# Patient Record
Sex: Female | Born: 1935 | Race: Black or African American | Hispanic: No | Smoking: Never smoker
Health system: Southern US, Community
[De-identification: ages and names within clinical notes are randomized; demographics above are authoritative.]

## PROBLEM LIST (undated history)

## (undated) DIAGNOSIS — E559 Vitamin D deficiency, unspecified: Secondary | ICD-10-CM

## (undated) DIAGNOSIS — K219 Gastro-esophageal reflux disease without esophagitis: Secondary | ICD-10-CM

## (undated) DIAGNOSIS — T8859XA Other complications of anesthesia, initial encounter: Secondary | ICD-10-CM

## (undated) DIAGNOSIS — I951 Orthostatic hypotension: Secondary | ICD-10-CM

## (undated) DIAGNOSIS — Z8639 Personal history of other endocrine, nutritional and metabolic disease: Secondary | ICD-10-CM

## (undated) DIAGNOSIS — I10 Essential (primary) hypertension: Secondary | ICD-10-CM

## (undated) DIAGNOSIS — D691 Qualitative platelet defects: Secondary | ICD-10-CM

## (undated) DIAGNOSIS — R634 Abnormal weight loss: Secondary | ICD-10-CM

## (undated) DIAGNOSIS — T4145XA Adverse effect of unspecified anesthetic, initial encounter: Secondary | ICD-10-CM

## (undated) DIAGNOSIS — K4090 Unilateral inguinal hernia, without obstruction or gangrene, not specified as recurrent: Secondary | ICD-10-CM

## (undated) DIAGNOSIS — R112 Nausea with vomiting, unspecified: Secondary | ICD-10-CM

## (undated) DIAGNOSIS — R413 Other amnesia: Secondary | ICD-10-CM

## (undated) DIAGNOSIS — Z9889 Other specified postprocedural states: Secondary | ICD-10-CM

## (undated) DIAGNOSIS — L089 Local infection of the skin and subcutaneous tissue, unspecified: Secondary | ICD-10-CM

## (undated) DIAGNOSIS — G250 Essential tremor: Secondary | ICD-10-CM

## (undated) DIAGNOSIS — D649 Anemia, unspecified: Secondary | ICD-10-CM

## (undated) DIAGNOSIS — M1990 Unspecified osteoarthritis, unspecified site: Secondary | ICD-10-CM

## (undated) DIAGNOSIS — D573 Sickle-cell trait: Secondary | ICD-10-CM

## (undated) HISTORY — DX: Abnormal weight loss: R63.4

## (undated) HISTORY — PX: BLADDER SUSPENSION: SHX72

## (undated) HISTORY — PX: JOINT REPLACEMENT: SHX530

## (undated) HISTORY — DX: Sickle-cell trait: D57.3

## (undated) HISTORY — DX: Personal history of other endocrine, nutritional and metabolic disease: Z86.39

## (undated) HISTORY — DX: Essential tremor: G25.0

## (undated) HISTORY — DX: Orthostatic hypotension: I95.1

## (undated) HISTORY — DX: Anemia, unspecified: D64.9

## (undated) HISTORY — DX: Other amnesia: R41.3

## (undated) HISTORY — DX: Local infection of the skin and subcutaneous tissue, unspecified: L08.9

## (undated) HISTORY — PX: EYE SURGERY: SHX253

## (undated) HISTORY — PX: BUNIONECTOMY: SHX129

## (undated) HISTORY — DX: Essential (primary) hypertension: I10

## (undated) HISTORY — PX: ABDOMINAL HYSTERECTOMY: SHX81

## (undated) HISTORY — DX: Vitamin D deficiency, unspecified: E55.9

## (undated) HISTORY — DX: Qualitative platelet defects: D69.1

---

## 2013-09-16 ENCOUNTER — Ambulatory Visit: Payer: Self-pay

## 2013-10-03 ENCOUNTER — Ambulatory Visit (INDEPENDENT_AMBULATORY_CARE_PROVIDER_SITE_OTHER): Payer: BC Managed Care – PPO

## 2013-10-03 VITALS — BP 156/96 | HR 67 | Resp 12 | Ht 66.0 in | Wt 150.0 lb

## 2013-10-03 DIAGNOSIS — B351 Tinea unguium: Secondary | ICD-10-CM

## 2013-10-03 DIAGNOSIS — M2040 Other hammer toe(s) (acquired), unspecified foot: Secondary | ICD-10-CM

## 2013-10-03 DIAGNOSIS — M79609 Pain in unspecified limb: Secondary | ICD-10-CM

## 2013-10-03 NOTE — Progress Notes (Signed)
Subjective:    Patient ID: Sheri Brown, female    DOB: 1936/10/23, 77 y.o.   MRN: [id number]  HPI Comments: '' TRIM TOENAILS AND CHECK MY FEET''     Review of Systems  Endocrine: Positive for cold intolerance.  Genitourinary: Positive for frequency.  Musculoskeletal: Positive for back pain.  Psychiatric/Behavioral: The patient is nervous/anxious.        Objective:   Physical Exam Neurovascular status is intact pedal pulses palpable DP postal for PT one over 4 bilateral Refill time 3 seconds all digits neurologically epicritic and proprioceptive sensations intact and symmetric bilateral normal plantar response and DTRs noted. Dermatologically skin color pigment normal hair growth absent bilateral. Nails extremely thick brittle and friable and discolored nails have not been cut possibly as much as a year to two-year is there over an inch in length brittle friable and criptotic and at she painful and tender both on palpation and attempted debridement at this time. Hallux through fifth digits bilateral consistent with severe onychomycosis and crepitus is ingrowing nails painful tender and deformed. Patient start these had bunionectomy both great toes over has residual lateral deviation of the hallux mild flexible digital contractures noted 2 through 5 bilateral       Assessment & Plan:  Assessment this time is significant onychomycosis and deformity of nails. Painful in systematic 1 through 5 bilateral. Nails painful cement debrided x10 recommend topical antifungal such as formula 3 to be applied twice daily to the affected nails for 12 months duration. Reappointed 3 months for continued palliative care as recommended patient's will also maintain appropriate coming shoe gear in the future. No secondary infection is noted at this time  Alvan Dame DPM

## 2013-10-03 NOTE — Patient Instructions (Signed)
Onychomycosis/Fungal Toenails  WHAT IS IT? An infection that lies within the keratin of your nail plate that is caused by a fungus.  WHY ME? Fungal infections affect all ages, sexes, races, and creeds.  There may be many factors that predispose you to a fungal infection such as age, coexisting medical conditions such as diabetes, or an autoimmune disease; stress, medications, fatigue, genetics, etc.  Bottom line: fungus thrives in a warm, moist environment and your shoes offer such a location.  IS IT CONTAGIOUS? Theoretically, yes.  You do not want to share shoes, nail clippers or files with someone who has fungal toenails.  Walking around barefoot in the same room or sleeping in the same bed is unlikely to transfer the organism.  It is important to realize, however, that fungus can spread easily from one nail to the next on the same foot.  HOW DO WE TREAT THIS?  There are several ways to treat this condition.  Treatment may depend on many factors such as age, medications, pregnancy, liver and kidney conditions, etc.  It is best to ask your doctor which options are available to you.  1. No treatment.   Unlike many other medical concerns, you can live with this condition.  However for many people this can be a painful condition and may lead to ingrown toenails or a bacterial infection.  It is recommended that you keep the nails cut short to help reduce the amount of fungal nail. 2. Topical treatment.  These range from herbal remedies to prescription strength nail lacquers.  About 40-50% effective, topicals require twice daily application for approximately 9 to 12 months or until an entirely new nail has grown out.  The most effective topicals are medical grade medications available through physicians offices. 3. Oral antifungal medications.  With an 80-90% cure rate, the most common oral medication requires 3 to 4 months of therapy and stays in your system for a year as the new nail grows out.  Oral  antifungal medications do require blood work to make sure it is a safe drug for you.  A liver function panel will be performed prior to starting the medication and after the first month of treatment.  It is important to have the blood work performed to avoid any harmful side effects.  In general, this medication safe but blood work is required. 4. Laser Therapy.  This treatment is performed by applying a specialized laser to the affected nail plate.  This therapy is noninvasive, fast, and non-painful.  It is not covered by insurance and is therefore, out of pocket.  The results have been very good with a 80-95% cure rate.  The [facility] is the only practice in the area to offer this therapy. 5. Permanent Nail Avulsion.  Removing the entire nail so that a new nail will not grow back. 6. Fungi-Nail to be obtained over-the-counter. Apply twice daily to affected nails for a 12 months. Minimum 7. Formula 3 as an alternative that can be purchased at the office [facility] also to be applied twice daily to the affected nails for 12 months.

## 2013-10-14 DIAGNOSIS — B351 Tinea unguium: Secondary | ICD-10-CM

## 2013-12-12 ENCOUNTER — Ambulatory Visit: Payer: Medicare Other

## 2014-10-30 ENCOUNTER — Telehealth: Payer: Self-pay | Admitting: *Deleted

## 2014-10-30 NOTE — Telephone Encounter (Signed)
Patient called and wanted someone to call her back.

## 2014-11-05 NOTE — Telephone Encounter (Signed)
Left message to call back. Sheri StanleyLisa cox

## 2015-02-16 ENCOUNTER — Ambulatory Visit: Payer: Medicare Other

## 2015-03-11 ENCOUNTER — Ambulatory Visit: Payer: Medicare Other | Admitting: Podiatrist

## 2015-03-12 ENCOUNTER — Ambulatory Visit: Payer: Medicare Other

## 2015-03-12 DIAGNOSIS — B351 Tinea unguium: Secondary | ICD-10-CM

## 2015-03-12 DIAGNOSIS — M79673 Pain in unspecified foot: Secondary | ICD-10-CM

## 2015-03-12 NOTE — Progress Notes (Signed)
Subjective:    Patient ID: Sheri Brown, female    DOB: 17-Jun-1936, 79 y.o.   MRN: [id number]  HPI Comments: '' TRIM TOENAILS AND CHECK MY FEET''     Review of Systems  Endocrine: Positive for cold intolerance.  Genitourinary: Positive for frequency.  Musculoskeletal: Positive for back pain.  Psychiatric/Behavioral: The patient is nervous/anxious.        Objective:   Physical Exam Neurovascular status is intact pedal pulses palpable DP postal for PT one over 4 bilateral Refill time 3 seconds all digits neurologically epicritic and proprioceptive sensations intact and symmetric bilateral normal plantar response and DTRs noted. Dermatologically skin color pigment normal hair growth absent bilateral. Nails extremely thick brittle and friable and discolored nails have not been cut possibly as much as a year to two-year is there over an inch in length brittle friable and criptotic and at she painful and tender both on palpation and attempted debridement at this time. Hallux through fifth digits bilateral consistent with severe onychomycosis and crepitus is ingrowing nails painful tender and deformed. Patient start these had bunionectomy both great toes over has residual lateral deviation of the hallux mild flexible digital contractures noted 2 through 5 bilateral       Assessment & Plan:  Assessment this time is significant onychomycosis and deformity of nails. Painful in systematic 1 through 5 bilateral. Nails painful cement debrided x10 recommend topical antifungal such as formula 3 to be applied twice daily to the affected nails for 12 months duration. Reappointed 3 months for continued palliative care as recommended patient's will also maintain appropriate coming shoe gear in the future. No secondary infection is noted at this time

## 2015-06-24 ENCOUNTER — Ambulatory Visit: Payer: Medicare Other | Admitting: Podiatry

## 2015-08-31 ENCOUNTER — Ambulatory Visit: Payer: Medicare Other | Admitting: Podiatry

## 2015-09-07 ENCOUNTER — Ambulatory Visit: Payer: Medicare Other | Admitting: Podiatry

## 2015-09-10 ENCOUNTER — Ambulatory Visit (INDEPENDENT_AMBULATORY_CARE_PROVIDER_SITE_OTHER): Payer: Medicare Other | Admitting: Podiatry

## 2015-09-10 ENCOUNTER — Encounter: Payer: Self-pay | Admitting: Podiatry

## 2015-09-10 DIAGNOSIS — L84 Corns and callosities: Secondary | ICD-10-CM | POA: Diagnosis not present

## 2015-09-10 DIAGNOSIS — B351 Tinea unguium: Secondary | ICD-10-CM | POA: Diagnosis not present

## 2015-09-10 DIAGNOSIS — M79673 Pain in unspecified foot: Secondary | ICD-10-CM

## 2015-09-10 NOTE — Progress Notes (Signed)
Patient ID: Bernerd LimboLottie Cashman, female   DOB: [birth year], 79 y.o.   MRN: [id number] Complaint:  Visit Type: Patient returns to my office for continued preventative foot care services. Complaint: Patient states" my nails have grown long and thick and become painful to walk and wear shoes" . The patient presents for preventative foot care services. No changes to ROS.  She has painful corn on the tip of her second toe right foot.  Podiatric Exam: Vascular: dorsalis pedis and posterior tibial pulses are palpable bilateral. Capillary return is immediate. Temperature gradient is WNL. Skin turgor WNL  Sensorium: Normal Semmes Weinstein monofilament test. Normal tactile sensation bilaterally. Nail Exam: Pt has thick disfigured discolored nails with subungual debris noted bilateral entire nail hallux through fifth toenails Ulcer Exam: There is no evidence of ulcer or pre-ulcerative changes or infection. Orthopedic Exam: Muscle tone and strength are WNL. No limitations in general ROM. No crepitus or effusions noted. Foot type and digits show no abnormalities. Bony prominences are unremarkable. Skin: No Porokeratosis. No infection or ulcers.  Distal clavi second toe right foot.  Diagnosis:  Onychomycosis, , Pain in right toe, pain in left toes. Clavi  Treatment & Plan Procedures and Treatment: Consent by patient was obtained for treatment procedures. The patient understood the discussion of treatment and procedures well. All questions were answered thoroughly reviewed. Debridement of mycotic and hypertrophic toenails, 1 through 5 bilateral and clearing of subungual debris. No ulceration, no infection noted. Debride clavi.   Patient states that she has sensitive feet due to previous bunion surgery both feet.  She was very vocally responsive every time her nails were debrided and asked me not to use the dremel. She also has not been seen for six months. Return Visit-Office Procedure: Patient instructed to return to the  office for a follow up visit 3 months for continued evaluation and treatment.

## 2015-12-01 ENCOUNTER — Ambulatory Visit: Payer: Medicare Other | Admitting: Podiatry

## 2015-12-08 ENCOUNTER — Ambulatory Visit (INDEPENDENT_AMBULATORY_CARE_PROVIDER_SITE_OTHER): Payer: Medicare Other | Admitting: Podiatry

## 2015-12-08 ENCOUNTER — Encounter: Payer: Self-pay | Admitting: Podiatry

## 2015-12-08 DIAGNOSIS — B351 Tinea unguium: Secondary | ICD-10-CM

## 2015-12-08 DIAGNOSIS — M79673 Pain in unspecified foot: Secondary | ICD-10-CM

## 2015-12-08 NOTE — Progress Notes (Signed)
Subjective:     Patient ID: Sheri Brown, female   DOB: 07-23-1936, 80 y.o.   MRN: [id number]  HPI this patient returns to the office for continued preventative foot care services. Her nails are extremely long, thick and painful. She experiences pain walking and wearing her shoes. She has had a history of bilateral bunion correction. She says she would like her nails cut without the use of the dremel tool.     Review of Systems     Objective:   Physical Exam Podiatric Exam: Vascular: dorsalis pedis and posterior tibial pulses are palpable bilateral. Capillary return is immediate. Temperature gradient is WNL. Skin turgor WNL  Sensorium: Normal Semmes Weinstein monofilament test. Normal tactile sensation bilaterally. Nail Exam: Pt has thick disfigured discolored nails with subungual debris noted bilateral entire nail hallux through fifth toenails Ulcer Exam: There is no evidence of ulcer or pre-ulcerative changes or infection. Orthopedic Exam: Muscle tone and strength are WNL. No limitations in general ROM. No crepitus or effusions noted. Foot type and digits show no abnormalities. Bony prominences are unremarkable. Skin: No Porokeratosis. No infection or ulcers. Distal clavi second toe right foot.     Assessment:     Onychomycosis B/L     Plan:     ROV  Discussed her nail with her.  I told her that without the use of the dremel tool I would not be able to do a good job.  I suggested she consider permanent removal of her mycotic nails and she thought that was a good idea.  I will seek a doctor to perform this surgery.  Helane Gunther DPM

## 2016-01-17 ENCOUNTER — Encounter: Payer: Self-pay | Admitting: Sports Medicine

## 2016-01-17 ENCOUNTER — Ambulatory Visit (INDEPENDENT_AMBULATORY_CARE_PROVIDER_SITE_OTHER): Payer: Medicare Other | Admitting: Sports Medicine

## 2016-01-17 DIAGNOSIS — M2040 Other hammer toe(s) (acquired), unspecified foot: Secondary | ICD-10-CM

## 2016-01-17 DIAGNOSIS — L84 Corns and callosities: Secondary | ICD-10-CM | POA: Diagnosis not present

## 2016-01-17 DIAGNOSIS — B351 Tinea unguium: Secondary | ICD-10-CM

## 2016-01-17 DIAGNOSIS — M21619 Bunion of unspecified foot: Secondary | ICD-10-CM

## 2016-01-17 DIAGNOSIS — M79673 Pain in unspecified foot: Secondary | ICD-10-CM

## 2016-01-17 NOTE — Progress Notes (Signed)
Patient ID: Sheri Brown, female   DOB: 1936-07-19, 80 y.o.   MRN: [id number] Subjective: Sheri Brown is a 80 y.o. female patient seen today in office with complaint of painful thickened and elongated toenails; unable to trim. Patient denies history of Diabetes, Neuropathy, or known Vascular disease. Patient states that she wants to discuss her nails; states that during last office visit the last doctor she saw was very dismissive and did not want to take the time to trim her nails because she was very sensitive; patient states that she does NOT want surgery. States her nails are only painful as they grow out and would like conservative treatment only. Patient has no other pedal complaints at this time.   There are no active problems to display for this patient.   Current Outpatient Prescriptions on File Prior to Visit  Medication Sig Dispense Refill  . metoprolol tartrate (LOPRESSOR) 25 MG tablet Take 25 mg by mouth 2 (two) times daily.     No current facility-administered medications on file prior to visit.    Allergies  Allergen Reactions  . Gluten Meal     Objective: Physical Exam  General: Well developed, nourished, no acute distress, awake, alert and oriented x 3  Vascular: Dorsalis pedis artery 2/4 bilateral, Posterior tibial artery 1/4 bilateral, skin temperature warm to warm proximal to distal bilateral lower extremities, no varicosities, pedal hair present bilateral.  Neurological: Gross sensation present via light touch bilateral.   Dermatological: Skin is warm, dry, and supple bilateral, Nails 1-10 are tender, long, thick, and discolored with moderate to severe subungal debris and discoloration, no webspace macerations present bilateral, no open lesions present bilateral, + mild hyperkeratotic tissue present at right 2nd toe distal tuft. No signs of infection bilateral.  Musculoskeletal: Hammertoes and Residual bunion deformities noted bilateral; had bunionectomy 20 years ago.  Muscular strength within normal limits without pain on range of motion. No pain with calf compression bilateral.  Assessment and Plan:  Problem List Items Addressed This Visit    None    Visit Diagnoses    Onychomycosis    -  Primary    Clavi        right 2nd toe    Foot pain, unspecified laterality        Bunion        Hammer toe, unspecified laterality           -Examined patient.  -Discussed treatment options for painful mycotic nails and clavi. -Recommend good supportive shoes and inserts for foot type and padding to right 2nd toe as needed -Patient does NOT want surgery at this time. States that she only wants her nails trimmed however wants her next nail trim to be in a few weeks not at this visit as offered -Patient is interested in nail softners; recommended epsom salt soaks and vicks/tea tree oil as needed to nails -Patient to return when she is ready for nail trim or sooner if symptoms worsen.  [name], DPM

## 2016-03-06 DIAGNOSIS — I10 Essential (primary) hypertension: Secondary | ICD-10-CM

## 2016-03-06 DIAGNOSIS — D649 Anemia, unspecified: Secondary | ICD-10-CM | POA: Insufficient documentation

## 2016-03-06 DIAGNOSIS — R42 Dizziness and giddiness: Secondary | ICD-10-CM | POA: Insufficient documentation

## 2016-03-06 DIAGNOSIS — Z8639 Personal history of other endocrine, nutritional and metabolic disease: Secondary | ICD-10-CM | POA: Insufficient documentation

## 2016-03-06 HISTORY — DX: Essential (primary) hypertension: I10

## 2016-06-12 ENCOUNTER — Other Ambulatory Visit: Payer: Self-pay | Admitting: Cardiology

## 2016-06-12 ENCOUNTER — Ambulatory Visit
Admission: RE | Admit: 2016-06-12 | Discharge: 2016-06-12 | Disposition: A | Discharge status: Home / Self Care | Payer: Medicare Other | Source: Ambulatory Visit | Attending: Cardiology | Admitting: Cardiology

## 2016-06-12 DIAGNOSIS — R0602 Shortness of breath: Secondary | ICD-10-CM

## 2016-08-07 DIAGNOSIS — R0602 Shortness of breath: Secondary | ICD-10-CM | POA: Diagnosis present

## 2016-08-07 NOTE — H&P (Signed)
OFFICE VISIT NOTES COPIED TO EPIC FOR DOCUMENTATION  . History of Present Illness Sheri Brown AGNP-C; 07-13-16 1:36 PM) Patient words: FU nuc, echo, chest xray; Last OV 06/05/2016.  The patient is a 80 year old female who presents for a Follow-up for Shortness of breath. She has a history of hypertension. She presented for evaluation of worsening dyspnea on exertion and lower extremity edema over the past several months. She denies any chest pain, PND, orthopnea, dizziness, syncope, or symptoms suggestive of claudication or TIA. No history of diabetes, hyperlipidemia, thyroid disease, or tobacco use, however she had a significant history of secondhand tobacco exposure.  She was scheduled for echocardiogram and nuclear stress test and presents today for follow up.   Problem List/Past Medical Sheri Brown; 07-13-16 1:00 PM) Benign essential hypertension (I10)  Orthopnea (R06.01)  Shortness of breath on exertion (R06.02)  Chest x-ray 06/12/2016: No active cardiopulmonary disease. Lower extremity edema (R60.0)  Abnormal EKG (R94.31)  Anemia (D64.9)  Sickle cell trait (D57.3)   Allergies Sheri Brown; 07/13/2016 1:00 PM) No Known Drug Allergies 06/05/2016  Family History Sheri Brown; 07-13-2016 1:00 PM) Mother  Deceased. at age 42 from heart attacks; no heart attacks or strokes, no known cardiovascular conditions Father  Deceased. in his 8s from cancer; no heart attacks or strokes, no cardiovascular conditions Brother 1  In good health. 3 1/2 yrs younger; no cardiovascular conditions  Social History Sheri Brown; 07-13-16 1:00 PM) Number of Children  3. Living Situation  Lives alone. Marital status  Widowed. Non Drinker/No Alcohol Use  Current tobacco use  Never smoker.  Past Surgical History Sheri Brown; July 13, 2016 1:00 PM) Nodule Removed from Breast 1961 Wisdom Tooth 1964 Total Hip Replacement - Right 2011 Bilateral  Bunionectomy 2011  Medication History Sheri Brown; 2016/07/13 1:06 PM) Metoprolol Succinate ER (25MG  Tablet ER 24HR, 1/2 Oral daily for tremors) Active. Ferrous Sulfate (325 (65 Fe)MG Tablet, 1 Oral daily) Active. Multivitamin/Iron (1 Oral daily) Active. Medications Reconciled (verbally)  Diagnostic Studies History Sheri Pert, MD; 13-Jul-2016 1:18 PM) Echocardiogram 06/21/2016 1. Left ventricle cavity is normal in size. Normal global wall motion. Normal diastolic filling pattern. Calculated EF 63%. 2. Right atrial cavity is mildly dilated. 3. Right ventricle cavity is borderline dilated. Normal right ventricular function. 4. Mild (Grade I) aortic regurgitation. 5. Mild (Grade I) mitral regurgitation. 6. Mild tricuspid regurgitation. Moderate pulmonary hypertension. PASP 46 mm Hg. 7. IVC is dilated with blunted respiratory response. Suggests elevated central venous pressure Nuclear stress test 06/16/2016 1. The resting electrocardiogram demonstrated normal sinus rhythm, normal resting conduction, no resting arrhythmias and nonspecific T changes. Stress EKG is equivocal for ischemia with Lexiscan infusion, patient developed 1 mm ST segment depression in inferior and lateral leads at 1 minute into recovery.stress symptoms included dizziness. 2. The raw images reveal prominent soft tissue evaluation in the inferior wall. There is no e/o ischemia. The left ventricle is very small both in rest and stress images at 78 mL. The ventricular systolic function calculated by QGS was depressed at 38% however visually appears to be normal. This is an intermediate risk study, clinical correlation recommended. Labwork  05/17/2016: Total cholesterol 121, triglycerides 54, HDL 44, direct LDL 69, creatinine 0.75, potassium 3.8, CMP normal, Hb 10.1/HCT 30.1 with normocytic indices Sleep Study 2011 Normal. Colonoscopy 2011    Review of Systems [facility] Sheri Brown, [place]; 2016-07-13 1:36 PM) General  Not Present- Anorexia, Fatigue and Fever. Respiratory Present- Decreased Exercise Tolerance and Difficulty Breathing on Exertion. Not Present- Cough. Cardiovascular  Present- Edema and Orthopnea. Not Present- Chest Pain, Claudications, Palpitations and Paroxysmal Nocturnal Dyspnea. Gastrointestinal Not Present- Black, Tarry Stool, Change in Bowel Habits and Nausea. Neurological Not Present- Focal Neurological Symptoms and Syncope. Endocrine Not Present- Cold Intolerance, Excessive Sweating, Heat Intolerance and Thyroid Problems. Hematology Not Present- Anemia, Easy Bruising, Petechiae and Prolonged Bleeding.  Vitals Sheri Brown(Sheri Brown; 06/28/2016 1:08 PM) 06/28/2016 1:03 PM Weight: 130.44 lb Height: 63in Body Surface Area: 1.61 m Body Mass Index: 23.11 kg/m  Pulse: 82 (Regular)  P.OX: 97% (Room air) BP: 90/50 (Sitting, Left Arm, Brown)       Physical Exam (Sheri Brown, AGNP-C; 06/28/2016 1:36 PM) General Mental Status-Alert. General Appearance-Cooperative and Appears stated age. Build & Nutrition-Moderately built.  Head and Neck Thyroid Gland Characteristics - normal size and consistency and no palpable nodules.  Chest and Lung Exam Chest and lung exam reveals -quiet, even and easy respiratory effort with no use of accessory muscles, non-tender and on auscultation, normal breath sounds, no adventitious sounds.  Cardiovascular Cardiovascular examination reveals -normal heart sounds, regular rate and rhythm with no murmurs, carotid auscultation reveals no bruits and abdominal aorta auscultation reveals no bruits and no prominent pulsation.  Abdomen Palpation/Percussion Normal exam - Non Tender and No hepatosplenomegaly.  Peripheral Vascular Lower Extremity Inspection - Bilateral - Varicose veins. Palpation - Edema - Bilateral - 1+ Pitting edema. Femoral pulse - Bilateral - Normal. Popliteal pulse - Bilateral - Feeble. Dorsalis pedis pulse - Bilateral -  Normal. Posterior tibial pulse - Bilateral - Feeble.  Neurologic Neurologic evaluation reveals -alert and oriented x 3 with no impairment of recent or remote memory. Motor-Grossly intact without any focal deficits.  Musculoskeletal Global Assessment Left Lower Extremity - no deformities, masses or tenderness, no known fractures. Right Lower Extremity - no deformities, masses or tenderness, no known fractures.    Assessment & Plan (Sheri Allison AGNP-C; 06/28/2016 1:32 PM) Pulmonary hypertension (I27.2) Current Plans Complete electrocardiogram (93000) Shortness of breath on exertion (R06.02) Story: Chest x-ray 06/12/2016: No active cardiopulmonary disease. Impression: EKG 06/05/2016: Sinus rhythm at a rate of 60 bpm, normal axis, normal intervals, diffuse T-wave abnormality, cannot exclude anterior ischemia. Orthopnea (R06.01) Lower extremity edema (R60.0) Current Plans Mechanism of underlying disease process and action of medications discussed with the patient. I discussed primary/secondary prevention. She presents for follow-up of nuclear stress test and echocardiogram due to dyspnea. Stress test was negative for evidence of ischemia, however echocardiogram reveals pulmonary hypertension with RVSP of 46 mmHg. Unable to treat with amlodipine or isosorbide due to baseline hypotension. Will schedule for right heart catheterization for definitive evaluation of right heart pressures and see her back after this for further recommendations.   Signed by Sheri SalvoBridgette Allison, AGNP-C (06/28/2016 1:37 PM)

## 2016-08-08 ENCOUNTER — Ambulatory Visit (HOSPITAL_COMMUNITY)
Admission: RE | Admit: 2016-08-08 | Discharge: 2016-08-08 | Disposition: A | Discharge status: Home / Self Care | Payer: Medicare Other | Source: Ambulatory Visit | Attending: Cardiology | Admitting: Cardiology

## 2016-08-08 ENCOUNTER — Encounter (HOSPITAL_COMMUNITY): Payer: Self-pay | Admitting: Cardiology

## 2016-08-08 ENCOUNTER — Encounter (HOSPITAL_COMMUNITY)
Admission: RE | Disposition: A | Discharge status: Home / Self Care | Payer: Self-pay | Source: Ambulatory Visit | Attending: Cardiology

## 2016-08-08 DIAGNOSIS — R0601 Orthopnea: Secondary | ICD-10-CM | POA: Diagnosis not present

## 2016-08-08 DIAGNOSIS — I10 Essential (primary) hypertension: Secondary | ICD-10-CM | POA: Diagnosis not present

## 2016-08-08 DIAGNOSIS — R0602 Shortness of breath: Secondary | ICD-10-CM | POA: Diagnosis present

## 2016-08-08 DIAGNOSIS — Z8249 Family history of ischemic heart disease and other diseases of the circulatory system: Secondary | ICD-10-CM | POA: Insufficient documentation

## 2016-08-08 DIAGNOSIS — R9431 Abnormal electrocardiogram [ECG] [EKG]: Secondary | ICD-10-CM | POA: Insufficient documentation

## 2016-08-08 DIAGNOSIS — D573 Sickle-cell trait: Secondary | ICD-10-CM | POA: Diagnosis not present

## 2016-08-08 DIAGNOSIS — R0600 Dyspnea, unspecified: Secondary | ICD-10-CM | POA: Insufficient documentation

## 2016-08-08 DIAGNOSIS — R600 Localized edema: Secondary | ICD-10-CM | POA: Diagnosis not present

## 2016-08-08 DIAGNOSIS — Z96641 Presence of right artificial hip joint: Secondary | ICD-10-CM | POA: Diagnosis not present

## 2016-08-08 HISTORY — PX: CARDIAC CATHETERIZATION: SHX172

## 2016-08-08 LAB — POCT I-STAT 3, VENOUS BLOOD GAS (G3P V)
BICARBONATE: 24.6 mmol/L (ref 20.0–28.0)
O2 Saturation: 63 %
PH VEN: 7.407 (ref 7.250–7.430)
PO2 VEN: 33 mmHg (ref 32.0–45.0)
TCO2: 26 mmol/L (ref 0–100)
pCO2, Ven: 39.1 mmHg — ABNORMAL LOW (ref 44.0–60.0)

## 2016-08-08 SURGERY — RIGHT HEART CATH

## 2016-08-08 MED ORDER — FENTANYL CITRATE (PF) 100 MCG/2ML IJ SOLN
INTRAMUSCULAR | Status: DC | PRN
  Administered 2016-08-08: 50 ug via INTRAVENOUS

## 2016-08-08 MED ORDER — SODIUM CHLORIDE 0.9% FLUSH: 3.0000 mL | Freq: Two times a day (BID) | INTRAVENOUS | Status: DC

## 2016-08-08 MED ORDER — SODIUM CHLORIDE 0.9 % IV SOLN
INTRAVENOUS | Status: DC
  Administered 2016-08-08: 07:00:00 via INTRAVENOUS

## 2016-08-08 MED ORDER — SODIUM CHLORIDE 0.9% FLUSH: 3.0000 mL | INTRAVENOUS | Status: DC | PRN

## 2016-08-08 MED ORDER — SODIUM CHLORIDE 0.9 % IV SOLN: 250.0000 mL | INTRAVENOUS | Status: DC | PRN

## 2016-08-08 MED ORDER — LABETALOL HCL 5 MG/ML IV SOLN
20.0000 mg | Freq: Once | INTRAVENOUS | Status: AC
  Administered 2016-08-08: 20 mg via INTRAVENOUS

## 2016-08-08 MED ORDER — LABETALOL HCL 5 MG/ML IV SOLN
INTRAVENOUS | Status: AC
  Filled 2016-08-08: qty 4

## 2016-08-08 MED ORDER — ASPIRIN 81 MG PO CHEW
CHEWABLE_TABLET | ORAL | Status: AC
  Administered 2016-08-08: 81 mg
  Filled 2016-08-08: qty 1

## 2016-08-08 MED ORDER — HEPARIN (PORCINE) IN NACL 2-0.9 UNIT/ML-% IJ SOLN
INTRAMUSCULAR | Status: DC | PRN
  Administered 2016-08-08: 500 mL

## 2016-08-08 MED ORDER — FENTANYL CITRATE (PF) 100 MCG/2ML IJ SOLN
INTRAMUSCULAR | Status: AC
  Filled 2016-08-08: qty 2

## 2016-08-08 MED ORDER — LIDOCAINE HCL (PF) 1 % IJ SOLN
INTRAMUSCULAR | Status: DC | PRN
  Administered 2016-08-08: 15 mL
  Administered 2016-08-08: 2 mL

## 2016-08-08 MED ORDER — HEPARIN (PORCINE) IN NACL 2-0.9 UNIT/ML-% IJ SOLN
INTRAMUSCULAR | Status: AC
  Filled 2016-08-08: qty 500

## 2016-08-08 MED ORDER — LIDOCAINE HCL (PF) 1 % IJ SOLN
INTRAMUSCULAR | Status: AC
  Filled 2016-08-08: qty 30

## 2016-08-08 SURGICAL SUPPLY — 11 items
CATH BALLN WEDGE 5F 110CM (CATHETERS) ×2 IMPLANT
CATH SWAN GANZ 7F STRAIGHT (CATHETERS) ×2 IMPLANT
KIT HEART LEFT (KITS) IMPLANT
PACK CARDIAC CATHETERIZATION (CUSTOM PROCEDURE TRAY) ×2 IMPLANT
PROTECTION STATION PRESSURIZED (MISCELLANEOUS) ×2
SHEATH FAST CATH BRACH 5F 5CM (SHEATH) ×2 IMPLANT
SHEATH PINNACLE 7F 10CM (SHEATH) ×2 IMPLANT
STATION PROTECTION PRESSURIZED (MISCELLANEOUS) ×1 IMPLANT
TRANSDUCER W/STOPCOCK (MISCELLANEOUS) ×2 IMPLANT
TUBING ART PRESS 72  MALE/FEM (TUBING) ×1
TUBING ART PRESS 72 MALE/FEM (TUBING) ×1 IMPLANT

## 2016-08-08 NOTE — Progress Notes (Signed)
Site area: RFV Site Prior to Removal:  Level 0 Pressure Applied For:1515min Manual:  yes  Patient Status During Pull: stable  Post Pull Site:  Level 0 Post Pull Instructions Given:yes   Post Pull Pulses Present: palpale Dressing Applied:tegaderm   Bedrest begins @ 0900 till 1000 Comments:

## 2016-08-08 NOTE — Discharge Instructions (Signed)
Angiogram, Care After °Refer to this sheet in the next few weeks. These instructions provide you with information about caring for yourself after your procedure. Your health care provider may also give you more specific instructions. Your treatment has been planned according to current medical practices, but problems sometimes occur. Call your health care provider if you have any problems or questions after your procedure. °WHAT TO EXPECT AFTER THE PROCEDURE °After your procedure, it is typical to have the following: °· Bruising at the catheter insertion site that usually fades within 1-2 weeks. °· Blood collecting in the tissue (hematoma) that may be painful to the touch. It should usually decrease in size and tenderness within 1-2 weeks. °HOME CARE INSTRUCTIONS °· Take medicines only as directed by your health care provider. °· You may shower 24-48 hours after the procedure or as directed by your health care provider. Remove the bandage (dressing) and gently wash the site with plain soap and water. Pat the area dry with a clean towel. Do not rub the site, because this may cause bleeding. °· Do not take baths, swim, or use a hot tub until your health care provider approves. °· Check your insertion site every day for redness, swelling, or drainage. °· Do not apply powder or lotion to the site. °· Do not lift over 10 lb (4.5 kg) for 5 days after your procedure or as directed by your health care provider. °· Ask your health care provider when it is okay to: °¨ Return to work or school. °¨ Resume usual physical activities or sports. °¨ Resume sexual activity. °· Do not drive home if you are discharged the same day as the procedure. Have someone else drive you. °· You may drive 24 hours after the procedure unless otherwise instructed by your health care provider. °· Do not operate machinery or power tools for 24 hours after the procedure or as directed by your health care provider. °· If your procedure was done as an  outpatient procedure, which means that you went home the same day as your procedure, a responsible adult should be with you for the first 24 hours after you arrive home. °· Keep all follow-up visits as directed by your health care provider. This is important. °SEEK MEDICAL CARE IF: °· You have a fever. °· You have chills. °· You have increased bleeding from the catheter insertion site. Hold pressure on the site. °SEEK IMMEDIATE MEDICAL CARE IF: °· You have unusual pain at the catheter insertion site. °· You have redness, warmth, or swelling at the catheter insertion site. °· You have drainage (other than a small amount of blood on the dressing) from the catheter insertion site. °· The catheter insertion site is bleeding, and the bleeding does not stop after 30 minutes of holding steady pressure on the site. °· The area near or just beyond the catheter insertion site becomes pale, cool, tingly, or numb. °  °This information is not intended to replace advice given to you by your health care provider. Make sure you discuss any questions you have with your health care provider. °  °Document Released: 05/25/2005 Document Revised: 11/27/2014 Document Reviewed: 04/09/2013 °Elsevier Interactive Patient Education ©2016 [employer] ° °

## 2016-08-08 NOTE — Interval H&P Note (Signed)
History and Physical Interval Note:  08/08/2016 7:49 AM  Sheri Brown  has presented today for surgery, with the diagnosis of hp  The various methods of treatment have been discussed with the patient and family. After consideration of risks, benefits and other options for treatment, the patient has consented to  Procedure(s): Right Heart Cath (N/A) as a surgical intervention .  The patient's history has been reviewed, patient examined, no change in status, stable for surgery.  I have reviewed the patient's chart and labs.  Questions were answered to the patient's satisfaction.     Yates DecampGANJI, [name]

## 2016-09-19 ENCOUNTER — Encounter: Payer: Self-pay | Admitting: Sports Medicine

## 2016-09-19 ENCOUNTER — Ambulatory Visit (INDEPENDENT_AMBULATORY_CARE_PROVIDER_SITE_OTHER): Payer: Medicare Other | Admitting: Sports Medicine

## 2016-09-19 DIAGNOSIS — M21619 Bunion of unspecified foot: Secondary | ICD-10-CM

## 2016-09-19 DIAGNOSIS — M79675 Pain in left toe(s): Secondary | ICD-10-CM

## 2016-09-19 DIAGNOSIS — B351 Tinea unguium: Secondary | ICD-10-CM

## 2016-09-19 DIAGNOSIS — M79674 Pain in right toe(s): Secondary | ICD-10-CM | POA: Diagnosis not present

## 2016-09-19 DIAGNOSIS — M2040 Other hammer toe(s) (acquired), unspecified foot: Secondary | ICD-10-CM

## 2016-09-19 NOTE — Progress Notes (Signed)
Patient ID: Sheri Brown, female   DOB: 08/24/1936, 80 y.o.   MRN: [id number]  Subjective: Sheri Brown is a 80 y.o. female patient seen today in office with complaint of painful thickened and elongated toenails; unable to trim.  Patient has no other pedal complaints at this time.   Patient Active Problem List   Diagnosis Date Noted  . Shortness of breath 08/07/2016    Current Outpatient Prescriptions on File Prior to Visit  Medication Sig Dispense Refill  . metoprolol succinate (TOPROL-XL) 25 MG 24 hr tablet Take 12.5 mg by mouth every evening.     . Multiple Vitamins-Iron (MULTIVITAMIN/IRON PO) Take 1 tablet by mouth daily.     No current facility-administered medications on file prior to visit.     Allergies  Allergen Reactions  . Diazepam Other (See Comments)    Mental disturbance.  . Gluten Meal Nausea Only and Other (See Comments)    Severe gastric upset-flatulence.    Objective: Physical Exam  General: Well developed, nourished, no acute distress, awake, alert and oriented x 3  Vascular: Dorsalis pedis artery 2/4 bilateral, Posterior tibial artery 1/4 bilateral, skin temperature warm to warm proximal to distal bilateral lower extremities, no varicosities, pedal hair present bilateral.  Neurological: Gross sensation present via light touch bilateral.   Dermatological: Skin is warm, dry, and supple bilateral, Nails 1-10 are tender, extremely long, thick, and discolored with moderate to severe subungal debris and discoloration, no webspace macerations present bilateral, no open lesions present bilateral, + minimal hyperkeratotic tissue present at right 2nd toe distal tuft. No signs of infection bilateral.  Musculoskeletal: Hammertoes and Residual bunion deformities noted bilateral; had bunionectomy 20 years ago. Muscular strength within normal limits without pain on range of motion. No pain with calf compression bilateral.  Assessment and Plan:  Problem List Items Addressed  This Visit    None    Visit Diagnoses    Onychomycosis    -  Primary   Toe pain, bilateral       Bunion       Hammer toe, unspecified laterality          -Examined patient.  -Discussed treatment options for painful mycotic nails -Nails trimmed using sterile nail nipper without incident -Recommend good supportive shoes for foot type -Patient to return in 3 months for nail trim or sooner if symptoms worsen.  Asencion Islamitorya Tuvia Woodrick, DPM

## 2016-12-19 ENCOUNTER — Ambulatory Visit: Payer: Medicare Other | Admitting: Sports Medicine

## 2017-01-02 ENCOUNTER — Ambulatory Visit (INDEPENDENT_AMBULATORY_CARE_PROVIDER_SITE_OTHER): Payer: Medicare Other | Admitting: Sports Medicine

## 2017-01-02 ENCOUNTER — Encounter: Payer: Self-pay | Admitting: Sports Medicine

## 2017-01-02 DIAGNOSIS — B351 Tinea unguium: Secondary | ICD-10-CM | POA: Diagnosis not present

## 2017-01-02 DIAGNOSIS — M79674 Pain in right toe(s): Secondary | ICD-10-CM | POA: Diagnosis not present

## 2017-01-02 DIAGNOSIS — M79675 Pain in left toe(s): Secondary | ICD-10-CM

## 2017-01-02 NOTE — Progress Notes (Signed)
Patient ID: Sheri Brown, female   DOB: 1936-11-11, 81 y.o.   MRN: [id number]  Subjective: Sheri LimboLottie Brown is a 81 y.o. female patient seen today in office with complaint of painful thickened and elongated toenails; unable to trim.  Patient has no other pedal complaints at this time.   Patient Active Problem List   Diagnosis Date Noted  . Shortness of breath 08/07/2016    Current Outpatient Prescriptions on File Prior to Visit  Medication Sig Dispense Refill  . metoprolol succinate (TOPROL-XL) 25 MG 24 hr tablet Take 12.5 mg by mouth every evening.     . Multiple Vitamins-Iron (MULTIVITAMIN/IRON PO) Take 1 tablet by mouth daily.     No current facility-administered medications on file prior to visit.     Allergies  Allergen Reactions  . Diazepam Other (See Comments)    Mental disturbance.  . Gluten Meal Nausea Only and Other (See Comments)    Severe gastric upset-flatulence.    Objective: Physical Exam  General: Well developed, nourished, no acute distress, awake, alert and oriented x 3  Vascular: Dorsalis pedis artery 2/4 bilateral, Posterior tibial artery 1/4 bilateral, skin temperature warm to warm proximal to distal bilateral lower extremities, no varicosities, pedal hair present bilateral.  Neurological: Gross sensation present via light touch bilateral.   Dermatological: Skin is warm, dry, and supple bilateral, Nails 1-10 are tender, extremely long, thick, and discolored with moderate to severe subungal debris and discoloration, no webspace macerations present bilateral, no open lesions present bilateral, + minimal hyperkeratotic tissue present at right 2nd toe distal tuft. No signs of infection bilateral.  Musculoskeletal: Hammertoes and Residual bunion deformities noted bilateral; had bunionectomy years ago. Muscular strength within normal limits without pain on range of motion. No pain with calf compression bilateral.  Assessment and Plan:  Problem List Items Addressed This  Visit    None    Visit Diagnoses    Onychomycosis    -  Primary   Toe pain, bilateral         -Examined patient.  -Discussed treatment options for painful mycotic nails -Nails trimmed using sterile nail nipper without incident -Recommend good supportive shoes for foot type -Patient to return in 3 months for nail trim or sooner if symptoms worsen.  Asencion Islamitorya Derek Huneycutt, DPM

## 2017-04-03 ENCOUNTER — Ambulatory Visit (INDEPENDENT_AMBULATORY_CARE_PROVIDER_SITE_OTHER): Payer: Medicare Other | Admitting: Sports Medicine

## 2017-04-03 ENCOUNTER — Encounter: Payer: Self-pay | Admitting: Sports Medicine

## 2017-04-03 DIAGNOSIS — B351 Tinea unguium: Secondary | ICD-10-CM | POA: Diagnosis not present

## 2017-04-03 DIAGNOSIS — M79675 Pain in left toe(s): Secondary | ICD-10-CM

## 2017-04-03 DIAGNOSIS — M2040 Other hammer toe(s) (acquired), unspecified foot: Secondary | ICD-10-CM

## 2017-04-03 DIAGNOSIS — M79674 Pain in right toe(s): Secondary | ICD-10-CM

## 2017-04-03 DIAGNOSIS — M21619 Bunion of unspecified foot: Secondary | ICD-10-CM

## 2017-04-03 NOTE — Progress Notes (Signed)
Patient ID: Sheri LimboLottie Brown, female   DOB: 1936-04-28, 81 y.o.   MRN: [id number]  Subjective: Sheri Brown is a 81 y.o. female patient seen today in office with complaint of painful thickened and elongated toenails; unable to trim.  Patient has no other pedal complaints at this time.   Patient Active Problem List   Diagnosis Date Noted  . Shortness of breath 08/07/2016    Current Outpatient Prescriptions on File Prior to Visit  Medication Sig Dispense Refill  . metoprolol succinate (TOPROL-XL) 25 MG 24 hr tablet Take 12.5 mg by mouth every evening.     . Multiple Vitamins-Iron (MULTIVITAMIN/IRON PO) Take 1 tablet by mouth daily.     No current facility-administered medications on file prior to visit.     Allergies  Allergen Reactions  . Diazepam Other (See Comments)    Mental disturbance.  . Gluten Meal Nausea Only and Other (See Comments)    Severe gastric upset-flatulence.    Objective: Physical Exam  General: Well developed, nourished, no acute distress, awake, alert and oriented x 3  Vascular: Dorsalis pedis artery 2/4 bilateral, Posterior tibial artery 1/4 bilateral, skin temperature warm to warm proximal to distal bilateral lower extremities, no varicosities, pedal hair present bilateral.  Neurological: Gross sensation present via light touch bilateral.   Dermatological: Skin is warm, dry, and supple bilateral, Nails 1-10 are tender, extremely long, thick, and discolored with moderate to severe subungal debris and discoloration, no webspace macerations present bilateral, no open lesions present bilateral, + Very minimal hyperkeratotic tissue present at right 2nd toe distal tuft. No signs of infection bilateral.  Musculoskeletal: Hammertoes and Residual bunion deformities noted bilateral; had bunionectomy years ago. Muscular strength within normal limits without pain on range of motion. No pain with calf compression bilateral.  Assessment and Plan:  Problem List Items Addressed  This Visit    None    Visit Diagnoses    Onychomycosis    -  Primary   Toe pain, bilateral       Bunion       Hammer toe, unspecified laterality         -Examined patient.  -Discussed treatment options for painful mycotic nails -Nails trimmed using sterile nail nipper without incident; patient does not like her nails to be smooth at all with the Dremel. -Recommend good supportive shoes for foot type -Patient to return in 3 months for nail trim or sooner if symptoms worsen.  Asencion Islamitorya [name], DPM

## 2017-07-03 ENCOUNTER — Ambulatory Visit: Payer: Medicare Other | Admitting: Sports Medicine

## 2017-08-05 IMAGING — CR DG CHEST 2V
2 series · 2 of 2 positions shown · non-contrast
Comparison: None.

CLINICAL DATA: Shortness of breath lying down. Symptoms for a few
weeks.

EXAM:
CHEST  2 VIEW

[w chest pa]
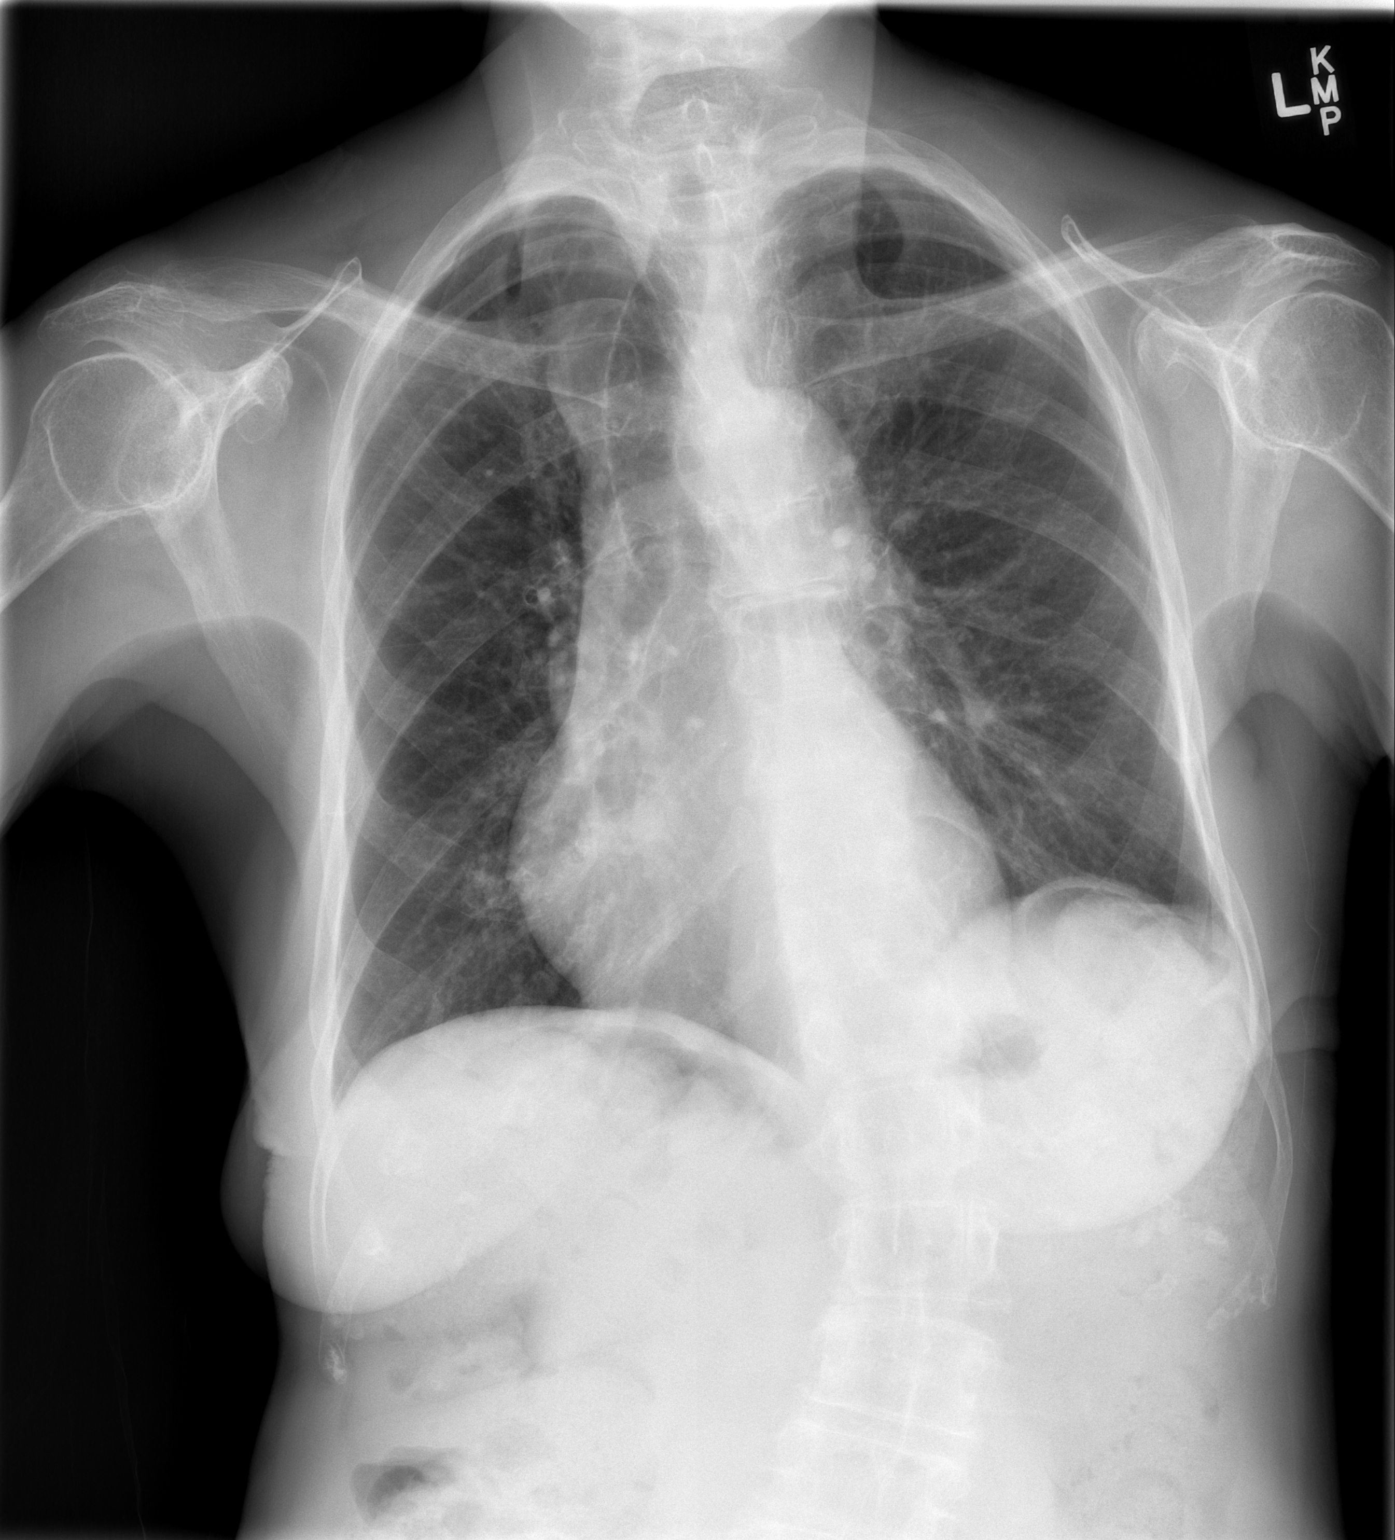

[w chest lat]
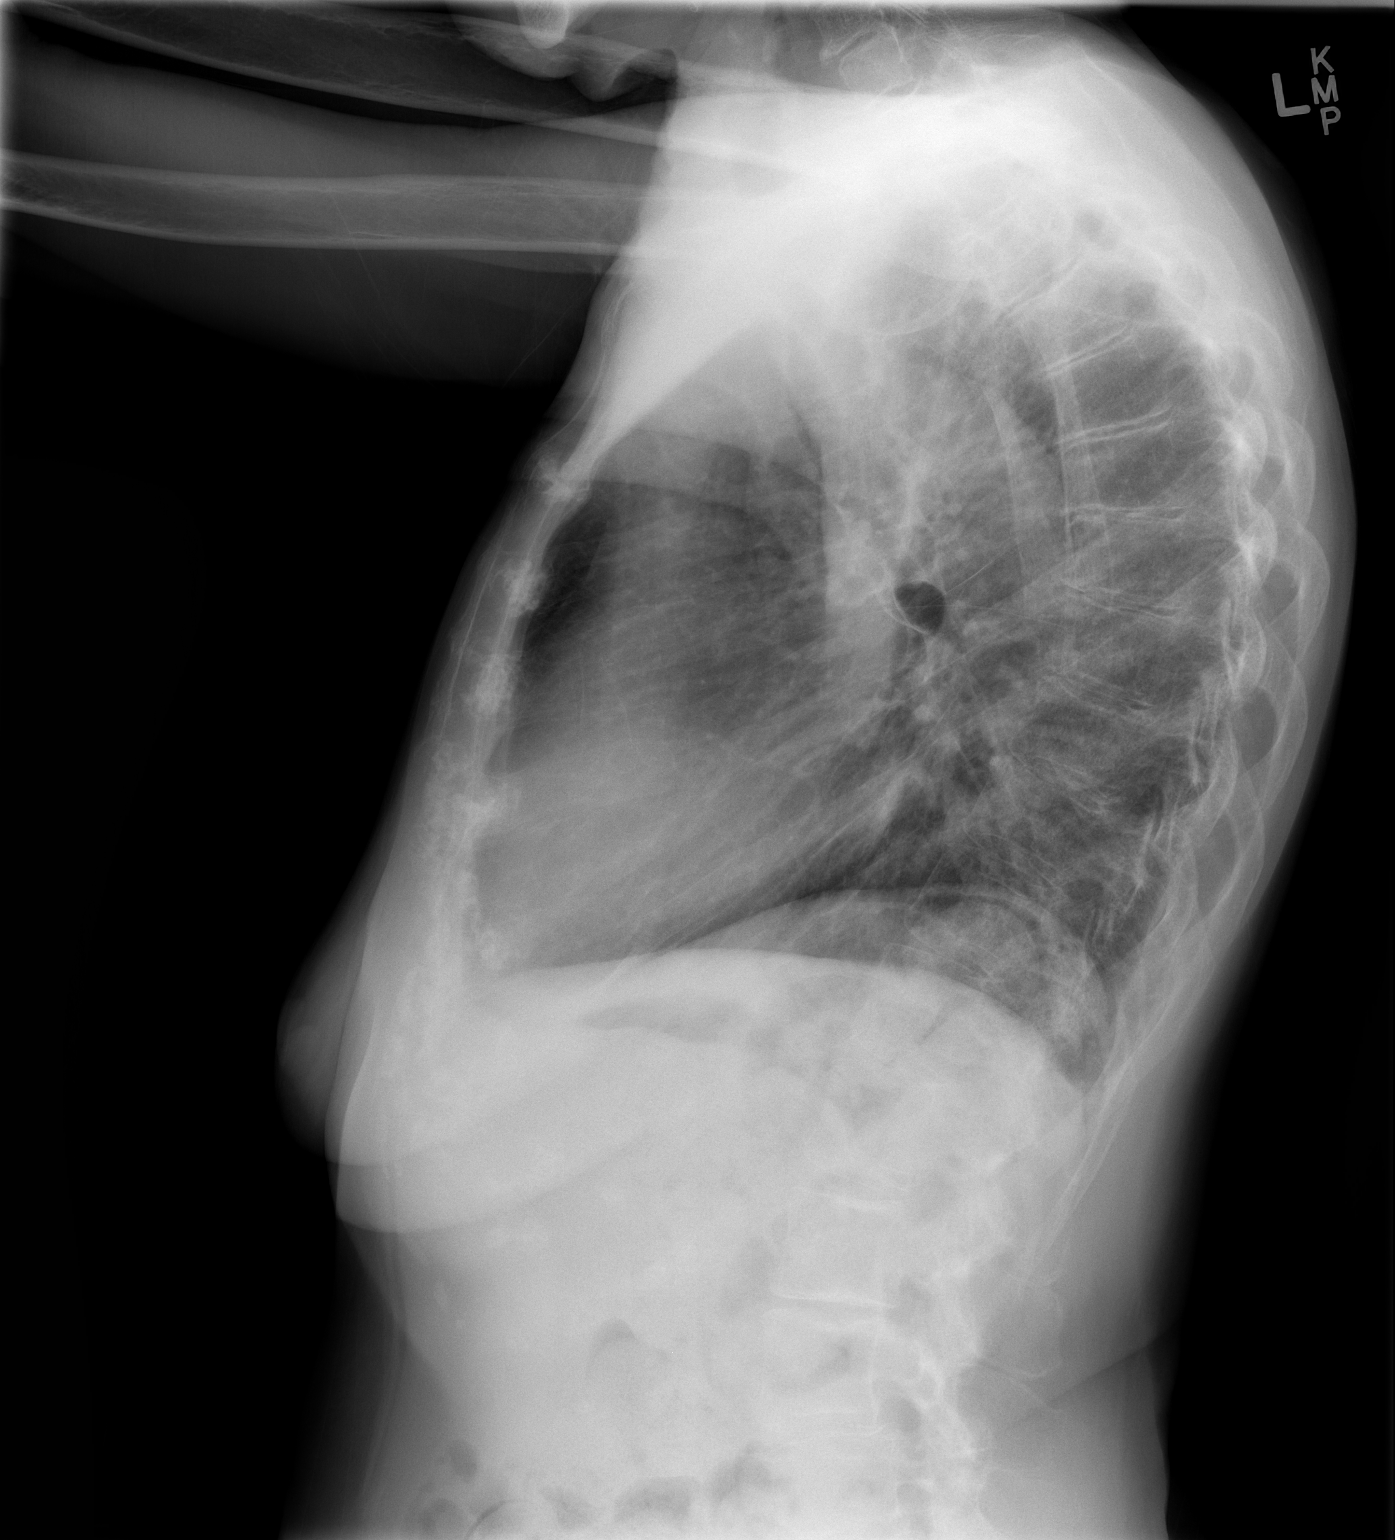

[2 of 2 positions shown; findings below may reference images not displayed]

FINDINGS: Heart and mediastinal contours are within normal limits. No focal
opacities or effusions. No acute bony abnormality. Thoracolumbar
scoliosis.
IMPRESSION: No active cardiopulmonary disease.

## 2017-08-15 ENCOUNTER — Other Ambulatory Visit: Payer: Self-pay | Admitting: Physician Assistant

## 2017-08-15 DIAGNOSIS — M25561 Pain in right knee: Secondary | ICD-10-CM

## 2017-08-23 ENCOUNTER — Other Ambulatory Visit: Payer: Self-pay | Admitting: General Surgery

## 2017-08-24 ENCOUNTER — Ambulatory Visit (INDEPENDENT_AMBULATORY_CARE_PROVIDER_SITE_OTHER): Payer: Medicare Other | Admitting: Neurology

## 2017-08-24 ENCOUNTER — Encounter: Payer: Self-pay | Admitting: Neurology

## 2017-08-24 VITALS — BP 137/85 | HR 76 | Ht 65.0 in | Wt 130.5 lb

## 2017-08-24 DIAGNOSIS — G250 Essential tremor: Secondary | ICD-10-CM

## 2017-08-24 DIAGNOSIS — E538 Deficiency of other specified B group vitamins: Secondary | ICD-10-CM | POA: Diagnosis not present

## 2017-08-24 DIAGNOSIS — R413 Other amnesia: Secondary | ICD-10-CM | POA: Diagnosis not present

## 2017-08-24 HISTORY — DX: Other amnesia: R41.3

## 2017-08-24 HISTORY — DX: Essential tremor: G25.0

## 2017-08-24 MED ORDER — DONEPEZIL HCL 5 MG PO TABS: 5.0000 mg | ORAL_TABLET | Freq: Every day | ORAL | 1 refills | Status: DC

## 2017-08-24 NOTE — Progress Notes (Signed)
Reason for visit: Memory disturbance, tremor  Referring physician: Dr. Tilda Burrow is a 81 y.o. female  History of present illness:  Ms. Glandon is a 81 year old right-handed black female with a history of a memory disturbance that dates back about 3 years. The patient has noted some problems with word finding and with remembering names for people. The patient is operating a motor vehicle, she denies any significant issues with this. The patient lives alone, she does run finances and she is able to keep up with her own medications and appointments. The patient reports that she does not sleep well at night because of urinary frequency. She gets up about 4 times at night to urinate, she is able to get back to sleep but she does have some problem with fatigue during the day and daytime drowsiness. She oftentimes will take at least one nap during the day to help her. She has noted a parallel between the fatigue and the memory to some degree. She indicates that her mother may have had some slight memory problems prior to her death at age 82. The patient has had a tremor affecting both arms over the last 15 years, this has gradually worsened over time. The tremor affects her ability to perform handwriting and to feed herself. The patient indicates that her mother also had a slight tremor and she got older. The patient recently fell and hurt her right knee, she has had a prior right total hip replacement. The patient is sent to this office for further evaluation. She has a mild problem with gait instability, she uses a cane for ambulation. She denies any focal numbness or weakness of the face, arms, or legs.  History reviewed. No pertinent past medical history.  Past Surgical History:  Procedure Laterality Date  . ABDOMINAL HYSTERECTOMY    . BUNIONECTOMY    . CARDIAC CATHETERIZATION N/A 08/08/2016   Procedure: Right Heart Cath;  Surgeon: Yates Decamp, MD;  Location: [facility] INVASIVE CV LAB;  Service:  Cardiovascular;  Laterality: N/A;    Family History  Problem Relation Age of Onset  . Hypertension Mother     Social history:  reports that she has never smoked. She has never used smokeless tobacco. She reports that she does not drink alcohol or use drugs.  Medications:  Prior to Admission medications   Medication Sig Start Date End Date Taking? Authorizing Provider  metoprolol succinate (TOPROL-XL) 25 MG 24 hr tablet Take 12.5 mg by mouth every evening.  12/01/15  Yes [provider]  Multiple Vitamins-Iron (MULTIVITAMIN/IRON PO) Take 1 tablet by mouth daily.   Yes [provider]      Allergies  Allergen Reactions  . Diazepam Other (See Comments)    Mental disturbance.  . Gluten Meal Nausea Only and Other (See Comments)    Severe gastric upset-flatulence.    ROS:  Out of a complete 14 system review of symptoms, the patient complains only of the following symptoms, and all other reviewed systems are negative.  Snoring Memory loss, dizziness, tremor  Blood pressure 137/85, pulse 76, height  (1.651 m), weight 130 lb 8 oz (59.2 kg).  Physical Exam  General: The patient is alert and cooperative at the time of the examination.  Eyes: Pupils are equal, round, and reactive to light. Discs are flat bilaterally.  Neck: The neck is supple, no carotid bruits are noted.  Respiratory: The respiratory examination is clear.  Cardiovascular: The cardiovascular examination reveals a regular  rate and rhythm, no obvious murmurs or rubs are noted.  Skin: Extremities are without significant edema.  Neurologic Exam  Mental status: The patient is alert and oriented x 3 at the time of the examination. The patient has apparent normal recent and remote memory, with an apparently normal attention span and concentration ability. Mini-Mental Status Examination done today shows a total score of 27/30.  Cranial nerves: Facial symmetry is present. There is good sensation  of the face to pinprick and soft touch bilaterally. The strength of the facial muscles and the muscles to head turning and shoulder shrug are normal bilaterally. Speech is well enunciated, no aphasia or dysarthria is noted. Extraocular movements are full. Visual fields are full. The tongue is midline, and the patient has symmetric elevation of the soft palate. No obvious hearing deficits are noted.  Motor: The motor testing reveals 5 over 5 strength of all 4 extremities. Good symmetric motor tone is noted throughout.  Sensory: Sensory testing is intact to pinprick, soft touch, vibration sensation, and position sense on all 4 extremities, with exception of some decrease in pinprick sensation of the left leg as compared to the right. No evidence of extinction is noted.  Coordination: Cerebellar testing reveals good finger-nose-finger and heel-to-shin bilaterally. The patient does have an intention tremor with finger-nose-finger bilaterally. When drawing a spiral, the tremor is translated into the handwriting.  Gait and station: Gait is slightly wide-based gait, medial deviation of both knees is noted. The patient walks with a cane. Tandem gait was not attempted. Romberg is negative. No drift is seen.  Reflexes: Deep tendon reflexes are symmetric, but are depressed bilaterally. Toes are downgoing bilaterally.   Assessment/Plan:  1. Memory disturbance  2. Essential tremor  3. Gait disturbance   The patient appears to have an essential tremor affecting both arms, this has been present for greater than 15 years. She also reports a mild memory problem over the last 3 years, the patient has had difficulty with sleeping with urinary frequency and urgency at night, she has fatigue during the day. This may be an additive factor for her memory issues. The patient will be sent for blood work today, she will have MRI of the brain. Aricept will be started in low dose. She will follow-up in 6 months. If the  Aricept worsens her urinary urgency and frequency, she is to contact her office. Improving the urinary frequency at night may improve the fatigue and memory during the day.  Marlan Palau MD 08/24/2017 12:07 PM  [facility] [address] [place], [place] [address]  Phone [phone] Fax [phone]

## 2017-08-24 NOTE — Patient Instructions (Signed)
    We will check MRI of the brain and get blood work today.  We will start Aricept for the memory.    Begin Aricept (donepezil) at 5 mg at night for one month. If this medication is well-tolerated, please call our office and we will call in a prescription for the 10 mg tablets. Look out for side effects that may include nausea, diarrhea, weight loss, or stomach cramps. This medication will also cause a runny nose, therefore there is no need for allergy medications for this purpose.  

## 2017-08-25 LAB — RPR: RPR: NONREACTIVE

## 2017-08-25 LAB — VITAMIN B12: VITAMIN B 12: 1471 pg/mL — AB (ref 232–1245)

## 2017-08-25 LAB — SEDIMENTATION RATE: Sed Rate: 4 mm/hr (ref 0–40)

## 2017-08-27 ENCOUNTER — Telehealth: Payer: Self-pay | Admitting: *Deleted

## 2017-08-27 NOTE — Telephone Encounter (Signed)
-----   Message from York Spaniel, MD sent at 08/26/2017  4:20 PM EDT -----   The blood work results are unremarkable. Please call the patient.  ----- Message ----- From: [facility] Lab Results In Sent: 08/25/2017   5:41 AM To: York Spaniel, MD

## 2017-08-27 NOTE — Telephone Encounter (Signed)
Called and spoke with patient about unremarkable labs per CW,MD note. Pt verbalized understanding.  

## 2017-08-31 ENCOUNTER — Other Ambulatory Visit: Payer: Medicare Other

## 2017-08-31 ENCOUNTER — Encounter (HOSPITAL_BASED_OUTPATIENT_CLINIC_OR_DEPARTMENT_OTHER): Payer: Self-pay | Admitting: *Deleted

## 2017-09-02 NOTE — H&P (Signed)
Sheri Brown Location: [facility] Patient #: [id number] DOB: [birth year] Widowed / Language: English / Race: Black or African American Female        History of Present Illness        This is a pleasant 81 year old woman, referred by Charlies Silvers, PA at [facility] for evaluation of a symptomatic right inguinal hernia       The patient has no prior history of hernia or abdominal surgery. She says she had a cardiac catheterization through the right groin with negative findings a year ago. She says she's noticed a bulge in her right groin for about a year. This is getting larger and causing some pain but it has never been incarcerated. No gastrointestinal symptoms.      Past history significant for vaginal hysterectomy, right total hip replacement, tremor, hypertension, cataracts, right breast biopsy, cardiac catheter with negative findings.      Family history reveals mother died of old age at 32. Father died of lung cancer in his 61s      Social history reveals she is a widow. Has 3 children one who lives in town. She is retired. Denies alcohol or tobacco. She drives her own car but walks with a cane She would like to have her hernia repaired. She wants to do this in November after she gets back from a trip to [place] with family members. I told her that was reasonable unless symptoms progress       She'll be scheduled for open repair of right inguinal hernia with mesh. I discussed the indications, details, techniques, and numerous risk of the surgery with her. She is aware of the risk of bleeding, infection, recurrence, nerve damage and chronic pain, injury to adjacent organs with major reconstructive surgery, and other unforeseen problems. I reviewed all this with the patient information booklet. She understands all of these issues. All of her questions were answered. We will schedule electively later on this year when she is ready.   Allergies  No Known Drug  Allergies   Medication History  Metoprolol Succinate ER (25MG  Tablet ER 24HR, Oral) Active. Multi Vitamin Daily (Oral) Active.  Vitals  Weight: 129.25 lb Temp.: 97.35F  Pulse: 68 (Regular)  Resp.: 18 (Unlabored)  P.OX: 98% (Room air) BP: 128/84 (Sitting, Left Arm, Standard)       Physical Exam  General Mental Status-Alert. General Appearance-Consistent with stated age. Hydration-Well hydrated. Voice-Normal.  Head and Neck Head-normocephalic, atraumatic with no lesions or palpable masses. Trachea-midline. Thyroid Gland Characteristics - normal size and consistency.  Eye Eyeball - Bilateral-Extraocular movements intact. Sclera/Conjunctiva - Bilateral-No scleral icterus.  Chest and Lung Exam Chest and lung exam reveals -quiet, even and easy respiratory effort with no use of accessory muscles and on auscultation, normal breath sounds, no adventitious sounds and normal vocal resonance. Inspection Chest Wall - Normal. Back - normal.  Cardiovascular Cardiovascular examination reveals -normal heart sounds, regular rate and rhythm with no murmurs and normal pedal pulses bilaterally.  Abdomen Inspection Inspection of the abdomen reveals - No Hernias. Skin - Scar - no surgical scars. Palpation/Percussion Palpation and Percussion of the abdomen reveal - Soft, Non Tender, No Rebound tenderness, No Rigidity (guarding) and No hepatosplenomegaly. Auscultation Auscultation of the abdomen reveals - Bowel sounds normal.  Female Genitourinary Note: Examined supine and standing. She has a right inguinal hernia size of a golf ball. This is reducible. This seems to be above the inguinal ligament. She has a good pulse in the right groin. I  cannot detect a femoral hernia. Left groin exam is normal.   Neurologic Neurologic evaluation reveals -alert and oriented x 3 with no impairment of recent or remote memory. Mental  Status-Normal.  Musculoskeletal Normal Exam - Left-Upper Extremity Strength Normal and Lower Extremity Strength Normal. Normal Exam - Right-Upper Extremity Strength Normal and Lower Extremity Strength Normal. Note: Right hip scar well-healed   Lymphatic Head & Neck  General Head & Neck Lymphatics: Bilateral - Description - Normal. Axillary  General Axillary Region: Bilateral - Description - Normal. Tenderness - Non Tender. Femoral & Inguinal  Generalized Femoral & Inguinal Lymphatics: Bilateral - Description - Normal. Tenderness - Non Tender.    Assessment & Plan  RIGHT INGUINAL HERNIA (K40.90)   You have a reducible right inguinal hernia As we discussed, this will likely get bigger and become more painful as time goes on you had stated that she would like to have this repaired, perhaps in late November or December after you get back from a trip out of town That is reasonable, unless you develop severe pain  you will be scheduled for open repair of right inguinal hernia with mesh. Dr. Derrell Lolling has discussed the indications, techniques, and risk of the surgery in detail   HISTORY OF VAGINAL HYSTERECTOMY (Z90.710) HISTORY OF HIP REPLACEMENT, TOTAL, RIGHT (Z96.641) HYPERTENSION, ESSENTIAL (I10) HISTORY OF CATARACT SURGERY (Z98.49)

## 2017-09-04 ENCOUNTER — Encounter (HOSPITAL_BASED_OUTPATIENT_CLINIC_OR_DEPARTMENT_OTHER): Payer: Self-pay | Admitting: *Deleted

## 2017-09-07 ENCOUNTER — Ambulatory Visit (HOSPITAL_BASED_OUTPATIENT_CLINIC_OR_DEPARTMENT_OTHER): Payer: Medicare Other | Admitting: Certified Registered"

## 2017-09-07 ENCOUNTER — Encounter (HOSPITAL_BASED_OUTPATIENT_CLINIC_OR_DEPARTMENT_OTHER): Payer: Self-pay | Admitting: *Deleted

## 2017-09-07 ENCOUNTER — Encounter (HOSPITAL_BASED_OUTPATIENT_CLINIC_OR_DEPARTMENT_OTHER)
Admission: RE | Disposition: A | Discharge status: Home / Self Care | Payer: Self-pay | Source: Ambulatory Visit | Attending: General Surgery

## 2017-09-07 ENCOUNTER — Ambulatory Visit (HOSPITAL_BASED_OUTPATIENT_CLINIC_OR_DEPARTMENT_OTHER)
Admission: RE | Admit: 2017-09-07 | Discharge: 2017-09-07 | Disposition: A | Discharge status: Home / Self Care | Payer: Medicare Other | Source: Ambulatory Visit | Attending: General Surgery | Admitting: General Surgery

## 2017-09-07 DIAGNOSIS — K219 Gastro-esophageal reflux disease without esophagitis: Secondary | ICD-10-CM | POA: Diagnosis not present

## 2017-09-07 DIAGNOSIS — M1990 Unspecified osteoarthritis, unspecified site: Secondary | ICD-10-CM | POA: Diagnosis not present

## 2017-09-07 DIAGNOSIS — K4090 Unilateral inguinal hernia, without obstruction or gangrene, not specified as recurrent: Secondary | ICD-10-CM | POA: Diagnosis present

## 2017-09-07 DIAGNOSIS — Z79899 Other long term (current) drug therapy: Secondary | ICD-10-CM | POA: Insufficient documentation

## 2017-09-07 HISTORY — DX: Unilateral inguinal hernia, without obstruction or gangrene, not specified as recurrent: K40.90

## 2017-09-07 HISTORY — DX: Unspecified osteoarthritis, unspecified site: M19.90

## 2017-09-07 HISTORY — PX: INSERTION OF MESH: SHX5868

## 2017-09-07 HISTORY — DX: Other complications of anesthesia, initial encounter: T88.59XA

## 2017-09-07 HISTORY — PX: INGUINAL HERNIA REPAIR: SHX194

## 2017-09-07 HISTORY — DX: Adverse effect of unspecified anesthetic, initial encounter: T41.45XA

## 2017-09-07 HISTORY — DX: Gastro-esophageal reflux disease without esophagitis: K21.9

## 2017-09-07 HISTORY — DX: Nausea with vomiting, unspecified: R11.2

## 2017-09-07 HISTORY — DX: Other specified postprocedural states: Z98.890

## 2017-09-07 SURGERY — REPAIR, HERNIA, INGUINAL, ADULT
Anesthesia: General | Site: Groin | Laterality: Right

## 2017-09-07 MED ORDER — SODIUM CHLORIDE 0.9% FLUSH: 3.0000 mL | INTRAVENOUS | Status: DC | PRN

## 2017-09-07 MED ORDER — PROPOFOL 10 MG/ML IV BOLUS
INTRAVENOUS | Status: DC | PRN
  Administered 2017-09-07: 120 mg via INTRAVENOUS

## 2017-09-07 MED ORDER — LACTATED RINGERS IV SOLN: INTRAVENOUS | Status: DC

## 2017-09-07 MED ORDER — ONDANSETRON HCL 4 MG PO TABS: 4.0000 mg | ORAL_TABLET | Freq: Once | ORAL | Status: DC

## 2017-09-07 MED ORDER — ROCURONIUM BROMIDE 100 MG/10ML IV SOLN
INTRAVENOUS | Status: DC | PRN
  Administered 2017-09-07: 40 mg via INTRAVENOUS

## 2017-09-07 MED ORDER — OXYCODONE HCL 5 MG PO TABS: 5.0000 mg | ORAL_TABLET | Freq: Once | ORAL | Status: DC | PRN

## 2017-09-07 MED ORDER — SUGAMMADEX SODIUM 500 MG/5ML IV SOLN
INTRAVENOUS | Status: DC | PRN
  Administered 2017-09-07: 240 mg via INTRAVENOUS

## 2017-09-07 MED ORDER — PROMETHAZINE HCL 25 MG/ML IJ SOLN: 6.2500 mg | INTRAMUSCULAR | Status: DC | PRN

## 2017-09-07 MED ORDER — 0.9 % SODIUM CHLORIDE (POUR BTL) OPTIME
TOPICAL | Status: DC | PRN
  Administered 2017-09-07: 200 mL

## 2017-09-07 MED ORDER — LACTATED RINGERS IV SOLN
INTRAVENOUS | Status: DC
  Administered 2017-09-07 (×2): via INTRAVENOUS

## 2017-09-07 MED ORDER — HYDROMORPHONE HCL 1 MG/ML IJ SOLN: 0.2500 mg | INTRAMUSCULAR | Status: DC | PRN

## 2017-09-07 MED ORDER — SCOPOLAMINE 1 MG/3DAYS TD PT72: 1.0000 | MEDICATED_PATCH | Freq: Once | TRANSDERMAL | Status: DC | PRN

## 2017-09-07 MED ORDER — CELECOXIB 100 MG PO CAPS: 100.0000 mg | ORAL_CAPSULE | ORAL | Status: DC

## 2017-09-07 MED ORDER — BUPIVACAINE-EPINEPHRINE 0.5% -1:200000 IJ SOLN
INTRAMUSCULAR | Status: DC | PRN
  Administered 2017-09-07: 10 mL

## 2017-09-07 MED ORDER — LIDOCAINE 2% (20 MG/ML) 5 ML SYRINGE
INTRAMUSCULAR | Status: AC
  Filled 2017-09-07: qty 20

## 2017-09-07 MED ORDER — CEFAZOLIN SODIUM-DEXTROSE 2-4 GM/100ML-% IV SOLN
INTRAVENOUS | Status: AC
Start: 2017-09-07 — End: ?
  Filled 2017-09-07: qty 100

## 2017-09-07 MED ORDER — HYDROCODONE-ACETAMINOPHEN 5-325 MG PO TABS: 1.0000 | ORAL_TABLET | Freq: Four times a day (QID) | ORAL | 0 refills | Status: DC | PRN

## 2017-09-07 MED ORDER — FENTANYL CITRATE (PF) 100 MCG/2ML IJ SOLN
INTRAMUSCULAR | Status: AC
  Filled 2017-09-07: qty 2

## 2017-09-07 MED ORDER — LIDOCAINE HCL (CARDIAC) 20 MG/ML IV SOLN
INTRAVENOUS | Status: DC | PRN
  Administered 2017-09-07: 30 mg via INTRAVENOUS

## 2017-09-07 MED ORDER — CHLORHEXIDINE GLUCONATE CLOTH 2 % EX PADS: 6.0000 | MEDICATED_PAD | Freq: Once | CUTANEOUS | Status: DC

## 2017-09-07 MED ORDER — ACETAMINOPHEN 650 MG RE SUPP: 650.0000 mg | RECTAL | Status: DC | PRN

## 2017-09-07 MED ORDER — FENTANYL CITRATE (PF) 100 MCG/2ML IJ SOLN
50.0000 ug | INTRAMUSCULAR | Status: DC | PRN
  Administered 2017-09-07: 25 ug via INTRAVENOUS
  Administered 2017-09-07: 50 ug via INTRAVENOUS

## 2017-09-07 MED ORDER — ONDANSETRON 4 MG PO TBDP
ORAL_TABLET | ORAL | Status: AC
  Filled 2017-09-07: qty 1

## 2017-09-07 MED ORDER — EPHEDRINE 5 MG/ML INJ
INTRAVENOUS | Status: AC
  Filled 2017-09-07: qty 10

## 2017-09-07 MED ORDER — ACETAMINOPHEN 325 MG PO TABS: 650.0000 mg | ORAL_TABLET | ORAL | Status: DC | PRN

## 2017-09-07 MED ORDER — GABAPENTIN 300 MG PO CAPS
300.0000 mg | ORAL_CAPSULE | ORAL | Status: AC
  Administered 2017-09-07: 300 mg via ORAL

## 2017-09-07 MED ORDER — GABAPENTIN 300 MG PO CAPS
ORAL_CAPSULE | ORAL | Status: AC
  Filled 2017-09-07: qty 1

## 2017-09-07 MED ORDER — ACETAMINOPHEN 500 MG PO TABS
ORAL_TABLET | ORAL | Status: AC
  Filled 2017-09-07: qty 2

## 2017-09-07 MED ORDER — ACETAMINOPHEN 500 MG PO TABS
1000.0000 mg | ORAL_TABLET | ORAL | Status: AC
  Administered 2017-09-07: 1000 mg via ORAL

## 2017-09-07 MED ORDER — DEXAMETHASONE SODIUM PHOSPHATE 10 MG/ML IJ SOLN
INTRAMUSCULAR | Status: AC
  Filled 2017-09-07: qty 3

## 2017-09-07 MED ORDER — CEFAZOLIN SODIUM-DEXTROSE 2-4 GM/100ML-% IV SOLN
2.0000 g | INTRAVENOUS | Status: AC
  Administered 2017-09-07: 2 g via INTRAVENOUS

## 2017-09-07 MED ORDER — SODIUM CHLORIDE 0.9 % IV SOLN: 250.0000 mL | INTRAVENOUS | Status: DC | PRN

## 2017-09-07 MED ORDER — MEPERIDINE HCL 25 MG/ML IJ SOLN: 6.2500 mg | INTRAMUSCULAR | Status: DC | PRN

## 2017-09-07 MED ORDER — OXYCODONE HCL 5 MG PO TABS: 5.0000 mg | ORAL_TABLET | ORAL | Status: DC | PRN

## 2017-09-07 MED ORDER — DEXAMETHASONE SODIUM PHOSPHATE 4 MG/ML IJ SOLN
INTRAMUSCULAR | Status: DC | PRN
  Administered 2017-09-07: 4 mg via INTRAVENOUS

## 2017-09-07 MED ORDER — SODIUM CHLORIDE 0.9% FLUSH: 3.0000 mL | Freq: Two times a day (BID) | INTRAVENOUS | Status: DC

## 2017-09-07 MED ORDER — ONDANSETRON 4 MG PO TBDP
4.0000 mg | ORAL_TABLET | Freq: Once | ORAL | Status: AC
  Administered 2017-09-07: 4 mg via ORAL

## 2017-09-07 MED ORDER — OXYCODONE HCL 5 MG/5ML PO SOLN: 5.0000 mg | Freq: Once | ORAL | Status: DC | PRN

## 2017-09-07 MED ORDER — FENTANYL CITRATE (PF) 100 MCG/2ML IJ SOLN: 25.0000 ug | INTRAMUSCULAR | Status: DC | PRN

## 2017-09-07 MED ORDER — MIDAZOLAM HCL 2 MG/2ML IJ SOLN: 1.0000 mg | INTRAMUSCULAR | Status: DC | PRN

## 2017-09-07 MED ORDER — BUPIVACAINE-EPINEPHRINE (PF) 0.5% -1:200000 IJ SOLN
INTRAMUSCULAR | Status: DC | PRN
  Administered 2017-09-07: 30 mL

## 2017-09-07 MED ORDER — ONDANSETRON HCL 4 MG/2ML IJ SOLN
INTRAMUSCULAR | Status: AC
  Filled 2017-09-07: qty 12

## 2017-09-07 SURGICAL SUPPLY — 60 items
BENZOIN TINCTURE PRP APPL 2/3 (GAUZE/BANDAGES/DRESSINGS) IMPLANT
BLADE CLIPPER SURG (BLADE) ×2 IMPLANT
BLADE HEX COATED 2.75 (ELECTRODE) ×2 IMPLANT
BLADE SURG 10 STRL SS (BLADE) ×2 IMPLANT
CANISTER SUCT 1200ML W/VALVE (MISCELLANEOUS) ×2 IMPLANT
CHLORAPREP W/TINT 26ML (MISCELLANEOUS) ×2 IMPLANT
COVER BACK TABLE 60X90IN (DRAPES) ×2 IMPLANT
COVER MAYO STAND STRL (DRAPES) ×2 IMPLANT
DECANTER SPIKE VIAL GLASS SM (MISCELLANEOUS) IMPLANT
DERMABOND ADVANCED (GAUZE/BANDAGES/DRESSINGS) ×1
DERMABOND ADVANCED .7 DNX12 (GAUZE/BANDAGES/DRESSINGS) ×1 IMPLANT
DRAIN PENROSE 1/2X12 LTX STRL (WOUND CARE) ×2 IMPLANT
DRAPE LAPAROTOMY 100X72 PEDS (DRAPES) ×2 IMPLANT
DRAPE LAPAROTOMY TRNSV 102X78 (DRAPE) IMPLANT
DRAPE UTILITY XL STRL (DRAPES) ×2 IMPLANT
ELECT REM PT RETURN 9FT ADLT (ELECTROSURGICAL) ×2
ELECTRODE REM PT RTRN 9FT ADLT (ELECTROSURGICAL) ×1 IMPLANT
GAUZE SPONGE 4X4 12PLY STRL LF (GAUZE/BANDAGES/DRESSINGS) IMPLANT
GLOVE BIO SURGEON STRL SZ7 (GLOVE) ×2 IMPLANT
GLOVE BIOGEL PI IND STRL 7.0 (GLOVE) ×1 IMPLANT
GLOVE BIOGEL PI INDICATOR 7.0 (GLOVE) ×1
GLOVE EUDERMIC 7 POWDERFREE (GLOVE) ×2 IMPLANT
GOWN STRL REUS W/ TWL LRG LVL3 (GOWN DISPOSABLE) IMPLANT
GOWN STRL REUS W/ TWL XL LVL3 (GOWN DISPOSABLE) ×2 IMPLANT
GOWN STRL REUS W/TWL LRG LVL3 (GOWN DISPOSABLE)
GOWN STRL REUS W/TWL XL LVL3 (GOWN DISPOSABLE) ×2
MESH ULTRAPRO 3X6 7.6X15CM (Mesh General) ×2 IMPLANT
NEEDLE HYPO 22GX1.5 SAFETY (NEEDLE) ×2 IMPLANT
NEEDLE HYPO 25X1 1.5 SAFETY (NEEDLE) ×2 IMPLANT
NS IRRIG 1000ML POUR BTL (IV SOLUTION) ×2 IMPLANT
PACK BASIN DAY SURGERY FS (CUSTOM PROCEDURE TRAY) ×2 IMPLANT
PENCIL BUTTON HOLSTER BLD 10FT (ELECTRODE) ×2 IMPLANT
SLEEVE SCD COMPRESS KNEE MED (MISCELLANEOUS) ×2 IMPLANT
SPONGE LAP 4X18 X RAY DECT (DISPOSABLE) ×2 IMPLANT
STAPLER VISISTAT 35W (STAPLE) IMPLANT
STRIP CLOSURE SKIN 1/2X4 (GAUZE/BANDAGES/DRESSINGS) IMPLANT
SUT MNCRL AB 4-0 PS2 18 (SUTURE) ×2 IMPLANT
SUT PROLENE 1 CT (SUTURE) IMPLANT
SUT PROLENE 2 0 CT2 30 (SUTURE) ×4 IMPLANT
SUT SILK 2 0 SH (SUTURE) ×2 IMPLANT
SUT SILK 2 0 TIES 17X18 (SUTURE) ×1
SUT SILK 2-0 18XBRD TIE BLK (SUTURE) ×1 IMPLANT
SUT SILK 3 0 SH 30 (SUTURE) IMPLANT
SUT VIC AB 2-0 CT1 27 (SUTURE)
SUT VIC AB 2-0 CT1 TAPERPNT 27 (SUTURE) IMPLANT
SUT VIC AB 2-0 SH 27 (SUTURE) ×1
SUT VIC AB 2-0 SH 27XBRD (SUTURE) ×1 IMPLANT
SUT VIC AB 3-0 54X BRD REEL (SUTURE) IMPLANT
SUT VIC AB 3-0 BRD 54 (SUTURE)
SUT VIC AB 3-0 FS2 27 (SUTURE) IMPLANT
SUT VIC AB 3-0 SH 27 (SUTURE) ×2
SUT VIC AB 3-0 SH 27X BRD (SUTURE) ×2 IMPLANT
SUT VICRYL 3-0 CR8 SH (SUTURE) IMPLANT
SYR 10ML LL (SYRINGE) ×2 IMPLANT
SYR 20CC LL (SYRINGE) ×2 IMPLANT
SYR BULB 3OZ (MISCELLANEOUS) ×2 IMPLANT
TOWEL OR NON WOVEN STRL DISP B (DISPOSABLE) ×2 IMPLANT
TRAY DSU PREP LF (CUSTOM PROCEDURE TRAY) ×2 IMPLANT
TUBE CONNECTING 20X1/4 (TUBING) ×2 IMPLANT
YANKAUER SUCT BULB TIP NO VENT (SUCTIONS) ×2 IMPLANT

## 2017-09-07 NOTE — Op Note (Signed)
Patient Name:           Sheri Brown   Date of Surgery:        09/07/2017  Pre op Diagnosis:      Right inguinal hernia  Post op Diagnosis:    Indirect right inguinal hernia  Procedure:                 Open repair right inguinal hernia with mesh Armanda Heritage(Lichtenstein repair)  Surgeon:                     Angelia MouldHaywood M. Derrell LollingIngram, M.D., FACS  Assistant:                      OR staff   Indication for Assistant: N/A  Operative Indications:   This is a pleasant 81 year old woman, referred by Sheri SilversJennifer Couillard, PA at [facility] for evaluation of a symptomatic right inguinal hernia       The patient has no prior history of hernia or abdominal surgery. She says she had a cardiac catheterization through the right groin with negative findings a year ago. She says she's noticed a bulge in her right groin for about a year. This is getting larger and causing some pain but it has never been incarcerated. No gastrointestinal symptoms.      She would like to have her hernia repaired.      She'll be scheduled for open repair of right inguinal hernia with mesh.she is brought to the operating room electively   Operative Findings:       She had an indirect right inguinal hernia.  Reducible bulge the size of a golf ball.  When I inverted the hernia sac I palpated medial to the femoral vessels and felt no evidence of a femoral hernia.  Repaired with ultra Pro mesh  Procedure in Detail:          Following the induction of general endotracheal anesthesia the patient's lower abdomen right groin and genitalia were prepped and draped in a sterile fashion.  Surgical timeout was performed.  Intravenous antibiotics were given.  She had been given a TAPP block by the anesthesiologist.  I supplemented this with 0.5% Marcaine with epinephrine into the subcutaneous tissues.     A transverse incision was made in the right groin overlying the inguinal canal.  Dissection was carried down through Scarpa's fascia exposing the aponeurosis  of the external oblique.  The external oblique was incised in the direction of its fibers, opening up the external inguinal ring.  The external oblique was dissected away from the underlying tissues and self-retaining retractors were placed.  Ilioinguinal nerve was isolated, clamped at the level of its emergence from the internal oblique laterally divided and ligated with 2-0 silk tie.  Redundant nerve was excised.  I dissected the indirect hernia sac off of the pubic tubercle, dividing the very small round ligament as part of this dissection.  When I dissected everything back to the level of the internal ring I simply inverted the sac and oversewed it with running suture of 2-0 Vicryl.  The floor of the inguinal canal was then repaired and reinforced with a onlay mesh graft of ultra Pro mesh.  A 3" x 6" piece of mesh was brought to the operative field and trimmed the corners to accommodate the elliptical anatomy of the wound.  The mesh was sutured so as to overlap the pubic tubercle generously, then along the inguinal ligament inferiorly.  Medially, superiorly, and laterally several mattress sutures of 2-0 Prolene were placed.  This provided excellent coverage and repair.    Hemostasis was excellent.  The wound was irrigated.  The external oblique was closed with a running suture of 2-0 Vicryl.  Scarpa's fascia was closed with 3-0 Vicryl sutures and the skin closed with a running subcuticular 4-0 Monocryl and Dermabond.  The patient tolerated the procedure well was taken to PACU in stable condition.  EBL 10 mL.  Counts correct.  Complications none.     Angelia Mould. Derrell Lolling, M.D., FACS General and Minimally Invasive Surgery Breast and Colorectal Surgery    Addendum: I logged onto the [facility] and reviewed her prescription medication history.  09/07/2017 9:50 AM

## 2017-09-07 NOTE — Progress Notes (Signed)
Assisted Dr. Germeroth with right, ultrasound guided, transabdominal plane block. Side rails up, monitors on throughout procedure. See vital signs in flow sheet. Tolerated Procedure well. 

## 2017-09-07 NOTE — Transfer of Care (Signed)
Immediate Anesthesia Transfer of Care Note  Patient: Nataliyah Hudman  Procedure(s) Performed: OPEN REPAIR RIGHT INGUINAL HERNIA WITH MESH (Right Groin) INSERTION OF MESH (Right Groin)  Patient Location: PACU  Anesthesia Type:GA combined with regional for post-op pain  Level of Consciousness: awake and patient cooperative  Airway & Oxygen Therapy: Patient Spontanous Breathing and Patient connected to face mask oxygen  Post-op Assessment: Report given to RN and Post -op Vital signs reviewed and stable  Post vital signs: Reviewed and stable  Last Vitals:  Vitals:   09/07/17 0835 09/07/17 0840  BP: 138/69 137/69  Pulse: 85 81  Resp: 17 16  Temp:    SpO2: 100% 100%    Last Pain:  Vitals:   09/07/17 0733  TempSrc: Oral         Complications: No apparent anesthesia complications

## 2017-09-07 NOTE — Interval H&P Note (Signed)
History and Physical Interval Note:  09/07/2017 7:14 AM  Sheri Brown  has presented today for surgery, with the diagnosis of RIGHT INGUINAL HERNIA  The various methods of treatment have been discussed with the patient and family. After consideration of risks, benefits and other options for treatment, the patient has consented to  Procedure(s): OPEN REPAIR RIGHT INGUINAL HERNIA WITH MESH (Right) INSERTION OF MESH (Right) as a surgical intervention .  The patient's history has been reviewed, patient examined, no change in status, stable for surgery.  I have reviewed the patient's chart and labs.  Questions were answered to the patient's satisfaction.     Ernestene MentionINGRAM,Manuel Dall M

## 2017-09-07 NOTE — Anesthesia Procedure Notes (Signed)
Anesthesia Regional Block: TAP block   Pre-Anesthetic Checklist: ,, timeout performed, Correct Patient, Correct Site, Correct Laterality, Correct Procedure, Correct Position, site marked, Risks and benefits discussed,  Surgical consent,  Pre-op evaluation,  At surgeon's request and post-op pain management  Laterality: Right  Prep: chloraprep       Needles:   Needle Type: Stimiplex     Needle Length: 9cm      Additional Needles:   Procedures:,,,, ultrasound used (permanent image in chart),,,,  Narrative:  Start time: 09/07/2017 8:15 AM End time: 09/07/2017 8:20 AM Injection made incrementally with aspirations every 5 mL.  Performed by: Personally  Anesthesiologist: Lewie LoronGERMEROTH, Victorina Kable  Additional Notes: Patient tolerated well. Good fascial spread noted.

## 2017-09-07 NOTE — Anesthesia Postprocedure Evaluation (Signed)
Anesthesia Post Note  Patient: Careers information officerLottie Schar  Procedure(s) Performed: OPEN REPAIR RIGHT INGUINAL HERNIA WITH MESH (Right Groin) INSERTION OF MESH (Right Groin)     Patient location during evaluation: PACU Anesthesia Type: General Level of consciousness: sedated Pain management: pain level controlled Vital Signs Assessment: post-procedure vital signs reviewed and stable Respiratory status: spontaneous breathing and respiratory function stable Cardiovascular status: stable Postop Assessment: no apparent nausea or vomiting Anesthetic complications: no    Last Vitals:  Vitals:   09/07/17 1015 09/07/17 1030  BP: (!) 177/94 (!) 179/94  Pulse: 92 85  Resp: 15 15  Temp:    SpO2: 100% 97%    Last Pain:  Vitals:   09/07/17 1030  TempSrc:   PainSc: 4                  Vidalia Serpas DANIEL

## 2017-09-07 NOTE — Anesthesia Preprocedure Evaluation (Addendum)
Anesthesia Evaluation  Patient identified by MRN, date of birth, ID band Patient awake    Reviewed: Allergy & Precautions, NPO status , Patient's Chart, lab work & pertinent test results  History of Anesthesia Complications (+) PONV and history of anesthetic complications  Airway Mallampati: II  TM Distance: >3 FB Neck ROM: Full    Dental no notable dental hx.    Pulmonary shortness of breath,    Pulmonary exam normal breath sounds clear to auscultation       Cardiovascular negative cardio ROS Normal cardiovascular exam Rhythm:Regular Rate:Normal     Neuro/Psych negative neurological ROS  negative psych ROS   GI/Hepatic Neg liver ROS, GERD  ,  Endo/Other  negative endocrine ROS  Renal/GU negative Renal ROS     Musculoskeletal  (+) Arthritis ,   Abdominal   Peds  Hematology negative hematology ROS (+)   Anesthesia Other Findings   Reproductive/Obstetrics negative OB ROS                            Anesthesia Physical Anesthesia Plan  ASA: II  Anesthesia Plan: General   Post-op Pain Management: GA combined w/ Regional for post-op pain   Induction: Intravenous  PONV Risk Score and Plan: 4 or greater and Ondansetron, Dexamethasone, Midazolam and Scopolamine patch - Pre-op  Airway Management Planned: LMA  Additional Equipment:   Intra-op Plan:   Post-operative Plan: Extubation in OR  Informed Consent: I have reviewed the patients History and Physical, chart, labs and discussed the procedure including the risks, benefits and alternatives for the proposed anesthesia with the patient or authorized representative who has indicated his/her understanding and acceptance.   Dental advisory given  Plan Discussed with: CRNA  Anesthesia Plan Comments:         Anesthesia Quick Evaluation

## 2017-09-07 NOTE — Discharge Instructions (Signed)
[facility]  UMBILICAL OR INGUINAL HERNIA REPAIR: POST OP INSTRUCTIONS  Always review your discharge instruction sheet given to you by the facility where your surgery was performed. IF YOU HAVE DISABILITY OR FAMILY LEAVE FORMS, YOU MUST BRING THEM TO THE OFFICE FOR PROCESSING.   DO NOT GIVE THEM TO YOUR DOCTOR.  1. A  prescription for pain medication may be given to you upon discharge.  Take your pain medication as prescribed, if needed.  If narcotic pain medicine is not needed, then you may take acetaminophen (Tylenol) or ibuprofen (Advil) as needed. 2. Take your usually prescribed medications unless otherwise directed. If you need a refill on your pain medication, please contact your pharmacy.  They will contact our office to request authorization. Prescriptions will not be filled after 5 pm or on week-ends. 3. You should follow a light diet the first 24 hours after arrival home, such as soup and crackers, etc.  Be sure to include lots of fluids daily.  Resume your normal diet the day after surgery. 4.Most patients will experience some swelling and bruising in the groin .  Ice packs and reclining will help.  Swelling and bruising can take several days to resolve.  6. It is common to experience some constipation if taking pain medication after surgery.  Increasing fluid intake and taking a stool softener (such as Colace) will usually help or prevent this problem from occurring.  A mild laxative (Milk of Magnesia or Miralax) should be taken according to package directions if there are no bowel movements after 48 hours. 7. Unless discharge instructions indicate otherwise, you may remove your bandages 24-48 hours after surgery, and you may shower at that time.  You may have steri-strips (small skin tapes) in place directly over the incision.  These strips should be left on the skin for 7-10 days.  If your surgeon used skin glue on the incision, you may shower in 24 hours.  The  glue will flake off over the next 2-3 weeks.  Any sutures or staples will be removed at the office during your follow-up visit. 8. ACTIVITIES:  You may resume regular (light) daily activities beginning the next day--such as daily self-care, walking, climbing stairs--gradually increasing activities as tolerated.    Refrain from any heavy lifting or straining until approved by your doctor.  a.You may drive when you are no longer taking prescription pain medication, you can comfortably wear a seatbelt, and you can safely maneuver your car and apply brakes. b.RETURN TO WORK:   _____________________________________________  9.You should see your doctor in the office for a follow-up appointment approximately 2-3 weeks after your surgery.  Make sure that you call for this appointment within a day or two after you arrive home to insure a convenient appointment time. 10.OTHER INSTRUCTIONS: ____No driving for 7 days_____________________    _____________________________________  WHEN TO CALL YOUR DOCTOR: 1. Fever over 101.0 2. Inability to urinate 3. Nausea and/or vomiting 4. Extreme swelling or bruising 5. Continued bleeding from incision. 6. Increased pain, redness, or drainage from the incision  The clinic staff is available to answer your questions during regular business hours.  Please dont hesitate to call and ask to speak to one of the nurses for clinical concerns.  If you have a medical emergency, go to the nearest emergency room or call 911.  A surgeon from [facility] is always on call at the hospital   [address], [place], [place]  [id number] ?  [address] [address], [place], [place]   [id number] [phone] ? [phone] ? FAX [phone] Web site: [email or web address]      Post Anesthesia Home Care Instructions  Activity: Get plenty of rest for the remainder of the day. A responsible individual must stay with you for 24 hours following the  procedure.  For the next 24 hours, DO NOT: -Drive a car -Advertising copywriterperate machinery -Drink alcoholic beverages -Take any medication unless instructed by your physician -Make any legal decisions or sign important papers.  Meals: Start with liquid foods such as gelatin or soup. Progress to regular foods as tolerated. Avoid greasy, spicy, heavy foods. If nausea and/or vomiting occur, drink only clear liquids until the nausea and/or vomiting subsides. Call your physician if vomiting continues.  Special Instructions/Symptoms: Your throat may feel dry or sore from the anesthesia or the breathing tube placed in your throat during surgery. If this causes discomfort, gargle with warm salt water. The discomfort should disappear within 24 hours.  If you had a scopolamine patch placed behind your ear for the management of post- operative nausea and/or vomiting:  1. The medication in the patch is effective for 72 hours, after which it should be removed.  Wrap patch in a tissue and discard in the trash. Wash hands thoroughly with soap and water. 2. You may remove the patch earlier than 72 hours if you experience unpleasant side effects which may include dry mouth, dizziness or visual disturbances. 3. Avoid touching the patch. Wash your hands with soap and water after contact with the patch.

## 2017-09-07 NOTE — Anesthesia Procedure Notes (Signed)
Procedure Name: Intubation Date/Time: 09/07/2017 8:50 AM Performed by: Rasool Rommel D Pre-anesthesia Checklist: Patient identified, Emergency Drugs available, Suction available and Patient being monitored Patient Re-evaluated:Patient Re-evaluated prior to induction Oxygen Delivery Method: Circle system utilized Preoxygenation: Pre-oxygenation with 100% oxygen Induction Type: IV induction Ventilation: Mask ventilation without difficulty Laryngoscope Size: Glidescope Grade View: Grade I Tube type: Oral Tube size: 7.0 mm Number of attempts: 1 Airway Equipment and Method: Stylet and Oral airway Placement Confirmation: ETT inserted through vocal cords under direct vision,  positive ETCO2 and breath sounds checked- equal and bilateral Secured at: 21 cm Tube secured with: Tape Dental Injury: Teeth and Oropharynx as per pre-operative assessment

## 2017-09-10 ENCOUNTER — Encounter (HOSPITAL_BASED_OUTPATIENT_CLINIC_OR_DEPARTMENT_OTHER): Payer: Self-pay | Admitting: General Surgery

## 2017-09-14 ENCOUNTER — Other Ambulatory Visit: Payer: Medicare Other

## 2017-09-14 ENCOUNTER — Ambulatory Visit
Admission: RE | Admit: 2017-09-14 | Discharge: 2017-09-14 | Disposition: A | Discharge status: Home / Self Care | Payer: Medicare Other | Source: Ambulatory Visit | Attending: Physician Assistant | Admitting: Physician Assistant

## 2017-09-14 ENCOUNTER — Ambulatory Visit
Admission: RE | Admit: 2017-09-14 | Discharge: 2017-09-14 | Disposition: A | Discharge status: Home / Self Care | Payer: Medicare Other | Source: Ambulatory Visit | Attending: Neurology | Admitting: Neurology

## 2017-09-14 DIAGNOSIS — M25561 Pain in right knee: Secondary | ICD-10-CM

## 2017-09-14 DIAGNOSIS — R413 Other amnesia: Secondary | ICD-10-CM

## 2017-09-16 ENCOUNTER — Telehealth: Payer: Self-pay | Admitting: Neurology

## 2017-09-16 NOTE — Telephone Encounter (Signed)
I called the patient.  MRI of the brain shows 1 old infarct of the right middle cerebellar peduncle, no other significant small vessel disease was noted.  I discussed this with the patient, left a message for the daughter.   MRI brain 09/14/17:  IMPRESSION:  This MRI of the brain without contrast shows the following: 1.   Brain volume is normal for age. 2.   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 3.   There are no acute findings.

## 2017-09-18 ENCOUNTER — Ambulatory Visit (INDEPENDENT_AMBULATORY_CARE_PROVIDER_SITE_OTHER): Payer: Medicare Other | Admitting: Sports Medicine

## 2017-09-18 DIAGNOSIS — M79675 Pain in left toe(s): Secondary | ICD-10-CM | POA: Diagnosis not present

## 2017-09-18 DIAGNOSIS — M79674 Pain in right toe(s): Secondary | ICD-10-CM | POA: Diagnosis not present

## 2017-09-18 DIAGNOSIS — B351 Tinea unguium: Secondary | ICD-10-CM

## 2017-09-18 NOTE — Progress Notes (Signed)
Patient ID: Sheri Brown, female   DOB: August 26, 1936, 81 y.o.   MRN: [id number]  Subjective: Sheri Brown is a 81 y.o. female patient seen today in office with complaint of painful thickened and elongated toenails; unable to trim.  Patient states that she is recovering from hernia surgery and that today is the 1st time that she has driven. Patient has no other pedal complaints at this time.   Patient Active Problem List   Diagnosis Date Noted  . Right inguinal hernia 09/07/2017  . Benign essential tremor 08/24/2017  . Memory difficulties 08/24/2017  . Shortness of breath 08/07/2016    Current Outpatient Prescriptions on File Prior to Visit  Medication Sig Dispense Refill  . donepezil (ARICEPT) 5 MG tablet Take 1 tablet (5 mg total) by mouth at bedtime. 30 tablet 1  . HYDROcodone-acetaminophen (NORCO) 5-325 MG tablet Take 1 tablet by mouth every 6 (six) hours as needed for moderate pain or severe pain. 20 tablet 0  . metoprolol succinate (TOPROL-XL) 25 MG 24 hr tablet Take 12.5 mg by mouth every evening.      No current facility-administered medications on file prior to visit.     Allergies  Allergen Reactions  . Diazepam Other (See Comments)    Mental disturbance.  . Gluten Meal Nausea Only and Other (See Comments)    Severe gastric upset-flatulence.    Objective: Physical Exam  General: Well developed, nourished, no acute distress, awake, alert and oriented x 3  Vascular: Dorsalis pedis artery 2/4 bilateral, Posterior tibial artery 1/4 bilateral, skin temperature warm to warm proximal to distal bilateral lower extremities, no varicosities, pedal hair present bilateral.  Neurological: Gross sensation present via light touch bilateral.   Dermatological: Skin is warm, dry, and supple bilateral, Nails 1-10 are tender, extremely long, thick, and discolored with moderate to severe subungal debris and discoloration, no webspace macerations present bilateral, no open lesions present  bilateral, + Very minimal hyperkeratotic tissue present at right 2nd toe distal tuft. No signs of infection bilateral.  Musculoskeletal: Hammertoes and Residual bunion deformities noted bilateral; had bunionectomy years ago. Muscular strength within normal limits without pain on range of motion. No pain with calf compression bilateral.  Assessment and Plan:  Problem List Items Addressed This Visit    None    Visit Diagnoses    Onychomycosis    -  Primary   Toe pain, bilateral         -Examined patient.  -Discussed treatment options for painful mycotic nails -Nails trimmed using sterile nail nipper without incident; patient does not like her nails to be smooth at all with the Dremel. -Recommend good supportive shoes for foot type -Patient to return in 3 months for nail trim or sooner if symptoms worsen.  Sheri Brown, DPM

## 2017-12-18 ENCOUNTER — Ambulatory Visit (INDEPENDENT_AMBULATORY_CARE_PROVIDER_SITE_OTHER): Payer: Medicare Other | Admitting: Sports Medicine

## 2017-12-18 ENCOUNTER — Encounter: Payer: Self-pay | Admitting: Sports Medicine

## 2017-12-18 DIAGNOSIS — M2040 Other hammer toe(s) (acquired), unspecified foot: Secondary | ICD-10-CM

## 2017-12-18 DIAGNOSIS — M21619 Bunion of unspecified foot: Secondary | ICD-10-CM

## 2017-12-18 DIAGNOSIS — M79674 Pain in right toe(s): Secondary | ICD-10-CM | POA: Diagnosis not present

## 2017-12-18 DIAGNOSIS — B351 Tinea unguium: Secondary | ICD-10-CM

## 2017-12-18 DIAGNOSIS — M79675 Pain in left toe(s): Secondary | ICD-10-CM

## 2017-12-18 NOTE — Progress Notes (Signed)
Patient ID: Sheri Brown, female   DOB: 05/12/1936, 82 y.o.   MRN: [id number]  Subjective: Sheri Brown is a 82 y.o. female patient seen today in office with complaint of painful thickened and elongated toenails; unable to trim.  Patient states that she is doing good. Patient has no other pedal complaints at this time.   Patient Active Problem List   Diagnosis Date Noted  . Right inguinal hernia 09/07/2017  . Benign essential tremor 08/24/2017  . Memory difficulties 08/24/2017  . Shortness of breath 08/07/2016    Current Outpatient Medications on File Prior to Visit  Medication Sig Dispense Refill  . donepezil (ARICEPT) 5 MG tablet Take 1 tablet (5 mg total) by mouth at bedtime. 30 tablet 1  . HYDROcodone-acetaminophen (NORCO) 5-325 MG tablet Take 1 tablet by mouth every 6 (six) hours as needed for moderate pain or severe pain. 20 tablet 0  . metoprolol succinate (TOPROL-XL) 25 MG 24 hr tablet Take 12.5 mg by mouth every evening.      No current facility-administered medications on file prior to visit.     Allergies  Allergen Reactions  . Diazepam Other (See Comments)    Mental disturbance.  . Gluten Meal Nausea Only and Other (See Comments)    Severe gastric upset-flatulence. Severe gastric upset-flatulence.    Objective: Physical Exam  General: Well developed, nourished, no acute distress, awake, alert and oriented x 3  Vascular: Dorsalis pedis artery 2/4 bilateral, Posterior tibial artery 1/4 bilateral, skin temperature warm to warm proximal to distal bilateral lower extremities, no varicosities, pedal hair present bilateral.  Neurological: Gross sensation present via light touch bilateral.   Dermatological: Skin is warm, dry, and supple bilateral, Nails 1-10 are tender, extremely long, thick, and discolored with moderate to severe subungal debris and discoloration, no webspace macerations present bilateral, no open lesions present bilateral, + minimal hyperkeratotic tissue  present at right 2nd toe distal tuft. No signs of infection bilateral.  Musculoskeletal: Hammertoes and Residual bunion deformities noted bilateral; had bunionectomy years ago. Muscular strength within normal limits without pain on range of motion. No pain with calf compression bilateral.  Assessment and Plan:  Problem List Items Addressed This Visit    None    Visit Diagnoses    Onychomycosis    -  Primary   Toe pain, bilateral       Bunion       Hammer toe, unspecified laterality         -Examined patient.  -Re-Discussed treatment options for painful mycotic nails -Nails x10 were trimmed using sterile nail nipper without incident -Recommend good supportive shoes for foot type -Patient to return in 3 months for nail trim or sooner if symptoms worsen.  Asencion Islamitorya Annabelle Rexroad, DPM

## 2018-02-28 ENCOUNTER — Ambulatory Visit: Payer: Medicare Other | Admitting: Adult Health

## 2018-03-19 ENCOUNTER — Ambulatory Visit: Payer: Medicare Other | Admitting: Sports Medicine

## 2018-04-09 ENCOUNTER — Encounter: Payer: Self-pay | Admitting: Sports Medicine

## 2018-04-09 ENCOUNTER — Encounter

## 2018-04-09 ENCOUNTER — Ambulatory Visit (INDEPENDENT_AMBULATORY_CARE_PROVIDER_SITE_OTHER): Payer: Medicare Other | Admitting: Sports Medicine

## 2018-04-09 DIAGNOSIS — M79675 Pain in left toe(s): Secondary | ICD-10-CM | POA: Diagnosis not present

## 2018-04-09 DIAGNOSIS — M2040 Other hammer toe(s) (acquired), unspecified foot: Secondary | ICD-10-CM

## 2018-04-09 DIAGNOSIS — M21619 Bunion of unspecified foot: Secondary | ICD-10-CM

## 2018-04-09 DIAGNOSIS — B351 Tinea unguium: Secondary | ICD-10-CM | POA: Diagnosis not present

## 2018-04-09 DIAGNOSIS — M79674 Pain in right toe(s): Secondary | ICD-10-CM | POA: Diagnosis not present

## 2018-04-09 NOTE — Progress Notes (Signed)
Patient ID: Sheri Brown, female   DOB: 1936/03/18, 82 y.o.   MRN: [id number]  Subjective: Sheri Brown is a 82 y.o. female patient seen today in office with complaint of painful thickened and elongated toenails; unable to trim.  Patient states that she is doing good nothing new since last visit. Patient has no other pedal complaints at this time.   Patient Active Problem List   Diagnosis Date Noted  . Right inguinal hernia 09/07/2017  . Benign essential tremor 08/24/2017  . Memory difficulties 08/24/2017  . Shortness of breath 08/07/2016    Current Outpatient Medications on File Prior to Visit  Medication Sig Dispense Refill  . donepezil (ARICEPT) 5 MG tablet Take 1 tablet (5 mg total) by mouth at bedtime. 30 tablet 1  . HYDROcodone-acetaminophen (NORCO) 5-325 MG tablet Take 1 tablet by mouth every 6 (six) hours as needed for moderate pain or severe pain. 20 tablet 0  . metoprolol succinate (TOPROL-XL) 25 MG 24 hr tablet Take 12.5 mg by mouth every evening.      No current facility-administered medications on file prior to visit.     Allergies  Allergen Reactions  . Diazepam Other (See Comments)    Mental disturbance.  . Gluten Meal Nausea Only and Other (See Comments)    Severe gastric upset-flatulence. Severe gastric upset-flatulence.    Objective: Physical Exam  General: Well developed, nourished, no acute distress, awake, alert and oriented x 3  Vascular: Dorsalis pedis artery 2/4 bilateral, Posterior tibial artery 1/4 bilateral, skin temperature warm to warm proximal to distal bilateral lower extremities, no varicosities, pedal hair present bilateral.  Neurological: Gross sensation present via light touch bilateral.   Dermatological: Skin is warm, dry, and supple bilateral, Nails 1-10 are tender, extremely long, thick, and discolored with moderate to severe subungal debris and discoloration, no webspace macerations present bilateral, no open lesions present bilateral, +  minimal hyperkeratotic tissue present at right 2nd toe distal tuft. No signs of infection bilateral.  Musculoskeletal: Hammertoes and Residual bunion deformities noted bilateral; had bunionectomy years ago. Muscular strength within normal limits without pain on range of motion. No pain with calf compression bilateral.  Assessment and Plan:  Problem List Items Addressed This Visit    None    Visit Diagnoses    Onychomycosis    -  Primary   Toe pain, bilateral       Bunion       Hammer toe, unspecified laterality         -Examined patient.  -Re-Discussed treatment options for painful mycotic nails -Nails x10 were trimmed using sterile nail nipper without incident -Recommend good supportive shoes for foot type -Patient to return in 3 months for nail trim or sooner if symptoms worsen.  [name], DPM

## 2018-07-09 ENCOUNTER — Ambulatory Visit: Payer: Medicare Other | Admitting: Sports Medicine

## 2018-07-23 ENCOUNTER — Encounter: Payer: Self-pay | Admitting: Sports Medicine

## 2018-07-23 ENCOUNTER — Ambulatory Visit (INDEPENDENT_AMBULATORY_CARE_PROVIDER_SITE_OTHER): Payer: Medicare Other | Admitting: Sports Medicine

## 2018-07-23 DIAGNOSIS — B351 Tinea unguium: Secondary | ICD-10-CM | POA: Diagnosis not present

## 2018-07-23 DIAGNOSIS — M79674 Pain in right toe(s): Secondary | ICD-10-CM | POA: Diagnosis not present

## 2018-07-23 DIAGNOSIS — M79675 Pain in left toe(s): Secondary | ICD-10-CM | POA: Diagnosis not present

## 2018-07-23 NOTE — Progress Notes (Signed)
Patient ID: Sheri Brown, female   DOB: [birth year], 82 y.o.   MRN: [id number]  Subjective: Sheri Brown is a 82 y.o. female patient seen today in office with complaint of painful thickened and elongated toenails; unable to trim.  Patient denies any changes with medicines or health since last visit. Patient has no other pedal complaints at this time.   Patient Active Problem List   Diagnosis Date Noted  . Right inguinal hernia 09/07/2017  . Benign essential tremor 08/24/2017  . Memory difficulties 08/24/2017  . Shortness of breath 08/07/2016  . Anemia 03/06/2016  . Dizziness 03/06/2016  . History of hyperlipidemia 03/06/2016  . Hypertension 03/06/2016    Current Outpatient Medications on File Prior to Visit  Medication Sig Dispense Refill  . donepezil (ARICEPT) 5 MG tablet Take 1 tablet (5 mg total) by mouth at bedtime. 30 tablet 1  . HYDROcodone-acetaminophen (NORCO) 5-325 MG tablet Take 1 tablet by mouth every 6 (six) hours as needed for moderate pain or severe pain. 20 tablet 0  . metoprolol succinate (TOPROL-XL) 25 MG 24 hr tablet Take 12.5 mg by mouth every evening.      No current facility-administered medications on file prior to visit.     Allergies  Allergen Reactions  . Diazepam Other (See Comments)    Mental disturbance.  . Gluten Meal Nausea Only and Other (See Comments)    Severe gastric upset-flatulence. Severe gastric upset-flatulence.    Objective: Physical Exam  General: Well developed, nourished, no acute distress, awake, alert and oriented x 3  Vascular: Dorsalis pedis artery 2/4 bilateral, Posterior tibial artery 1/4 bilateral, skin temperature warm to warm proximal to distal bilateral lower extremities, no varicosities, pedal hair present bilateral.  Neurological: Gross sensation present via light touch bilateral.   Dermatological: Skin is warm, dry, and supple bilateral, Nails 1-10 are tender, extremely long, thick, and discolored with moderate to severe  subungal debris and discoloration, no webspace macerations present bilateral, no open lesions present bilateral, + minimal hyperkeratotic tissue present at right 2nd toe distal tuft. No signs of infection bilateral.  Musculoskeletal: Hammertoes and Residual bunion deformities noted bilateral; had bunionectomy years ago. Muscular strength within normal limits without pain on range of motion. No pain with calf compression bilateral.  Assessment and Plan:  Problem List Items Addressed This Visit    None    Visit Diagnoses    Onychomycosis    -  Primary   Toe pain, bilateral         -Examined patient.  -Re-Discussed treatment options for painful mycotic nails -Nails x10 were trimmed using sterile nail nipper without incident; patient does not like her nails to be smoothed in office and prefers to file her nails herself at home -Recommend good supportive shoes for foot type -Patient to return in 3 months for nail trim or sooner if symptoms worsen.  [name], DPM

## 2018-07-26 ENCOUNTER — Encounter: Payer: Self-pay | Admitting: Neurology

## 2018-07-26 ENCOUNTER — Telehealth: Payer: Self-pay | Admitting: *Deleted

## 2018-07-26 ENCOUNTER — Ambulatory Visit (INDEPENDENT_AMBULATORY_CARE_PROVIDER_SITE_OTHER): Payer: Medicare Other | Admitting: Neurology

## 2018-07-26 ENCOUNTER — Encounter

## 2018-07-26 VITALS — BP 161/82 | HR 64 | Ht 65.0 in | Wt 124.5 lb

## 2018-07-26 DIAGNOSIS — R413 Other amnesia: Secondary | ICD-10-CM | POA: Diagnosis not present

## 2018-07-26 DIAGNOSIS — G250 Essential tremor: Secondary | ICD-10-CM | POA: Diagnosis not present

## 2018-07-26 MED ORDER — MEMANTINE HCL 5 MG PO TABS: ORAL_TABLET | ORAL | 0 refills | Status: DC

## 2018-07-26 MED ORDER — METOPROLOL SUCCINATE ER 25 MG PO TB24: 25.0000 mg | ORAL_TABLET | Freq: Every evening | ORAL | 3 refills | Status: DC

## 2018-07-26 NOTE — Telephone Encounter (Signed)
Faxed printed/signed rx memantine 5mg  tab to [facility] [phone]. Received fax confirmation.

## 2018-07-26 NOTE — Patient Instructions (Signed)
We will go up on the Toprol to 25 mg daily.  We will start Namenda for the memory. If you tolerate the drug well, call me in 1 month and I will call in another prescription for you.

## 2018-07-26 NOTE — Progress Notes (Signed)
Reason for visit: Memory disorder, tremor  Sheri Brown is an 82 y.o. female  History of present illness:  Sheri Brown is a 82 year old right-handed black female with a history of a progressive memory disturbance.  The patient was placed on Aricept when she was seen here last, but she stopped the medication secondary to dizziness.  She did not call our office regarding this.  She is on metoprolol in part for blood pressure issues, but she also has an essential tremor.  The patient has had increasing problems with handwriting, at times she may have difficulty feeding herself.  The tremor affects both hands.  The patient believes that there has been some slight progression of her memory since last seen.  The patient is still living independently, she operates a motor vehicle without difficulty, she is able to manage her own medications and appointments and do her finances.  The patient returns to this office for an evaluation.  The patient does have some degenerative arthritis affecting the right knee, she uses a cane for ambulation.  Past Medical History:  Diagnosis Date  . Arthritis    OA hands  . Benign essential tremor 08/24/2017  . Complication of anesthesia    "doesnt need alot of anesthesia"  . GERD (gastroesophageal reflux disease)   . Hypertension 03/06/2016  . Memory difficulties 08/24/2017  . PONV (postoperative nausea and vomiting)   . Right inguinal hernia 09/07/2017    Past Surgical History:  Procedure Laterality Date  . ABDOMINAL HYSTERECTOMY    . BLADDER SUSPENSION    . BUNIONECTOMY    . CARDIAC CATHETERIZATION N/A 08/08/2016   Procedure: Right Heart Cath;  Surgeon: Yates Decamp, MD;  Location: [facility];  Service: Cardiovascular;  Laterality: N/A;  . EYE SURGERY     cataract  . INGUINAL HERNIA REPAIR Right 09/07/2017   Procedure: OPEN REPAIR RIGHT INGUINAL HERNIA WITH MESH;  Surgeon: Claud Kelp, MD;  Location: [facility];  Service: General;   Laterality: Right;  . INSERTION OF MESH Right 09/07/2017   Procedure: INSERTION OF MESH;  Surgeon: Claud Kelp, MD;  Location: [facility];  Service: General;  Laterality: Right;  . JOINT REPLACEMENT Right     Family History  Problem Relation Age of Onset  . Hypertension Mother   . Cancer Father        bladder    Social history:  reports that she has never smoked. She has never used smokeless tobacco. She reports that she does not drink alcohol or use drugs.    Allergies  Allergen Reactions  . Aricept [Donepezil Hcl]     Pt stated it caused dizziness, nausea  . Diazepam Other (See Comments)    Mental disturbance.  . Gluten Meal Nausea Only and Other (See Comments)    Severe gastric upset-flatulence. Severe gastric upset-flatulence.    Medications:  Prior to Admission medications   Medication Sig Start Date End Date Taking? Authorizing Provider  donepezil (ARICEPT) 5 MG tablet Take 1 tablet (5 mg total) by mouth at bedtime. 08/24/17   York Spaniel, MD  HYDROcodone-acetaminophen (NORCO) 5-325 MG tablet Take 1 tablet by mouth every 6 (six) hours as needed for moderate pain or severe pain. 09/07/17   Claud Kelp, MD  metoprolol succinate (TOPROL-XL) 25 MG 24 hr tablet Take 12.5 mg by mouth every evening.  12/01/15   [provider]    ROS:  Out of a complete 14 system review of symptoms,  the patient complains only of the following symptoms, and all other reviewed systems are negative.  Fatigue Hearing loss Cold intolerance Incontinence of the bladder Memory loss, tremors Anxiety Snoring, sleep talking  Blood pressure (!) 161/82, pulse 64, height 5\' 5"  (1.651 m), weight 124 lb 8 oz (56.5 kg).  Physical Exam  General: The patient is alert and cooperative at the time of the examination.  Skin: No significant peripheral edema is noted.   Neurologic Exam  Mental status: The patient is alert and oriented x 3 at the time of the  examination. The Mini-Mental status examination done today shows a total score 24/30.   Cranial nerves: Facial symmetry is present. Speech is normal, no aphasia or dysarthria is noted. Extraocular movements are full. Visual fields are full.  Motor: The patient has good strength in all 4 extremities.  Sensory examination: Soft touch sensation is symmetric on the face, arms, and legs.  Coordination: The patient has good finger-nose-finger and heel-to-shin bilaterally.  The patient has minimal tremor with finger-nose-finger, but when drawing a spiral, there is significant tremor translated into handwriting.  Gait and station: The patient has a slightly wide-based, unsteady gait.  Tandem gait was not attempted.  The patient usually uses a quad cane for ambulation.  Romberg is negative. No drift is seen.  Reflexes: Deep tendon reflexes are symmetric.   MRI brain 09/14/17:  IMPRESSION: This MRI of the brain without contrast shows the following: 1. Brain volume is normal for age. 2. 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 3. There are no acute findings.  * MRI scan images were reviewed online. I agree with the written report.    Assessment/Plan:  1.  Memory disturbance  2.  Essential tremor  The patient will be increased on her metoprolol taking 25 mg daily.  She could not tolerate the Aricept, we will switch her to [facility], she will call if she is unable to tolerate the drug.  If she does well after 1 month, she will be converted to the 10 mg twice daily maintenance dose.  She will follow-up in 6 months.  Marlan Palau MD 07/26/2018 8:07 AM  [facility] [address] [place], [place] [address]  Phone [phone] Fax [phone]

## 2018-10-22 ENCOUNTER — Encounter: Payer: Self-pay | Admitting: Sports Medicine

## 2018-10-22 ENCOUNTER — Ambulatory Visit (INDEPENDENT_AMBULATORY_CARE_PROVIDER_SITE_OTHER): Payer: Medicare Other | Admitting: Sports Medicine

## 2018-10-22 DIAGNOSIS — M79674 Pain in right toe(s): Secondary | ICD-10-CM | POA: Diagnosis not present

## 2018-10-22 DIAGNOSIS — M79675 Pain in left toe(s): Secondary | ICD-10-CM

## 2018-10-22 DIAGNOSIS — B351 Tinea unguium: Secondary | ICD-10-CM | POA: Diagnosis not present

## 2018-10-22 NOTE — Progress Notes (Signed)
Patient ID: [name], female   DOB: 15-Feb-1936, 82 y.o.   MRN: [id number]  Subjective: Sheri Brown is a 82 y.o. female patient seen today in office with complaint of painful thickened and elongated toenails; unable to trim.  Patient denies any changes with medicines or health since last visit. Patient has no other pedal complaints at this time.   Patient Active Problem List   Diagnosis Date Noted  . Right inguinal hernia 09/07/2017  . Benign essential tremor 08/24/2017  . Memory difficulties 08/24/2017  . Shortness of breath 08/07/2016  . Anemia 03/06/2016  . Dizziness 03/06/2016  . History of hyperlipidemia 03/06/2016  . Hypertension 03/06/2016    Current Outpatient Medications on File Prior to Visit  Medication Sig Dispense Refill  . HYDROcodone-acetaminophen (NORCO) 5-325 MG tablet Take 1 tablet by mouth every 6 (six) hours as needed for moderate pain or severe pain. 20 tablet 0  . memantine (NAMENDA) 5 MG tablet Take 1 tablet daily for one week, then take 1 tablet twice daily for one week, then take 1 tablet in the morning and 2 in the evening for one week, then take 2 tablets twice daily 70 tablet 0  . metoprolol succinate (TOPROL-XL) 25 MG 24 hr tablet Take 1 tablet (25 mg total) by mouth every evening. 90 tablet 3   No current facility-administered medications on file prior to visit.     Allergies  Allergen Reactions  . Aricept [Donepezil Hcl]     Pt stated it caused dizziness, nausea  . Diazepam Other (See Comments)    Mental disturbance.  . Gluten Meal Nausea Only and Other (See Comments)    Severe gastric upset-flatulence. Severe gastric upset-flatulence.    Objective: Physical Exam  General: Well developed, nourished, no acute distress, awake, alert and oriented x 3  Vascular: Dorsalis pedis artery 2/4 bilateral, Posterior tibial artery 1/4 bilateral, skin temperature warm to warm proximal to distal bilateral lower extremities, no varicosities, pedal hair  present bilateral.  Neurological: Gross sensation present via light touch bilateral.   Dermatological: Skin is warm, dry, and supple bilateral, Nails 1-10 are tender, extremely long, thick, and discolored with moderate to severe subungal debris and discoloration, no webspace macerations present bilateral, no open lesions present bilateral, + minimal hyperkeratotic tissue present at right 2nd toe distal tuft. No signs of infection bilateral.  Musculoskeletal: Hammertoes and Residual bunion deformities noted bilateral; had bunionectomy years ago. Muscular strength within normal limits without pain on range of motion. No pain with calf compression bilateral.  Assessment and Plan:  Problem List Items Addressed This Visit    None    Visit Diagnoses    Onychomycosis    -  Primary   Toe pain, bilateral         -Examined patient.  -Re-Discussed treatment options for painful mycotic nails -Nails x10 were trimmed using sterile nail nipper without incident; patient does not like her nails to be smoothed in office and prefers to file her nails herself at home -Patient to return in 3 months for nail trim or sooner if symptoms worsen.  [name], DPM

## 2019-01-21 ENCOUNTER — Ambulatory Visit: Payer: Medicare Other | Admitting: Sports Medicine

## 2019-02-04 ENCOUNTER — Ambulatory Visit: Payer: Medicare Other | Admitting: Sports Medicine

## 2019-02-10 ENCOUNTER — Telehealth: Payer: Self-pay

## 2019-02-10 NOTE — Telephone Encounter (Signed)
I contacted the pt in regards to her 10:30 am appt with Dr. Anne Hahn tomorrow. I advised due to the covid-19 concerns we are limiting the # of pt's coming into the office. I advised we did have the option of providing a telephone visit and pt was agreeable.   Pt provided verbal consent for insurance to be billed for visit and consent to speak with MD over the phone for hippa.  I reviewed meds, allergies and pharmacy. Pt advised to be available between 10:00 am and 10:30 pt verbalized understanding and states the the best # to call is [phone].

## 2019-02-11 ENCOUNTER — Telehealth (INDEPENDENT_AMBULATORY_CARE_PROVIDER_SITE_OTHER): Payer: Medicare Other | Admitting: Neurology

## 2019-02-11 ENCOUNTER — Telehealth: Payer: Self-pay | Admitting: Neurology

## 2019-02-11 ENCOUNTER — Other Ambulatory Visit: Payer: Self-pay

## 2019-02-11 ENCOUNTER — Encounter: Payer: Self-pay | Admitting: Neurology

## 2019-02-11 DIAGNOSIS — R413 Other amnesia: Secondary | ICD-10-CM

## 2019-02-11 DIAGNOSIS — G250 Essential tremor: Secondary | ICD-10-CM | POA: Diagnosis not present

## 2019-02-11 MED ORDER — GABAPENTIN 100 MG PO CAPS: 100.0000 mg | ORAL_CAPSULE | Freq: Three times a day (TID) | ORAL | 3 refills | Status: DC

## 2019-02-11 NOTE — Telephone Encounter (Signed)
I called patient per Dr. Anne Hahn to schedule her 6 month follow up with NP. Patient did not answer so I LVM asking for her to call us back.

## 2019-02-11 NOTE — Progress Notes (Signed)
Virtual Visit via Telephone Note  I connected with [name] on 02/11/19 at 10:30 AM EDT by telephone and verified that I am speaking with the correct person using two identifiers.   I discussed the limitations, risks, security and privacy concerns of performing an evaluation and management service by telephone and the availability of in person appointments. I also discussed with the patient that there may be a patient responsible charge related to this service. The patient expressed understanding and agreed to proceed.   History of Present Illness: Sheri Brown is an 83 year old right-handed black female with a history of an essential tremor that appears to be somewhat worse with the right hand than the left.  The patient is on Toprol 25 mg tablets for this, but she could not tolerate the full dose and had to start splitting the tablets in half as she had increased problems with fatigue on higher dose.  The patient is still having significant problems with tremors, she has to use both hands to feed herself and to perform cooking activities.  The patient has difficulty with handwriting as well.  She does have some mild gait instability, she reports no recent falls.  The patient has had some mild memory issues, she could not tolerate Aricept in the past because of gastroenterological upset, but she was given a prescription for Namenda and apparently did not take the drug for fear of side effects.  The patient lives with her grandson, occasionally her 2 granddaughters may come to live with her as well for short periods of time.  She is able to keep up with medications, appointments, she operates a motor vehicle without difficulty and she cooks for herself.  She has not given up any activities of daily living because of memory.   Observations/Objective: The patient appears to be alert and oriented at the time of the telephone evaluation.  The speech is well enunciated, not aphasic.  Assessment and  Plan: 1.  Benign essential tremor  2.  Mild memory disorder  The patient has continued to have troubles with tremors, she could not tolerate higher doses of Toprol.  Gabapentin in low dose will be added taking 100 mg 3 times daily, she will call for any dose adjustments.  She will continue the Toprol at the current dose, she does not wish to go on any medication for memory at this time.  This will need to be followed over time.   Follow Up Instructions: Follow-up in 6 months.   I discussed the assessment and treatment plan with the patient. The patient was provided an opportunity to ask questions and all were answered. The patient agreed with the plan and demonstrated an understanding of the instructions.   The patient was advised to call back or seek an in-person evaluation if the symptoms worsen or if the condition fails to improve as anticipated.  I provided 21 minutes of non-face-to-face time during this encounter.   York Spaniel, MD

## 2019-02-11 NOTE — Patient Instructions (Signed)
Start gabapentin 100 mg 3 times daily, call for any dose adjustments.

## 2019-02-18 ENCOUNTER — Ambulatory Visit: Payer: Medicare Other | Admitting: Sports Medicine

## 2019-02-19 DIAGNOSIS — L089 Local infection of the skin and subcutaneous tissue, unspecified: Secondary | ICD-10-CM | POA: Insufficient documentation

## 2019-02-19 DIAGNOSIS — L723 Sebaceous cyst: Secondary | ICD-10-CM | POA: Insufficient documentation

## 2019-03-10 DIAGNOSIS — R634 Abnormal weight loss: Secondary | ICD-10-CM | POA: Insufficient documentation

## 2019-03-10 DIAGNOSIS — D573 Sickle-cell trait: Secondary | ICD-10-CM | POA: Insufficient documentation

## 2019-03-10 DIAGNOSIS — D696 Thrombocytopenia, unspecified: Secondary | ICD-10-CM | POA: Insufficient documentation

## 2019-03-14 DIAGNOSIS — E559 Vitamin D deficiency, unspecified: Secondary | ICD-10-CM | POA: Insufficient documentation

## 2019-03-18 DIAGNOSIS — I951 Orthostatic hypotension: Secondary | ICD-10-CM | POA: Insufficient documentation

## 2019-04-08 ENCOUNTER — Ambulatory Visit: Payer: Medicare Other | Admitting: Sports Medicine

## 2019-10-15 ENCOUNTER — Emergency Department (HOSPITAL_COMMUNITY)
Admission: EM | Admit: 2019-10-15 | Discharge: 2019-10-15 | Disposition: A | Discharge status: Home / Self Care | Payer: Medicare Other | Attending: Emergency Medicine | Admitting: Emergency Medicine

## 2019-10-15 ENCOUNTER — Encounter (HOSPITAL_COMMUNITY): Payer: Self-pay | Admitting: Emergency Medicine

## 2019-10-15 ENCOUNTER — Emergency Department (HOSPITAL_COMMUNITY): Payer: Medicare Other

## 2019-10-15 ENCOUNTER — Other Ambulatory Visit: Payer: Self-pay

## 2019-10-15 DIAGNOSIS — M25551 Pain in right hip: Secondary | ICD-10-CM | POA: Diagnosis present

## 2019-10-15 DIAGNOSIS — I10 Essential (primary) hypertension: Secondary | ICD-10-CM | POA: Insufficient documentation

## 2019-10-15 DIAGNOSIS — Z79899 Other long term (current) drug therapy: Secondary | ICD-10-CM | POA: Insufficient documentation

## 2019-10-15 DIAGNOSIS — Z96641 Presence of right artificial hip joint: Secondary | ICD-10-CM | POA: Insufficient documentation

## 2019-10-15 MED ORDER — TRAMADOL HCL 50 MG PO TABS: 50.0000 mg | ORAL_TABLET | Freq: Four times a day (QID) | ORAL | 0 refills | Status: DC | PRN

## 2019-10-15 MED ORDER — TRAMADOL HCL 50 MG PO TABS
50.0000 mg | ORAL_TABLET | Freq: Once | ORAL | Status: AC
  Administered 2019-10-15: 50 mg via ORAL
  Filled 2019-10-15: qty 1

## 2019-10-15 NOTE — ED Notes (Signed)
Patient verbalizes understanding of discharge instructions. Opportunity for questioning and answers were provided. Armband removed by staff, pt discharged from ED. Pt. ambulatory and discharged home.  

## 2019-10-15 NOTE — ED Triage Notes (Signed)
Pt c/o right hip pain onset earlier tonight.  Pt st's she had a hip replacement in 2011 and hip gets sore sometimes.  Pt denies any falls or injury to hip

## 2019-10-15 NOTE — Discharge Instructions (Addendum)
Please follow up with your doctor for recheck of right hip pain in 2-3 days. Take Tramadol as directed as needed.   Return to the emergency department with any new or concerning symptoms.

## 2019-10-15 NOTE — ED Provider Notes (Signed)
[facility] EMERGENCY DEPARTMENT Provider Note   CSN: [id number] Arrival date & time: 10/15/19  0001     History   Chief Complaint Chief Complaint  Patient presents with  . Hip Pain    HPI Sheri Brown is a 83 y.o. female.     Patient to ED with c/o right hip pain since earlier in the evening. She states she bent over and had sudden, severe, sharp pain in the hip without fall or injury. She had a hip replacement in 2011 and states the pain reminds her of the pain she had at that time. No abdominal pain, numbness of the right leg, fever, nausea.   The history is provided by the patient and a relative. No language interpreter was used.  Hip Pain Pertinent negatives include no chest pain, no abdominal pain and no shortness of breath.    Past Medical History:  Diagnosis Date  . Arthritis    OA hands  . Benign essential tremor 08/24/2017  . Complication of anesthesia    "doesnt need alot of anesthesia"  . GERD (gastroesophageal reflux disease)   . Hypertension 03/06/2016  . Memory difficulties 08/24/2017  . PONV (postoperative nausea and vomiting)   . Right inguinal hernia 09/07/2017    Patient Active Problem List   Diagnosis Date Noted  . Right inguinal hernia 09/07/2017  . Benign essential tremor 08/24/2017  . Memory difficulties 08/24/2017  . Shortness of breath 08/07/2016  . Anemia 03/06/2016  . Dizziness 03/06/2016  . History of hyperlipidemia 03/06/2016  . Hypertension 03/06/2016    Past Surgical History:  Procedure Laterality Date  . ABDOMINAL HYSTERECTOMY    . BLADDER SUSPENSION    . BUNIONECTOMY    . CARDIAC CATHETERIZATION N/A 08/08/2016   Procedure: Right Heart Cath;  Surgeon: Adrian Prows, MD;  Location: [facility];  Service: Cardiovascular;  Laterality: N/A;  . EYE SURGERY     cataract  . INGUINAL HERNIA REPAIR Right 09/07/2017   Procedure: OPEN REPAIR RIGHT INGUINAL HERNIA WITH MESH;  Surgeon: Fanny Skates, MD;  Location:  [facility];  Service: General;  Laterality: Right;  . INSERTION OF MESH Right 09/07/2017   Procedure: INSERTION OF MESH;  Surgeon: Fanny Skates, MD;  Location: [facility];  Service: General;  Laterality: Right;  . JOINT REPLACEMENT Right      OB History   No obstetric history on file.      Home Medications    Prior to Admission medications   Medication Sig Start Date End Date Taking? Authorizing Provider  gabapentin (NEURONTIN) 100 MG capsule Take 1 capsule (100 mg total) by mouth 3 (three) times daily. 02/11/19   Kathrynn Ducking, MD  HYDROcodone-acetaminophen (NORCO) 5-325 MG tablet Take 1 tablet by mouth every 6 (six) hours as needed for moderate pain or severe pain. 09/07/17   Fanny Skates, MD  memantine [facility]) 5 MG tablet Take 1 tablet daily for one week, then take 1 tablet twice daily for one week, then take 1 tablet in the morning and 2 in the evening for one week, then take 2 tablets twice daily 07/26/18   Kathrynn Ducking, MD  metoprolol succinate (TOPROL-XL) 25 MG 24 hr tablet Take 1 tablet (25 mg total) by mouth every evening. Patient taking differently: Take 12.5 mg by mouth every evening.  07/26/18   Kathrynn Ducking, MD    Family History Family History  Problem Relation Age of Onset  . Hypertension Mother   .  Cancer Father        bladder    Social History Social History   Tobacco Use  . Smoking status: Never Smoker  . Smokeless tobacco: Never Used  Substance Use Topics  . Alcohol use: No  . Drug use: No     Allergies   Aricept [donepezil hcl], Diazepam, and Gluten meal   Review of Systems Review of Systems  Constitutional: Negative for fever.  Respiratory: Negative for shortness of breath.   Cardiovascular: Negative for chest pain.  Gastrointestinal: Negative for abdominal pain and nausea.  Musculoskeletal:       See HPI  Skin: Negative for color change.  Neurological: Negative for numbness.     Physical  Exam Updated Vital Signs BP (!) 155/118   Pulse 79   Temp 98.1 F (36.7 C) (Oral)   Resp 16   Ht 5\' 5"  (1.651 m)   Wt 59.9 kg   SpO2 99%   BMI 21.97 kg/m   Physical Exam Vitals signs and nursing note reviewed.  Constitutional:      Appearance: She is well-developed.  Neck:     Musculoskeletal: Normal range of motion.  Cardiovascular:     Pulses: Normal pulses.  Pulmonary:     Effort: Pulmonary effort is normal.  Abdominal:     Palpations: Abdomen is soft.     Tenderness: There is no abdominal tenderness.  Musculoskeletal:     Comments: Right hip ROM limited by significant pain. No visible deformity. Distal pulses intact. No shortening or rotation of the right LE. Tender anterior proximal thigh near inguinal fold.   Skin:    General: Skin is warm and dry.  Neurological:     Mental Status: She is alert and oriented to person, place, and time.      ED Treatments / Results  Labs (all labs ordered are listed, but only abnormal results are displayed) Labs Reviewed - No data to display  EKG None  Radiology No results found.  Procedures Procedures (including critical care time)  Medications Ordered in ED Medications  traMADol (ULTRAM) tablet 50 mg (has no administration in time range)     Initial Impression / Assessment and Plan / ED Course  I have reviewed the triage vital signs and the nursing notes.  Pertinent labs & imaging results that were available during my care of the patient were reviewed by me and considered in my medical decision making (see chart for details).        Patient to ED with sudden onset right hip pain after bending over, without fall. H/o hip replacement 2011.   There is no deformity on exam.  Feel with reproducible tenderness anterior to joint, no evidence infection, no vascular compromise, no bony deformity/dislocation, that pain is likely muscular. She states she feels improved with tramadol. She has been seen by Dr. 2012 and  is felt appropriate for discharge home.   Final Clinical Impressions(s) / ED Diagnoses   Final diagnoses:  None   1. Right hip pain  ED Discharge Orders    None       Eudelia Bunch, Elpidio Anis 10/15/19 10/17/19    [id number], MD 10/15/19 [phone]

## 2020-02-03 ENCOUNTER — Other Ambulatory Visit: Payer: Self-pay

## 2020-02-03 ENCOUNTER — Ambulatory Visit (INDEPENDENT_AMBULATORY_CARE_PROVIDER_SITE_OTHER): Payer: Medicare Other | Admitting: Sports Medicine

## 2020-02-03 ENCOUNTER — Encounter: Payer: Self-pay | Admitting: Sports Medicine

## 2020-02-03 VITALS — Temp 97.3°F

## 2020-02-03 DIAGNOSIS — M79675 Pain in left toe(s): Secondary | ICD-10-CM | POA: Diagnosis not present

## 2020-02-03 DIAGNOSIS — M79674 Pain in right toe(s): Secondary | ICD-10-CM

## 2020-02-03 DIAGNOSIS — B351 Tinea unguium: Secondary | ICD-10-CM

## 2020-02-03 NOTE — Progress Notes (Signed)
Patient ID: [name], female   DOB: [birth year], 84 y.o.   MRN: [id number]  Subjective: Sheri Brown is a 84 y.o. female patient seen today in office with complaint of painful thickened and elongated toenails; unable to trim.  Patient denies any changes with medicines or health since last visit. Reports that she has been staying home due to COVID. Patient has no other pedal complaints at this time.   Patient Active Problem List   Diagnosis Date Noted  . Right inguinal hernia 09/07/2017  . Benign essential tremor 08/24/2017  . Memory difficulties 08/24/2017  . Shortness of breath 08/07/2016  . Anemia 03/06/2016  . Dizziness 03/06/2016  . History of hyperlipidemia 03/06/2016  . Hypertension 03/06/2016    Current Outpatient Medications on File Prior to Visit  Medication Sig Dispense Refill  . diclofenac (VOLTAREN) 75 MG EC tablet Take by mouth.    . gabapentin (NEURONTIN) 100 MG capsule Take 1 capsule (100 mg total) by mouth 3 (three) times daily. 90 capsule 3  . HYDROcodone-acetaminophen (NORCO) 5-325 MG tablet Take 1 tablet by mouth every 6 (six) hours as needed for moderate pain or severe pain. 20 tablet 0  . ibuprofen (ADVIL) 200 MG tablet Take by mouth.    . memantine (NAMENDA) 5 MG tablet Take 1 tablet daily for one week, then take 1 tablet twice daily for one week, then take 1 tablet in the morning and 2 in the evening for one week, then take 2 tablets twice daily 70 tablet 0  . metoprolol succinate (TOPROL-XL) 25 MG 24 hr tablet Take 1 tablet (25 mg total) by mouth every evening. (Patient taking differently: Take 12.5 mg by mouth every evening. ) 90 tablet 3  . tiZANidine (ZANAFLEX) 2 MG tablet Take 2 mg by mouth every 8 (eight) hours as needed.    . traMADol (ULTRAM) 50 MG tablet Take 1 tablet (50 mg total) by mouth every 6 (six) hours as needed. 10 tablet 0   No current facility-administered medications on file prior to visit.    Allergies  Allergen Reactions  . Aricept  [Donepezil Hcl]     Pt stated it caused dizziness, nausea  . Diazepam Other (See Comments)    Mental disturbance.  . Gluten Meal Nausea Only and Other (See Comments)    Severe gastric upset-flatulence. Severe gastric upset-flatulence.    Objective: Physical Exam  General: Well developed, nourished, no acute distress, awake, alert and oriented x 3  Vascular: Dorsalis pedis artery 2/4 bilateral, Posterior tibial artery 1/4 bilateral, skin temperature warm to warm proximal to distal bilateral lower extremities, no varicosities, pedal hair present bilateral.  Neurological: Gross sensation present via light touch bilateral.   Dermatological: Skin is warm, dry, and supple bilateral, Nails 1-10 are tender, extremely long, thick, and discolored with moderate to severe subungal debris and discoloration, no webspace macerations present bilateral, no open lesions present bilateral, + minimal hyperkeratotic tissue present at right 2nd toe distal tuft. No signs of infection bilateral.  Musculoskeletal: Hammertoes and Residual bunion deformities noted bilateral; had bunionectomy years ago. Muscular strength within normal limits without pain on range of motion. No pain with calf compression bilateral.  Assessment and Plan:  Problem List Items Addressed This Visit    None    Visit Diagnoses    Onychomycosis    -  Primary   Toe pain, bilateral         -Examined patient.  -Re-Discussed treatment options for painful mycotic nails -Nails x10 were trimmed  using sterile nail nipper without incident; patient does not like her nails to be smoothed in office and prefers to file her nails herself at home -Continue with vicks to nails may try twice a day  -Patient to return in 3 months for nail trim or sooner if symptoms worsen.  [name], DPM

## 2020-03-23 ENCOUNTER — Ambulatory Visit: Payer: Medicare Other | Attending: Physician Assistant | Admitting: Physical Therapy

## 2020-03-23 ENCOUNTER — Encounter: Payer: Self-pay | Admitting: Physical Therapy

## 2020-03-23 ENCOUNTER — Other Ambulatory Visit: Payer: Self-pay

## 2020-03-23 DIAGNOSIS — M6281 Muscle weakness (generalized): Secondary | ICD-10-CM | POA: Diagnosis present

## 2020-03-23 DIAGNOSIS — R2681 Unsteadiness on feet: Secondary | ICD-10-CM

## 2020-03-23 NOTE — Therapy (Signed)
[facility] [address] [place], [place], [address] Phone: [phone]   Fax:  [phone]  Physical Therapy Evaluation  Patient Details  Name: Sheri Brown MRN: [id number] Date of Birth: 1936-02-27 Referring Provider (PT): [name], [place]    Encounter Date: 03/23/2020  PT End of Session - 03/23/20 1248    Visit Number  1    Number of Visits  13    Date for PT Re-Evaluation  05/04/20    Authorization Type  MCR: Kx mod at 15th visit    Progress Note Due on Visit  10    PT Start Time  1100    PT Stop Time  1145    PT Time Calculation (min)  45 min    Activity Tolerance  Patient tolerated treatment well    Behavior During Therapy  [facility] for tasks assessed/performed       Past Medical History:  Diagnosis Date  . Arthritis    OA hands  . Benign essential tremor 08/24/2017  . Complication of anesthesia    "doesnt need alot of anesthesia"  . GERD (gastroesophageal reflux disease)   . Hypertension 03/06/2016  . Memory difficulties 08/24/2017  . PONV (postoperative nausea and vomiting)   . Right inguinal hernia 09/07/2017    Past Surgical History:  Procedure Laterality Date  . ABDOMINAL HYSTERECTOMY    . BLADDER SUSPENSION    . BUNIONECTOMY    . CARDIAC CATHETERIZATION N/A 08/08/2016   Procedure: Right Heart Cath;  Surgeon: Yates Decamp, MD;  Location: [facility];  Service: Cardiovascular;  Laterality: N/A;  . EYE SURGERY     cataract  . INGUINAL HERNIA REPAIR Right 09/07/2017   Procedure: OPEN REPAIR RIGHT INGUINAL HERNIA WITH MESH;  Surgeon: Claud Kelp, MD;  Location: [facility];  Service: General;  Laterality: Right;  . INSERTION OF MESH Right 09/07/2017   Procedure: INSERTION OF MESH;  Surgeon: Claud Kelp, MD;  Location: [facility];  Service: General;  Laterality: Right;  . JOINT REPLACEMENT Right     There were no vitals filed for this visit.   Subjective  Assessment - 03/23/20 1108    Subjective  pt is 84 y.o with CC of general weakness that started when she noticed having issues getting onto her bed that started in March no preceding event. She reports since then her legs have been progressivley getting weak. She denies any pain int he legs and reports some soreness in the back on the L only.    Limitations  Standing;Lifting    How long can you sit comfortably?  unlimited    How long can you stand comfortably?  5 min    How long can you walk comfortably?  5 min    Diagnostic tests  10/15/2019 x-ray IMPRESSION:Right hip replacement without acute abnormality.    Patient Stated Goals  to be able to mobilize self, to try and walk without SPC if able,    Currently in Pain?  Yes    Pain Score  2    took ibuprofen this morning with pain at 8/10   Pain Location  Back    Pain Orientation  Left    Pain Descriptors / Indicators  Aching;Throbbing    Pain Type  Chronic pain    Pain Onset  More than a month ago    Pain Frequency  Intermittent    Aggravating Factors   walking/ standing, getting out of bed inthe AM  Pain Relieving Factors  ibuprofen    Effect of Pain on Daily Activities  limited standing/ walking    Multiple Pain Sites  Yes    Pain Score  0    Pain Location  Leg    Pain Orientation  Right;Left    Pain Type  Other (Comment)   general weakness   Pain Onset  More than a month ago    Pain Frequency  Rarely    Aggravating Factors   standing / walking    Pain Relieving Factors  sitting and resting         [facility] PT Assessment - 03/23/20 1116      Assessment   Medical Diagnosis  general weakness    Referring Provider (PT)  [name], PA-C     Onset Date/Surgical Date  --   March 2021   Hand Dominance  Right    Next MD Visit  unsure of when next appointment    Prior Therapy  no      Precautions   Precautions  None      Restrictions   Weight Bearing Restrictions  No      Balance Screen   Has the patient fallen  in the past 6 months  Yes    How many times?  1    Has the patient had a decrease in activity level because of a fear of falling?   No    Is the patient reluctant to leave their home because of a fear of falling?   No      Home Environment   Living Environment  Private residence    Living Arrangements  Alone    Type of Home  Apartment   condo   Home Access  Ramped entrance    Home Layout  One level    Home Equipment  [place] - single point;Walker - 2 wheels      Prior Function   Level of Independence  Independent    Vocation  Retired    Leisure  playing piano,       Cognition   Overall Cognitive Status  Within Functional Limits for tasks assessed      Observation/Other Assessments   Focus on Therapeutic Outcomes (FOTO)   not set-up in FOTO      Posture/Postural Control   Posture/Postural Control  Postural limitations    Postural Limitations  Rounded Shoulders;Forward head      ROM / Strength   AROM / PROM / Strength  AROM;Strength;PROM      AROM   Overall AROM   Within functional limits for tasks performed      Strength   Strength Assessment Site  Hip;Knee;Ankle    Right/Left Hip  Right;Left    Right Hip Flexion  4-/5    Right Hip Extension  3/5    Right Hip ABduction  3/5    Left Hip Flexion  4-/5    Left Hip Extension  3+/5    Left Hip ABduction  3+/5    Right/Left Knee  Right;Left    Right Knee Flexion  4/5    Right Knee Extension  4/5    Left Knee Flexion  4/5    Left Knee Extension  4/5    Right/Left Ankle  Right;Left    Right Ankle Dorsiflexion  4/5    Right Ankle Plantar Flexion  4/5    Right Ankle Inversion  4-/5    Right Ankle Eversion  4-/5  Left Ankle Dorsiflexion  4/5    Left Ankle Plantar Flexion  4/5    Left Ankle Inversion  4-/5    Left Ankle Eversion  4-/5      Palpation   Palpation comment  TTP along the L SIJ and L lumbar paraspinals      Ambulation/Gait   Ambulation/Gait  Yes    Assistive device  Straight cane    Gait Pattern   Step-through pattern;Decreased stride length;Trendelenburg;Antalgic;Decreased trunk rotation;Trunk flexed      Standardized Balance Assessment   Standardized Balance Assessment  Timed Up and Go Test      Timed Up and Go Test   TUG Comments  26 seconds without AD                Objective measurements completed on examination: See above findings.      [facility] Adult PT Treatment/Exercise - 03/23/20 1116      Exercises   Exercises  Knee/Hip      Lumbar Exercises: Stretches   Lower Trunk Rotation Limitations  1 x 20                PT Short Term Goals - 03/23/20 1314      PT SHORT TERM GOAL #1   Title  pt to be I with inital HEP    Time  3    Period  Weeks    Status  New    Target Date  04/13/20        PT Long Term Goals - 03/23/20 1314      PT LONG TERM GOAL #1   Title  increase Gross LE strength to >/= 4+/5 to promote hip/ knee stability with prolonged walking/ standing    Time  6    Period  Weeks    Status  New    Target Date  05/04/20      PT LONG TERM GOAL #2   Title  increase walking/ standing for >/= 30 min for functional endurance required for in home and community ambulation    Time  6    Period  Weeks    Status  New    Target Date  05/04/20      PT LONG TERM GOAL #3   Title  decrease TUG time to </= 15 seconds to maximize functional mobility    Time  6    Period  Weeks    Status  New    Target Date  05/04/20      PT LONG TERM GOAL #4   Title  pt to be I with all HEP given as of last visit to maintain and progress current level of function    Time  6    Period  Weeks    Status  New    Target Date  05/04/20             Plan - 03/23/20 1252    Clinical Impression Statement  pt presents to OPPT with CC or general weakness in bil LE starting back in March with specific MOI. she has functional hip/ knee ROM with general weakness noted in bil LE. She demonstrates antalgic gait pattern with limited stride and narrow BOS. She would  benefit from physical therapy to improve gross LE strength, improve stability/ safety and maximize overlal function by addressing the deficits listed.    Personal Factors and Comorbidities  Age;Sex;Comorbidity 1    Comorbidities  hx of arthritis,    Examination-Activity Limitations  Lift;Stand;Locomotion Level    Stability/Clinical Decision Making  Evolving/Moderate complexity    Clinical Decision Making  Moderate    Rehab Potential  Good    PT Frequency  2x / week    PT Duration  6 weeks    PT Treatment/Interventions  ADLs/Self Care Home Management;Cryotherapy;Electrical Stimulation;Iontophoresis 4mg /ml Dexamethasone;Moist Heat;Ultrasound;Neuromuscular re-education;Patient/family education;Therapeutic exercise;Passive range of motion;Dry needling;Taping;Manual techniques;Therapeutic activities    PT Next Visit Plan  review/ update HEP PRN, gross hip knee strengthening, BERG balance assessment,    PT Home Exercise Plan  [id number] - bridge, clam shell, supine marching, lower trunk rotation, LAQ    Consulted and Agree with Plan of Care  Patient       Patient will benefit from skilled therapeutic intervention in order to improve the following deficits and impairments:  Improper body mechanics, Increased muscle spasms, Decreased strength, Abnormal gait, Pain, Decreased balance, Decreased activity tolerance, Decreased endurance, Postural dysfunction, Decreased range of motion  Visit Diagnosis: Muscle weakness (generalized)  Unsteadiness on feet     Problem List Patient Active Problem List   Diagnosis Date Noted  . Right inguinal hernia 09/07/2017  . Benign essential tremor 08/24/2017  . Memory difficulties 08/24/2017  . Shortness of breath 08/07/2016  . Anemia 03/06/2016  . Dizziness 03/06/2016  . History of hyperlipidemia 03/06/2016  . Hypertension 03/06/2016   Lulu Riding PT, DPT, LAT, ATC  03/23/20  1:20 PM      [facility] [address] [place], [place], [address] Phone: [phone]   Fax:  [phone]  Name: Sheri Brown MRN: [id number] Date of Birth: 1936-08-08

## 2020-03-24 ENCOUNTER — Ambulatory Visit: Payer: Medicare Other | Admitting: Physical Therapy

## 2020-03-24 DIAGNOSIS — R2681 Unsteadiness on feet: Secondary | ICD-10-CM

## 2020-03-24 DIAGNOSIS — M6281 Muscle weakness (generalized): Secondary | ICD-10-CM | POA: Diagnosis not present

## 2020-03-24 NOTE — Therapy (Signed)
Decreased range of motion  Visit Diagnosis: Muscle weakness (generalized)  Unsteadiness on feet     Problem List Patient Active Problem List   Diagnosis Date Noted  .  Right inguinal hernia 09/07/2017  . Benign essential tremor 08/24/2017  . Memory difficulties 08/24/2017  . Shortness of breath 08/07/2016  . Anemia 03/06/2016  . Dizziness 03/06/2016  . History of hyperlipidemia 03/06/2016  . Hypertension 03/06/2016    Dorene Ar, PTA 03/24/2020, 2:08 PM  [facility] [address] [place], [place], [address] Phone: [phone]   Fax:  [phone]  Name: Sheri Brown MRN: [id number] Date of Birth: [birth year]  [facility] [address] [place], [place], [address] Phone: [phone]   Fax:  [phone]  Physical Therapy Treatment  Patient Details  Name: Sheri Brown MRN: [id number] Date of Birth: Mar 26, 1936 Referring Provider (PT): [name], [place]    Encounter Date: 03/24/2020  PT End of Session - 03/24/20 1318    Visit Number  2    Number of Visits  13    Date for PT Re-Evaluation  05/04/20    Authorization Type  MCR: Kx mod at 15th visit    PT Start Time  1315    PT Stop Time  1400    PT Time Calculation (min)  45 min       Past Medical History:  Diagnosis Date  . Arthritis    OA hands  . Benign essential tremor 08/24/2017  . Complication of anesthesia    "doesnt need alot of anesthesia"  . GERD (gastroesophageal reflux disease)   . Hypertension 03/06/2016  . Memory difficulties 08/24/2017  . PONV (postoperative nausea and vomiting)   . Right inguinal hernia 09/07/2017    Past Surgical History:  Procedure Laterality Date  . ABDOMINAL HYSTERECTOMY    . BLADDER SUSPENSION    . BUNIONECTOMY    . CARDIAC CATHETERIZATION N/A 08/08/2016   Procedure: Right Heart Cath;  Surgeon: Yates Decamp, MD;  Location: [facility] INVASIVE [facility] LAB;  Service: Cardiovascular;  Laterality: N/A;  . EYE SURGERY     cataract  . INGUINAL HERNIA REPAIR Right 09/07/2017   Procedure: OPEN REPAIR RIGHT INGUINAL HERNIA WITH MESH;  Surgeon: Claud Kelp, MD;  Location: [facility];  Service: General;  Laterality: Right;  . INSERTION OF MESH Right 09/07/2017   Procedure: INSERTION OF MESH;  Surgeon: Claud Kelp, MD;  Location: [facility];  Service: General;  Laterality: Right;  . JOINT REPLACEMENT Right     There were no vitals filed for this visit.  Subjective Assessment - 03/24/20 1320    Subjective  I had to get an UBER for transportation today. My appointment needs to be 3 days out before regular transportation can  pick me up.                       [facility] Adult PT Treatment/Exercise - 03/24/20 0001      Standardized Balance Assessment   Standardized Balance Assessment  Berg Balance Test      Berg Balance Test   Sit to Stand  Able to stand  independently using hands    Standing Unsupported  Able to stand safely 2 minutes    Sitting with Back Unsupported but Feet Supported on Floor or Stool  Able to sit safely and securely 2 minutes    Stand to Sit  Controls descent by using hands    Transfers  Able to transfer safely, definite need of hands    Standing Unsupported with Eyes Closed  Able to stand 10 seconds safely    Standing Ubsupported with Feet Together  Able to place feet together independently and stand 1 minute safely    From Standing, Reach Forward with Outstretched Arm  Can reach forward >12 cm safely (5")    From Standing Position, Pick up Object from Floor  Able to pick up shoe, needs supervision    From Standing Position, Turn to Look Behind Over each Shoulder  Looks behind one side only/other side shows less weight shift    Turn 360 Degrees  Decreased range of motion  Visit Diagnosis: Muscle weakness (generalized)  Unsteadiness on feet     Problem List Patient Active Problem List   Diagnosis Date Noted  .  Right inguinal hernia 09/07/2017  . Benign essential tremor 08/24/2017  . Memory difficulties 08/24/2017  . Shortness of breath 08/07/2016  . Anemia 03/06/2016  . Dizziness 03/06/2016  . History of hyperlipidemia 03/06/2016  . Hypertension 03/06/2016    Dorene Ar, PTA 03/24/2020, 2:08 PM  [facility] [address] [place], [place], [address] Phone: [phone]   Fax:  [phone]  Name: Sheri Brown MRN: [id number] Date of Birth: [birth year]

## 2020-04-06 ENCOUNTER — Encounter: Payer: Self-pay | Admitting: Physical Therapy

## 2020-04-06 ENCOUNTER — Other Ambulatory Visit: Payer: Self-pay

## 2020-04-06 ENCOUNTER — Ambulatory Visit: Payer: Medicare Other | Admitting: Physical Therapy

## 2020-04-06 DIAGNOSIS — R2681 Unsteadiness on feet: Secondary | ICD-10-CM

## 2020-04-06 DIAGNOSIS — M6281 Muscle weakness (generalized): Secondary | ICD-10-CM

## 2020-04-06 NOTE — Therapy (Signed)
[place], [place], [address] Phone: [phone]   Fax:  [phone]  Physical Therapy Treatment  Patient Details  Name: Sheri Brown MRN: [id number] Date of Birth: 1936/10/11 Referring Provider (PT): Heywood Bene, [place]    Encounter Date: 04/06/2020  PT End of Session - 04/06/20 1002    Visit Number  3    Number of Visits  13    Date for PT Re-Evaluation  05/04/20    Authorization Type  MCR: Kx mod at 15th visit    PT Start Time  1003    PT Stop Time  1047    PT Time Calculation (min)  44 min    Activity Tolerance  Patient tolerated treatment well   slight limitation with Rt hip grinding and pain   Behavior During Therapy  [facility] for tasks assessed/performed       Past Medical History:  Diagnosis Date  . Arthritis    OA hands  . Benign essential tremor 08/24/2017  . Complication of anesthesia    "doesnt need alot of anesthesia"  . GERD (gastroesophageal reflux disease)   . Hypertension 03/06/2016  . Memory difficulties 08/24/2017  . PONV (postoperative nausea and vomiting)   . Right inguinal hernia 09/07/2017    Past Surgical History:  Procedure Laterality Date  . ABDOMINAL HYSTERECTOMY    . BLADDER SUSPENSION    . BUNIONECTOMY    . CARDIAC CATHETERIZATION N/A 08/08/2016   Procedure: Right Heart Cath;  Surgeon: Adrian Prows, MD;  Location: [place] [facility];  Service: Cardiovascular;  Laterality: N/A;  . EYE SURGERY     cataract  . INGUINAL HERNIA REPAIR Right 09/07/2017   Procedure: OPEN REPAIR RIGHT INGUINAL HERNIA WITH MESH;  Surgeon: Fanny Skates, MD;  Location: [place];  Service: General;  Laterality: Right;  . INSERTION OF MESH Right 09/07/2017   Procedure: INSERTION OF MESH;  Surgeon: Fanny Skates, MD;  Location: [place];  Service: General;  Laterality: Right;  . JOINT REPLACEMENT Right     There were no vitals filed for this  visit.  Subjective Assessment - 04/06/20 1003    Subjective  Pt reports she is doing her HEP , she is having pain with lower body rotation stretching    Patient Stated Goals  to be able to mobilize self, to try and walk without SPC if able,    Currently in Pain?  No/denies   did have pain with LTR and started having to take her pain meds.                       [facility] Adult PT Treatment/Exercise - 04/06/20 0001      Lumbar Exercises: Stretches   Single Knee to Chest Stretch  2 reps;20 seconds   pain and popping in Rt hip with this     Lumbar Exercises: Supine   Bridge  20 reps    Isometric Hip Flexion  10 reps   5 secd holds     Knee/Hip Exercises: Aerobic   Nustep  L5x5'   VC for LE alignment - brings knees together     Knee/Hip Exercises: Seated   Sit to Sand  10 reps;with UE support   red band around knees - VC for form and to have full ext      Knee/Hip Exercises: Supine   Short Arc Quad Sets  Strengthening;Both;3 sets;10 reps    Short Arc  Hypertension 03/06/2016    Jeral Pinch PT   04/06/2020, 10:54 AM  [facility] [address] [place], [place], [address] Phone: [phone]   Fax:  [phone]  Name: Sheri Brown MRN: [id number] Date of Birth: June 07, 1936  Hypertension 03/06/2016    Jeral Pinch PT   04/06/2020, 10:54 AM  [facility] [address] [place], [place], [address] Phone: [phone]   Fax:  [phone]  Name: Sheri Brown MRN: [id number] Date of Birth: June 07, 1936

## 2020-04-08 ENCOUNTER — Encounter: Payer: Medicare Other | Admitting: Physical Therapy

## 2020-04-09 ENCOUNTER — Other Ambulatory Visit: Payer: Self-pay

## 2020-04-09 ENCOUNTER — Ambulatory Visit: Payer: Medicare Other | Admitting: Physical Therapy

## 2020-04-09 ENCOUNTER — Encounter: Payer: Self-pay | Admitting: Physical Therapy

## 2020-04-09 DIAGNOSIS — M6281 Muscle weakness (generalized): Secondary | ICD-10-CM | POA: Diagnosis not present

## 2020-04-09 DIAGNOSIS — R2681 Unsteadiness on feet: Secondary | ICD-10-CM

## 2020-04-09 NOTE — Therapy (Signed)
[facility] [address] [place], [place], [address] Phone: [phone]   Fax:  [phone]  Physical Therapy Treatment  Patient Details  Name: Sheri Brown MRN: [id number] Date of Birth: 17-Aug-1936 Referring Provider (PT): [name], [place]    Encounter Date: 04/09/2020  PT End of Session - 04/09/20 1021    Visit Number  4    Number of Visits  13    Date for PT Re-Evaluation  05/04/20    Authorization Type  MCR: Kx mod at 15th visit    PT Start Time  1021    PT Stop Time  1101    PT Time Calculation (min)  40 min    Activity Tolerance  Patient tolerated treatment well    Behavior During Therapy  [facility] for tasks assessed/performed       Past Medical History:  Diagnosis Date  . Arthritis    OA hands  . Benign essential tremor 08/24/2017  . Complication of anesthesia    "doesnt need alot of anesthesia"  . GERD (gastroesophageal reflux disease)   . Hypertension 03/06/2016  . Memory difficulties 08/24/2017  . PONV (postoperative nausea and vomiting)   . Right inguinal hernia 09/07/2017    Past Surgical History:  Procedure Laterality Date  . ABDOMINAL HYSTERECTOMY    . BLADDER SUSPENSION    . BUNIONECTOMY    . CARDIAC CATHETERIZATION N/A 08/08/2016   Procedure: Right Heart Cath;  Surgeon: Yates Decamp, MD;  Location: [facility] INVASIVE CV LAB;  Service: Cardiovascular;  Laterality: N/A;  . EYE SURGERY     cataract  . INGUINAL HERNIA REPAIR Right 09/07/2017   Procedure: OPEN REPAIR RIGHT INGUINAL HERNIA WITH MESH;  Surgeon: Claud Kelp, MD;  Location: [facility];  Service: General;  Laterality: Right;  . INSERTION OF MESH Right 09/07/2017   Procedure: INSERTION OF MESH;  Surgeon: Claud Kelp, MD;  Location:  SUR[facility]ERY [facility];  Service: General;  Laterality: Right;  . JOINT REPLACEMENT Right     There were no vitals filed for this visit.  Subjective Assessment - 04/09/20 1021    Subjective   Pt states she is doing well, sometimes had soreness after doing her HEP, it doesn't last lone    Patient Stated Goals  to be able to mobilize self, to try and walk without SPC if able,    Currently in Pain?  No/denies   doesn't need pain medication during the day, only at night to help her sleep                       [facility] Adult PT Treatment/Exercise - 04/09/20 0001      Lumbar Exercises: Stretches   Piriformis Stretch  Left;Right;2 reps;20 seconds   seated figure 4     Knee/Hip Exercises: Aerobic   Nustep  L5x6''   LE only, cues for LE alignment, some lateral thigh pain      Knee/Hip Exercises: Standing   Forward Step Up  Both;2 sets;10 reps;Step Height: 6"    Forward Step Up Limitations  HHA , VC to keep knee alighnment    Other Standing Knee Exercises  3x10 steps side stepping each direction, then BWD walk and marching. bilat HHA for BWD and marching      Knee/Hip Exercises: Seated   Other Seated Knee/Hip Exercises  clams with green band, 20 bilat, 10 single  PT Short Term Goals - 03/23/20 1314      PT SHORT TERM GOAL #1   Title  pt to be I with inital HEP    Time  3    Period  Weeks    Status  New    Target Date  04/13/20        PT Long Term Goals - 03/23/20 1314      PT LONG TERM GOAL #1   Title  increase Gross LE strength to >/= 4+/5 to promote hip/ knee stability with prolonged walking/ standing    Time  6    Period  Weeks    Status  New    Target Date  05/04/20      PT LONG TERM GOAL #2   Title  increase walking/ standing for >/= 30 min for functional endurance required for in home and community ambulation    Time  6    Period  Weeks    Status  New    Target Date  05/04/20      PT LONG TERM GOAL #3   Title  decrease TUG time to </= 15 seconds to maximize functional mobility    Time  6    Period  Weeks    Status  New    Target Date  05/04/20      PT LONG TERM GOAL #4   Title  pt to be I with all HEP given as  of last visit to maintain and progress current level of function    Time  6    Period  Weeks    Status  New    Target Date  05/04/20            Plan - 04/09/20 1101    Clinical Impression Statement  Cressie required some asisstance with higher level exercise of FWD step ups, especially with Lt LE leading and with some balance work in standing.  She would benefit from continued therapy to strenghten her legs and improve balance to decreased risk of falls.    Rehab Potential  Good    PT Frequency  2x / week    PT Duration  6 weeks    PT Treatment/Interventions  ADLs/Self Care Home Management;Cryotherapy;Electrical Stimulation;Iontophoresis 4mg /ml Dexamethasone;Moist Heat;Ultrasound;Neuromuscular re-education;Patient/family education;Therapeutic exercise;Passive range of motion;Dry needling;Taping;Manual techniques;Therapeutic activities    PT Next Visit Plan  LE strengthening and balance work    Oncologist with Plan of Care  Patient       Patient will benefit from skilled therapeutic intervention in order to improve the following deficits and impairments:  Improper body mechanics, Increased muscle spasms, Decreased strength, Abnormal gait, Pain, Decreased balance, Decreased activity tolerance, Decreased endurance, Postural dysfunction, Decreased range of motion  Visit Diagnosis: Muscle weakness (generalized)  Unsteadiness on feet     Problem List Patient Active Problem List   Diagnosis Date Noted  . Right inguinal hernia 09/07/2017  . Benign essential tremor 08/24/2017  . Memory difficulties 08/24/2017  . Shortness of breath 08/07/2016  . Anemia 03/06/2016  . Dizziness 03/06/2016  . History of hyperlipidemia 03/06/2016  . Hypertension 03/06/2016    Jeral Pinch PT  04/09/2020, 11:03 AM  [facility] [address] [place], [place], [address] Phone: [phone]   Fax:  [phone]  Name: Jashay Roddy MRN:  [id number] Date of Birth: 27-Mar-1936

## 2020-04-12 ENCOUNTER — Ambulatory Visit: Payer: Medicare Other | Admitting: Physical Therapy

## 2020-04-12 ENCOUNTER — Encounter: Payer: Self-pay | Admitting: Physical Therapy

## 2020-04-12 DIAGNOSIS — M6281 Muscle weakness (generalized): Secondary | ICD-10-CM | POA: Diagnosis not present

## 2020-04-12 DIAGNOSIS — R2681 Unsteadiness on feet: Secondary | ICD-10-CM

## 2020-04-12 NOTE — Therapy (Signed)
Time  3    Period  Weeks    Status  New    Target Date  04/13/20        PT Long Term Goals - 03/23/20 1314      PT LONG TERM GOAL #1   Title  increase Gross LE strength to >/= 4+/5 to promote hip/ knee stability with prolonged walking/ standing    Time  6    Period  Weeks    Status  New    Target Date  05/04/20      PT LONG TERM GOAL #2   Title  increase walking/ standing for >/= 30 min for functional endurance required for in home and community ambulation    Time  6    Period  Weeks    Status  New    Target Date  05/04/20      PT LONG TERM GOAL #3   Title  decrease TUG time to </= 15 seconds to maximize functional mobility    Time  6    Period  Weeks    Status  New    Target Date  05/04/20      PT LONG TERM GOAL #4   Title  pt to be I with all HEP given as of last visit to maintain and progress current level of function    Time  6    Period  Weeks    Status  New    Target Date  05/04/20            Plan - 04/12/20 1053    Clinical Impression Statement  Shiela reports low  back pain and also c/o knee pain with standing therex. STW to bilat lateral quds was helpful today. Continued with LE strengthening and she tolerated the session well.    PT Next Visit Plan  LE strengthening and balance work    PT Home Exercise Plan  [id number] - bridge, clam shell, supine marching, lower trunk rotation, LAQ       Patient will benefit from skilled therapeutic intervention in order to improve the following deficits and impairments:  Improper body mechanics, Increased muscle spasms, Decreased strength, Abnormal gait, Pain, Decreased balance, Decreased activity tolerance, Decreased endurance, Postural dysfunction, Decreased range of motion  Visit Diagnosis: Muscle weakness (generalized)  Unsteadiness on feet     Problem List Patient Active Problem List   Diagnosis Date Noted  . Right inguinal hernia 09/07/2017  . Benign essential tremor 08/24/2017  . Memory difficulties 08/24/2017  . Shortness of breath 08/07/2016  . Anemia 03/06/2016  . Dizziness 03/06/2016  . History of hyperlipidemia 03/06/2016  . Hypertension 03/06/2016    Sherrie Mustache, PTA 04/12/2020, 11:09 AM  [facility] [address] [place], [place], [address] Phone: [phone]   Fax:  [phone]  Name: Victor Granados MRN: [id number] Date of Birth: [birth year]  [place], [place], [address] Phone: [phone]   Fax:  [phone]  Physical Therapy Treatment  Patient Details  Name: Nathalee Smarr MRN: [id number] Date of Birth: [birth year] Referring Provider (PT): Heywood Bene, [place]    Encounter Date: 04/12/2020  PT End of Session - 04/12/20 1022    Visit Number  5    Date for PT Re-Evaluation  05/04/20    Authorization Type  MCR: Kx mod at 15th visit    PT Start Time  1016    PT Stop Time  1059    PT Time Calculation (min)  43 min       Past Medical History:  Diagnosis Date  . Arthritis    OA hands  . Benign essential tremor 08/24/2017  . Complication of anesthesia    "doesnt need alot of anesthesia"  . GERD (gastroesophageal reflux disease)   . Hypertension 03/06/2016  . Memory difficulties 08/24/2017  . PONV (postoperative nausea and vomiting)   . Right inguinal hernia 09/07/2017    Past Surgical History:  Procedure Laterality Date  . ABDOMINAL HYSTERECTOMY    . BLADDER SUSPENSION    . BUNIONECTOMY    . CARDIAC CATHETERIZATION N/A 08/08/2016   Procedure: Right Heart Cath;  Surgeon: Adrian Prows, MD;  Location: [place] [facility];  Service: Cardiovascular;  Laterality: N/A;  . EYE SURGERY     cataract  . INGUINAL HERNIA REPAIR Right 09/07/2017   Procedure: OPEN REPAIR RIGHT INGUINAL HERNIA WITH MESH;  Surgeon: Fanny Skates, MD;  Location: [place];  Service: General;  Laterality: Right;  . INSERTION OF MESH Right 09/07/2017   Procedure: INSERTION OF MESH;  Surgeon: Fanny Skates, MD;  Location: [place];  Service: General;  Laterality: Right;  . JOINT REPLACEMENT Right     There were no vitals filed for this visit.  Subjective Assessment - 04/12/20 1035    Subjective  My back and knees were sore this morning, better after hot shower.    Currently in Pain?  Yes    Pain Score  3     Pain Location  Back    Pain Orientation  Lower    Pain Descriptors / Indicators  Sore;Throbbing    Pain Type  Chronic pain                        [facility] Adult PT Treatment/Exercise - 04/12/20 0001      Knee/Hip Exercises: Standing   Other Standing Knee Exercises  3x10 steps side stepping each direction, then BWD walk and marching. bilat HHA for BWD and marching, mini squats- needs cues to hip hinge      Knee/Hip Exercises: Seated   Long Arc Quad  20 reps    Other Seated Knee/Hip Exercises  clams with green band, 20 bilat, 10 single    Sit to Sand  10 reps   needs UE to raise, cues to lower slowly      Knee/Hip Exercises: Sidelying   Clams  15 reps regular and reverse bilat       Manual Therapy   Manual therapy comments  patella mobs and STW to Vastus LAteralis                PT Short Term Goals - 03/23/20 1314      PT SHORT TERM GOAL #1   Title  pt to be I with inital HEP

## 2020-04-14 ENCOUNTER — Encounter: Payer: Self-pay | Admitting: Physical Therapy

## 2020-04-14 ENCOUNTER — Other Ambulatory Visit: Payer: Self-pay

## 2020-04-14 ENCOUNTER — Ambulatory Visit: Payer: Medicare Other | Admitting: Physical Therapy

## 2020-04-14 DIAGNOSIS — R2681 Unsteadiness on feet: Secondary | ICD-10-CM

## 2020-04-14 DIAGNOSIS — M6281 Muscle weakness (generalized): Secondary | ICD-10-CM

## 2020-04-14 NOTE — Therapy (Signed)
[facility] [address] [place], [place], [address] Phone: [phone]   Fax:  [phone]  Physical Therapy Treatment  Patient Details  Name: Sheri Brown MRN: [id number] Date of Birth: [birth year] Referring Provider (PT): [name], [place]    Encounter Date: 04/14/2020  PT End of Session - 04/14/20 1324    Visit Number  6    Number of Visits  13    Date for PT Re-Evaluation  05/04/20    Authorization Type  MCR: Kx mod at 15th visit    PT Start Time  1315    PT Stop Time  1400    PT Time Calculation (min)  45 min       Past Medical History:  Diagnosis Date  . Arthritis    OA hands  . Benign essential tremor 08/24/2017  . Complication of anesthesia    "doesnt need alot of anesthesia"  . GERD (gastroesophageal reflux disease)   . Hypertension 03/06/2016  . Memory difficulties 08/24/2017  . PONV (postoperative nausea and vomiting)   . Right inguinal hernia 09/07/2017    Past Surgical History:  Procedure Laterality Date  . ABDOMINAL HYSTERECTOMY    . BLADDER SUSPENSION    . BUNIONECTOMY    . CARDIAC CATHETERIZATION N/A 08/08/2016   Procedure: Right Heart Cath;  Surgeon: Yates Decamp, MD;  Location: [facility] INVASIVE CV [facility];  Service: Cardiovascular;  Laterality: N/A;  . EYE SURGERY     cataract  . INGUINAL HERNIA REPAIR Right 09/07/2017   Procedure: OPEN REPAIR RIGHT INGUINAL HERNIA WITH MESH;  Surgeon: Claud Kelp, MD;  Location: [facility];  Service: General;  Laterality: Right;  . INSERTION OF MESH Right 09/07/2017   Procedure: INSERTION OF MESH;  Surgeon: Claud Kelp, MD;  Location: [facility];  Service: General;  Laterality: Right;  . JOINT REPLACEMENT Right     There were no vitals filed for this visit.  Subjective Assessment - 04/14/20 1323    Subjective  A little discomfort in the lower back and a little in the back of the legs.                         [facility] Adult PT Treatment/Exercise - 04/14/20 0001      Ambulation/Gait   Ambulation Distance (Feet)  392 Feet    Assistive device  Straight cane    Pre-Gait Activities  2 min walk test : 1106ft     Gait Comments  and 10 sec       Lumbar Exercises: Supine   Bridge  20 reps      Knee/Hip Exercises: Stretches   Theme park manager Limitations  runners stretch 2 x 20 sec       Knee/Hip Exercises: Aerobic   Nustep  L5x6''   LE only, cues for LE alignment, some lateral thigh pain      Knee/Hip Exercises: Standing   Other Standing Knee Exercises  stanidng hip flexion x 10 each, hip abduction x 10 each - UE support , mini squats- cues for hip hinge.       Knee/Hip Exercises: Sidelying   Clams  20 regular and reverse   left only, knee locked up and needed to stop               PT Short Term Goals - 03/23/20 1314      PT SHORT TERM GOAL #1   Title  pt to  be I with inital HEP    Time  3    Period  Weeks    Status  New    Target Date  04/13/20        PT Long Term Goals - 03/23/20 1314      PT LONG TERM GOAL #1   Title  increase Gross LE strength to >/= 4+/5 to promote hip/ knee stability with prolonged walking/ standing    Time  6    Period  Weeks    Status  New    Target Date  05/04/20      PT LONG TERM GOAL #2   Title  increase walking/ standing for >/= 30 min for functional endurance required for in home and community ambulation    Time  6    Period  Weeks    Status  New    Target Date  05/04/20      PT LONG TERM GOAL #3   Title  decrease TUG time to </= 15 seconds to maximize functional mobility    Time  6    Period  Weeks    Status  New    Target Date  05/04/20      PT LONG TERM GOAL #4   Title  pt to be I with all HEP given as of last visit to maintain and progress current level of function    Time  6    Period  Weeks    Status  New    Target Date  05/04/20            Plan - 04/14/20 1356    Clinical  Impression Statement  Sheri Brown reports a low level of pain in back and back of knees. Instructed her in calf stretches. Perfomed gait in clinic with SPC and 2 minute walk test was 160 ft. Able to complete 5 minutes in clinic with mild SOB. She was encouraged to walk inside her home 5 minutes 2 x per day. Progressing towards walking and standing goals.    PT Next Visit Plan  LE strengthening and balance work    PT [name] - bridge, clam shell, supine marching, lower trunk rotation, LAQ       Patient will benefit from skilled therapeutic intervention in order to improve the following deficits and impairments:  Improper body mechanics, Increased muscle spasms, Decreased strength, Abnormal gait, Pain, Decreased balance, Decreased activity tolerance, Decreased endurance, Postural dysfunction, Decreased range of motion  Visit Diagnosis: Muscle weakness (generalized)  Unsteadiness on feet     Problem List Patient Active Problem List   Diagnosis Date Noted  . Right inguinal hernia 09/07/2017  . Benign essential tremor 08/24/2017  . Memory difficulties 08/24/2017  . Shortness of breath 08/07/2016  . Anemia 03/06/2016  . Dizziness 03/06/2016  . History of hyperlipidemia 03/06/2016  . Hypertension 03/06/2016    Dorene Ar, PTA 04/14/2020, 2:11 PM  [facility] [address] [place], [place], [address] Phone: [phone]   Fax:  [phone]  Name: Sheri Brown MRN: [id number] Date of Birth: 1936/08/20

## 2020-04-20 ENCOUNTER — Ambulatory Visit: Payer: Medicare Other | Admitting: Physical Therapy

## 2020-04-22 ENCOUNTER — Encounter: Payer: Medicare Other | Admitting: Physical Therapy

## 2020-04-23 ENCOUNTER — Encounter: Payer: Self-pay | Admitting: Physical Therapy

## 2020-04-23 ENCOUNTER — Ambulatory Visit: Payer: Medicare Other | Attending: Physician Assistant | Admitting: Physical Therapy

## 2020-04-23 ENCOUNTER — Other Ambulatory Visit: Payer: Self-pay

## 2020-04-23 DIAGNOSIS — M6281 Muscle weakness (generalized): Secondary | ICD-10-CM | POA: Diagnosis present

## 2020-04-23 DIAGNOSIS — R2681 Unsteadiness on feet: Secondary | ICD-10-CM | POA: Diagnosis present

## 2020-04-23 NOTE — Therapy (Signed)
[place], [place], [address] Phone: [phone]   Fax:  [phone]  Physical Therapy Treatment  Patient Details  Name: Sheri Brown MRN: [id number] Date of Birth: [birth year] Referring Provider (PT): Heywood Bene, [place]    Encounter Date: 04/23/2020  PT End of Session - 04/23/20 1031    Visit Number  7    Number of Visits  13    Date for PT Re-Evaluation  05/04/20    Authorization Type  MCR: Kx mod at 15th visit    PT Start Time  1028    PT Stop Time  1123    PT Time Calculation (min)  55 min    Activity Tolerance  Patient tolerated treatment well    Behavior During Therapy  [facility] for tasks assessed/performed       Past Medical History:  Diagnosis Date  . Arthritis    OA hands  . Benign essential tremor 08/24/2017  . Complication of anesthesia    "doesnt need alot of anesthesia"  . GERD (gastroesophageal reflux disease)   . Hypertension 03/06/2016  . Memory difficulties 08/24/2017  . PONV (postoperative nausea and vomiting)   . Right inguinal hernia 09/07/2017    Past Surgical History:  Procedure Laterality Date  . ABDOMINAL HYSTERECTOMY    . BLADDER SUSPENSION    . BUNIONECTOMY    . CARDIAC CATHETERIZATION N/A 08/08/2016   Procedure: Right Heart Cath;  Surgeon: Adrian Prows, MD;  Location: [place] [facility];  Service: Cardiovascular;  Laterality: N/A;  . EYE SURGERY     cataract  . INGUINAL HERNIA REPAIR Right 09/07/2017   Procedure: OPEN REPAIR RIGHT INGUINAL HERNIA WITH MESH;  Surgeon: Fanny Skates, MD;  Location: [place];  Service: General;  Laterality: Right;  . INSERTION OF MESH Right 09/07/2017   Procedure: INSERTION OF MESH;  Surgeon: Fanny Skates, MD;  Location: [place];  Service: General;  Laterality: Right;  . JOINT REPLACEMENT Right     There were no vitals filed for this visit.  Subjective Assessment - 04/23/20 1031    Subjective   pt reports she didin't take her hip medicine and is afraid she may start hurting. scheduled for a steriod shot 6/24 for her knees    Patient Stated Goals  to be able to mobilize self, to try and walk without SPC if able,    Currently in Pain?  No/denies                        [facility] Adult PT Treatment/Exercise - 04/23/20 0001      Lumbar Exercises: Stretches   Lower Trunk Rotation  30 seconds      Lumbar Exercises: Supine   Pelvic Tilt  20 reps    Clam  20 reps   blue band   Clam Limitations  bilat and alternating    Bridge  20 reps      Knee/Hip Exercises: Aerobic   Nustep  L5x7''   VC to keep knees apart     Knee/Hip Exercises: Supine   Other Supine Knee/Hip Exercises  10 reps hooklying side bend reaching to feet to work obliques      Knee/Hip Exercises: Sidelying   Other Sidelying Knee/Hip Exercises  2x10 bilat feet lifts - mermaids with minimal assistance while working left side       Modalities   Modalities  Moist Heat      Moist  03/06/2016  . Dizziness 03/06/2016  . History of hyperlipidemia 03/06/2016  . Hypertension  03/06/2016    Roderic Scarce PT  04/23/2020, 12:02 PM  [facility] [address] [place], [place], [address] Phone: [phone]   Fax:  [phone]  Name: Sheri Brown MRN: [id number] Date of Birth: [birth year]  [place], [place], [address] Phone: [phone]   Fax:  [phone]  Physical Therapy Treatment  Patient Details  Name: Sheri Brown MRN: [id number] Date of Birth: [birth year] Referring Provider (PT): Heywood Bene, [place]    Encounter Date: 04/23/2020  PT End of Session - 04/23/20 1031    Visit Number  7    Number of Visits  13    Date for PT Re-Evaluation  05/04/20    Authorization Type  MCR: Kx mod at 15th visit    PT Start Time  1028    PT Stop Time  1123    PT Time Calculation (min)  55 min    Activity Tolerance  Patient tolerated treatment well    Behavior During Therapy  [facility] for tasks assessed/performed       Past Medical History:  Diagnosis Date  . Arthritis    OA hands  . Benign essential tremor 08/24/2017  . Complication of anesthesia    "doesnt need alot of anesthesia"  . GERD (gastroesophageal reflux disease)   . Hypertension 03/06/2016  . Memory difficulties 08/24/2017  . PONV (postoperative nausea and vomiting)   . Right inguinal hernia 09/07/2017    Past Surgical History:  Procedure Laterality Date  . ABDOMINAL HYSTERECTOMY    . BLADDER SUSPENSION    . BUNIONECTOMY    . CARDIAC CATHETERIZATION N/A 08/08/2016   Procedure: Right Heart Cath;  Surgeon: Adrian Prows, MD;  Location: [place] [facility];  Service: Cardiovascular;  Laterality: N/A;  . EYE SURGERY     cataract  . INGUINAL HERNIA REPAIR Right 09/07/2017   Procedure: OPEN REPAIR RIGHT INGUINAL HERNIA WITH MESH;  Surgeon: Fanny Skates, MD;  Location: [place];  Service: General;  Laterality: Right;  . INSERTION OF MESH Right 09/07/2017   Procedure: INSERTION OF MESH;  Surgeon: Fanny Skates, MD;  Location: [place];  Service: General;  Laterality: Right;  . JOINT REPLACEMENT Right     There were no vitals filed for this visit.  Subjective Assessment - 04/23/20 1031    Subjective   pt reports she didin't take her hip medicine and is afraid she may start hurting. scheduled for a steriod shot 6/24 for her knees    Patient Stated Goals  to be able to mobilize self, to try and walk without SPC if able,    Currently in Pain?  No/denies                        [facility] Adult PT Treatment/Exercise - 04/23/20 0001      Lumbar Exercises: Stretches   Lower Trunk Rotation  30 seconds      Lumbar Exercises: Supine   Pelvic Tilt  20 reps    Clam  20 reps   blue band   Clam Limitations  bilat and alternating    Bridge  20 reps      Knee/Hip Exercises: Aerobic   Nustep  L5x7''   VC to keep knees apart     Knee/Hip Exercises: Supine   Other Supine Knee/Hip Exercises  10 reps hooklying side bend reaching to feet to work obliques      Knee/Hip Exercises: Sidelying   Other Sidelying Knee/Hip Exercises  2x10 bilat feet lifts - mermaids with minimal assistance while working left side       Modalities   Modalities  Moist Heat      Moist

## 2020-04-27 ENCOUNTER — Encounter: Payer: Self-pay | Admitting: Physical Therapy

## 2020-04-27 ENCOUNTER — Other Ambulatory Visit: Payer: Self-pay

## 2020-04-27 ENCOUNTER — Ambulatory Visit: Payer: Medicare Other | Admitting: Physical Therapy

## 2020-04-27 DIAGNOSIS — M6281 Muscle weakness (generalized): Secondary | ICD-10-CM | POA: Diagnosis not present

## 2020-04-27 DIAGNOSIS — R2681 Unsteadiness on feet: Secondary | ICD-10-CM

## 2020-04-27 NOTE — Therapy (Signed)
[place], [place], [address] Phone: [phone]   Fax:  [phone]  Physical Therapy Treatment  Patient Details  Name: Sheri Brown MRN: [id number] Date of Birth: [birth year] Referring Provider (PT): Heywood Bene, [place]    Encounter Date: 04/27/2020  PT End of Session - 04/27/20 1100    Visit Number  8    Number of Visits  13    Date for PT Re-Evaluation  05/04/20    Authorization Type  MCR: Kx mod at 15th visit    Progress Note Due on Visit  10    PT Start Time  1100    PT Stop Time  1140    PT Time Calculation (min)  40 min       Past Medical History:  Diagnosis Date  . Arthritis    OA hands  . Benign essential tremor 08/24/2017  . Complication of anesthesia    "doesnt need alot of anesthesia"  . GERD (gastroesophageal reflux disease)   . Hypertension 03/06/2016  . Memory difficulties 08/24/2017  . PONV (postoperative nausea and vomiting)   . Right inguinal hernia 09/07/2017    Past Surgical History:  Procedure Laterality Date  . ABDOMINAL HYSTERECTOMY    . BLADDER SUSPENSION    . BUNIONECTOMY    . CARDIAC CATHETERIZATION N/A 08/08/2016   Procedure: Right Heart Cath;  Surgeon: Adrian Prows, MD;  Location: [place] [facility];  Service: Cardiovascular;  Laterality: N/A;  . EYE SURGERY     cataract  . INGUINAL HERNIA REPAIR Right 09/07/2017   Procedure: OPEN REPAIR RIGHT INGUINAL HERNIA WITH MESH;  Surgeon: Fanny Skates, MD;  Location: [place];  Service: General;  Laterality: Right;  . INSERTION OF MESH Right 09/07/2017   Procedure: INSERTION OF MESH;  Surgeon: Fanny Skates, MD;  Location: [place];  Service: General;  Laterality: Right;  . JOINT REPLACEMENT Right     There were no vitals filed for this visit.  Subjective Assessment - 04/27/20 1101    Subjective  "The hip is doing pretty good. I still know its going to spasm on me when I bend  forward."    Currently in Pain?  No/denies         [facility] PT Assessment - 04/27/20 0001      Assessment   Medical Diagnosis  general weakness    Referring Provider (PT)  Heywood Bene, PA-C       6 Minute Walk- Baseline   6 Minute Walk- Baseline  yes      6 minute walk test results    Aerobic Endurance Distance Walked  651                    [facility] Adult PT Treatment/Exercise - 04/27/20 0001      Knee/Hip Exercises: Standing   Functional Squat  1 set;10 reps   picking up and placing ball down.    Functional Squat Limitations  using 14 inch step, pt required verbal cues for proper form      Knee/Hip Exercises: Seated   Other Seated Knee/Hip Exercises  clam with green theraband 1 x 20 bil LE    Sit to Sand  2 sets;10 reps   with ball between knees, 1 set with red band around knee              PT Short Term Goals - 03/23/20 1314      PT  [facility] [address] [place], [place], [address] Phone: [phone]   Fax:  [phone]  Name: Vermell Madrid MRN: [id number] Date of Birth: 08/15/1936  [place], [place], [address] Phone: [phone]   Fax:  [phone]  Physical Therapy Treatment  Patient Details  Name: Sheri Brown MRN: [id number] Date of Birth: [birth year] Referring Provider (PT): Heywood Bene, [place]    Encounter Date: 04/27/2020  PT End of Session - 04/27/20 1100    Visit Number  8    Number of Visits  13    Date for PT Re-Evaluation  05/04/20    Authorization Type  MCR: Kx mod at 15th visit    Progress Note Due on Visit  10    PT Start Time  1100    PT Stop Time  1140    PT Time Calculation (min)  40 min       Past Medical History:  Diagnosis Date  . Arthritis    OA hands  . Benign essential tremor 08/24/2017  . Complication of anesthesia    "doesnt need alot of anesthesia"  . GERD (gastroesophageal reflux disease)   . Hypertension 03/06/2016  . Memory difficulties 08/24/2017  . PONV (postoperative nausea and vomiting)   . Right inguinal hernia 09/07/2017    Past Surgical History:  Procedure Laterality Date  . ABDOMINAL HYSTERECTOMY    . BLADDER SUSPENSION    . BUNIONECTOMY    . CARDIAC CATHETERIZATION N/A 08/08/2016   Procedure: Right Heart Cath;  Surgeon: Adrian Prows, MD;  Location: [place] [facility];  Service: Cardiovascular;  Laterality: N/A;  . EYE SURGERY     cataract  . INGUINAL HERNIA REPAIR Right 09/07/2017   Procedure: OPEN REPAIR RIGHT INGUINAL HERNIA WITH MESH;  Surgeon: Fanny Skates, MD;  Location: [facility];  Service: General;  Laterality: Right;  . INSERTION OF MESH Right 09/07/2017   Procedure: INSERTION OF MESH;  Surgeon: Fanny Skates, MD;  Location: [place];  Service: General;  Laterality: Right;  . JOINT REPLACEMENT Right     There were no vitals filed for this visit.  Subjective Assessment - 04/27/20 1101    Subjective  "The hip is doing pretty good. I still know its going to spasm on me when I bend  forward."    Currently in Pain?  No/denies         [facility] PT Assessment - 04/27/20 0001      Assessment   Medical Diagnosis  general weakness    Referring Provider (PT)  Heywood Bene, PA-C       6 Minute Walk- Baseline   6 Minute Walk- Baseline  yes      6 minute walk test results    Aerobic Endurance Distance Walked  651                    [facility] Adult PT Treatment/Exercise - 04/27/20 0001      Knee/Hip Exercises: Standing   Functional Squat  1 set;10 reps   picking up and placing ball down.    Functional Squat Limitations  using 14 inch step, pt required verbal cues for proper form      Knee/Hip Exercises: Seated   Other Seated Knee/Hip Exercises  clam with green theraband 1 x 20 bil LE    Sit to Sand  2 sets;10 reps   with ball between knees, 1 set with red band around knee              PT Short Term Goals - 03/23/20 1314      PT

## 2020-04-29 ENCOUNTER — Encounter: Payer: Medicare Other | Admitting: Physical Therapy

## 2020-04-30 ENCOUNTER — Other Ambulatory Visit: Payer: Self-pay

## 2020-04-30 ENCOUNTER — Encounter: Payer: Self-pay | Admitting: Physical Therapy

## 2020-04-30 ENCOUNTER — Ambulatory Visit: Payer: Medicare Other | Admitting: Physical Therapy

## 2020-04-30 DIAGNOSIS — R2681 Unsteadiness on feet: Secondary | ICD-10-CM

## 2020-04-30 DIAGNOSIS — M6281 Muscle weakness (generalized): Secondary | ICD-10-CM

## 2020-04-30 NOTE — Therapy (Addendum)
[facility] [address] [place], [place], [address] Phone: [phone]   Fax:  [phone]  Physical Therapy Treatment / Discharge  Patient Details  Name: Sheri Brown MRN: [id number] Date of Birth: [birth year] Referring Provider (PT): [name], [place]    Encounter Date: 04/30/2020   PT End of Session - 04/30/20 1156    Visit Number 9    Number of Visits 13    Date for PT Re-Evaluation 05/04/20    Authorization Type MCR: Kx mod at 15th visit    PT Start Time 1145    PT Stop Time 1227    PT Time Calculation (min) 42 min           Past Medical History:  Diagnosis Date  . Arthritis    OA hands  . Benign essential tremor 08/24/2017  . Complication of anesthesia    "doesnt need alot of anesthesia"  . GERD (gastroesophageal reflux disease)   . Hypertension 03/06/2016  . Memory difficulties 08/24/2017  . PONV (postoperative nausea and vomiting)   . Right inguinal hernia 09/07/2017    Past Surgical History:  Procedure Laterality Date  . ABDOMINAL HYSTERECTOMY    . BLADDER SUSPENSION    . BUNIONECTOMY    . CARDIAC CATHETERIZATION N/A 08/08/2016   Procedure: Right Heart Cath;  Surgeon: Yates Decamp, MD;  Location: [facility];  Service: Cardiovascular;  Laterality: N/A;  . EYE SURGERY     cataract  . INGUINAL HERNIA REPAIR Right 09/07/2017   Procedure: OPEN REPAIR RIGHT INGUINAL HERNIA WITH MESH;  Surgeon: Claud Kelp, MD;  Location: [facility];  Service: General;  Laterality: Right;  . INSERTION OF MESH Right 09/07/2017   Procedure: INSERTION OF MESH;  Surgeon: Claud Kelp, MD;  Location: [facility];  Service: General;  Laterality: Right;  . JOINT REPLACEMENT Right     There were no vitals filed for this visit.       [facility] PT Assessment - 04/30/20 0001      Strength   Right Hip Flexion 4+/5    Left Hip Flexion 4/5    Right Knee Flexion 4+/5    Right Knee  Extension 4+/5    Left Knee Flexion 4/5    Left Knee Extension 4+/5      Timed Up and Go Test   TUG Comments 20 Sec without AD                          [facility] Adult PT Treatment/Exercise - 04/30/20 0001      Knee/Hip Exercises: Aerobic   Nustep L5 x 5 minutes UE/LE      Knee/Hip Exercises: Seated   Long Arc Quad 20 reps    Other Seated Knee/Hip Exercises clam with green theraband 1 x 20 bil LE    Marching 20 reps    Sit to Sand 2 sets;10 reps   with ball between knees, 1 set with red band around knee                   PT Short Term Goals - 04/30/20 1154      PT SHORT TERM GOAL #1   Title pt to be I with inital HEP    Time 3    Period Weeks    Status Achieved             PT Long Term Goals - 04/30/20 1213  PT LONG TERM GOAL #1   Title increase Gross LE strength to >/= 4+/5 to promote hip/ knee stability with prolonged walking/ standing    Baseline see objective measures    Time 6    Period Weeks    Status Partially Met      PT LONG TERM GOAL #2   Title increase walking/ standing for >/= 30 min for functional endurance required for in home and community ambulation    Baseline She reoports that the 6 minute walk test was about her maximum. Ambulation tolerance hsa improved "a little"    Time 6    Period Weeks    Status Not Met      PT LONG TERM GOAL #3   Title decrease TUG time to </= 15 seconds to maximize functional mobility    Baseline improved from 26 sec to 20 sec without AD    Time 6    Period Weeks    Status Not Met      PT LONG TERM GOAL #4   Title pt to be I with all HEP given as of last visit to maintain and progress current level of function    Time 6    Period Weeks    Status Achieved                 Plan - 04/30/20 1201    Clinical Impression Statement Pt reports a little improvement in standing walking tolerance. Her hip strength has improved. Her TUG improved however goal not met. She is independent with  her HEP and plans to continue on her own. She is agreeable to discharge today.    PT Treatment/Interventions ADLs/Self Care Home Management;Cryotherapy;Electrical Stimulation;Iontophoresis 4mg /ml Dexamethasone;Moist Heat;Ultrasound;Neuromuscular re-education;Patient/family education;Therapeutic exercise;Passive range of motion;Dry needling;Taping;Manual techniques;Therapeutic activities           Patient will benefit from skilled therapeutic intervention in order to improve the following deficits and impairments:  Improper body mechanics, Increased muscle spasms, Decreased strength, Abnormal gait, Pain, Decreased balance, Decreased activity tolerance, Decreased endurance, Postural dysfunction, Decreased range of motion  Visit Diagnosis: Muscle weakness (generalized)  Unsteadiness on feet     Problem List Patient Active Problem List   Diagnosis Date Noted  . Right inguinal hernia 09/07/2017  . Benign essential tremor 08/24/2017  . Memory difficulties 08/24/2017  . Shortness of breath 08/07/2016  . Anemia 03/06/2016  . Dizziness 03/06/2016  . History of hyperlipidemia 03/06/2016  . Hypertension 03/06/2016    Sherrie Mustache, PTA 04/30/2020, 12:27 PM  [facility] [address] [place], [place], [address] Phone: [phone]   Fax:  [phone]  Name: Sheri Brown MRN: [id number] Date of Birth: [birth year]           PHYSICAL THERAPY DISCHARGE SUMMARY  Visits from Start of Care: 9  Current functional level related to goals / functional outcomes: See goals   Remaining deficits: See assessment   Education / Equipment: HEP, theraband, posture,  Plan: Patient agrees to discharge.  Patient goals were partially met. Patient is being discharged due to meeting the stated rehab goals.  ?????        Kristoffer Leamon PT, DPT, LAT, ATC  06/01/20  9:21 AM

## 2020-05-11 ENCOUNTER — Ambulatory Visit: Payer: Medicare Other | Admitting: Sports Medicine

## 2020-06-15 ENCOUNTER — Ambulatory Visit: Payer: Medicare Other | Admitting: Podiatry

## 2020-07-08 ENCOUNTER — Ambulatory Visit (INDEPENDENT_AMBULATORY_CARE_PROVIDER_SITE_OTHER): Payer: Medicare Other | Admitting: Sports Medicine

## 2020-07-08 ENCOUNTER — Other Ambulatory Visit: Payer: Self-pay

## 2020-07-08 ENCOUNTER — Encounter: Payer: Self-pay | Admitting: Sports Medicine

## 2020-07-08 DIAGNOSIS — M79676 Pain in unspecified toe(s): Secondary | ICD-10-CM

## 2020-07-08 DIAGNOSIS — B351 Tinea unguium: Secondary | ICD-10-CM

## 2020-07-08 NOTE — Patient Instructions (Signed)
Vinegar soaks 1 cup of white distilled vinegar to 8 cups of warm water.  Soak 20 mins. May repeat soak two times per week.  If there is thickness to nails may file nails after soaks or after bath/shower with nail file and apply vicks or tea tree oil. Apply oil daily to nails after filing for the best result. 

## 2020-07-08 NOTE — Progress Notes (Signed)
Patient ID: Sheri Brown, female   DOB: January 16, 1936, 84 y.o.   MRN: [id number]  Subjective: Sheri Brown is a 84 y.o. female patient seen today in office with complaint of painful thickened and elongated toenails; unable to trim.  Patient denies any changes with medicines or health since last visit. Patient has no other pedal complaints at this time.   Patient Active Problem List   Diagnosis Date Noted  . Right inguinal hernia 09/07/2017  . Benign essential tremor 08/24/2017  . Memory difficulties 08/24/2017  . Shortness of breath 08/07/2016  . Anemia 03/06/2016  . Dizziness 03/06/2016  . History of hyperlipidemia 03/06/2016  . Hypertension 03/06/2016    Current Outpatient Medications on File Prior to Visit  Medication Sig Dispense Refill  . diclofenac (VOLTAREN) 75 MG EC tablet Take by mouth.    . gabapentin (NEURONTIN) 100 MG capsule Take 1 capsule (100 mg total) by mouth 3 (three) times daily. 90 capsule 3  . HYDROcodone-acetaminophen (NORCO) 5-325 MG tablet Take 1 tablet by mouth every 6 (six) hours as needed for moderate pain or severe pain. 20 tablet 0  . ibuprofen (ADVIL) 200 MG tablet Take by mouth.    . memantine (NAMENDA) 5 MG tablet Take 1 tablet daily for one week, then take 1 tablet twice daily for one week, then take 1 tablet in the morning and 2 in the evening for one week, then take 2 tablets twice daily 70 tablet 0  . metoprolol succinate (TOPROL-XL) 25 MG 24 hr tablet Take 1 tablet (25 mg total) by mouth every evening. (Patient taking differently: Take 12.5 mg by mouth every evening. ) 90 tablet 3  . tiZANidine (ZANAFLEX) 2 MG tablet Take 2 mg by mouth every 8 (eight) hours as needed.    . traMADol (ULTRAM) 50 MG tablet Take 1 tablet (50 mg total) by mouth every 6 (six) hours as needed. 10 tablet 0   No current facility-administered medications on file prior to visit.    Allergies  Allergen Reactions  . Aricept [Donepezil Hcl]     Pt stated it caused dizziness,  nausea  . Diazepam Other (See Comments)    Mental disturbance.  . Gluten Meal Nausea Only and Other (See Comments)    Severe gastric upset-flatulence. Severe gastric upset-flatulence.    Objective: Physical Exam  General: Well developed, nourished, no acute distress, awake, alert and oriented x 3  Vascular: Dorsalis pedis artery 2/4 bilateral, Posterior tibial artery 1/4 bilateral, skin temperature warm to warm proximal to distal bilateral lower extremities, no varicosities, pedal hair present bilateral.  Neurological: Gross sensation present via light touch bilateral.   Dermatological: Skin is warm, dry, and supple bilateral, Nails 1-10 are tender, extremely long, thick, and discolored with moderate to severe subungal debris and discoloration, no webspace macerations present bilateral, no open lesions present bilateral, + minimal hyperkeratotic tissue present at right 2nd toe distal tuft. No signs of infection bilateral.  Musculoskeletal: Hammertoes and Residual bunion deformities noted bilateral; had bunionectomy years ago. Muscular strength within normal limits without pain on range of motion. No pain with calf compression bilateral.  Assessment and Plan:  Problem List Items Addressed This Visit    None    Visit Diagnoses    Pain due to onychomycosis of toenail    -  Primary     -Examined patient.  -Re-Discussed treatment options for painful mycotic nails -Nails x10 were trimmed using sterile nail nipper without incident -Continue with vicks to nails and recommended vinegar  soaks -Patient to return in 3-4 months for nail trim or sooner if symptoms worsen.  [name], DPM

## 2020-07-15 ENCOUNTER — Encounter: Payer: Self-pay | Admitting: Neurology

## 2020-07-15 ENCOUNTER — Ambulatory Visit (INDEPENDENT_AMBULATORY_CARE_PROVIDER_SITE_OTHER): Payer: Medicare Other | Admitting: Neurology

## 2020-07-15 VITALS — BP 135/74 | HR 62 | Ht 65.0 in | Wt 134.0 lb

## 2020-07-15 DIAGNOSIS — R413 Other amnesia: Secondary | ICD-10-CM | POA: Diagnosis not present

## 2020-07-15 DIAGNOSIS — G250 Essential tremor: Secondary | ICD-10-CM | POA: Diagnosis not present

## 2020-07-15 MED ORDER — MEMANTINE HCL 5 MG PO TABS: ORAL_TABLET | ORAL | 0 refills | Status: DC

## 2020-07-15 MED ORDER — GABAPENTIN 100 MG PO CAPS: 100.0000 mg | ORAL_CAPSULE | Freq: Two times a day (BID) | ORAL | 5 refills | Status: DC

## 2020-07-15 NOTE — Patient Instructions (Addendum)
We will start Namenda titration working up to 10 mg twice daily, once you finish the titration, let me know I will send in the maintenance rx   Take 1 tablet daily for one week, then take 1 tablet twice daily for one week, then take 1 tablet in the morning and 2 in the evening for one week, then take 2 tablets twice daily  Try gabapentin 100 mg twice daily for tremor  See you back in 6 months   Memantine Tablets What is this medicine? MEMANTINE (MEM an teen) is used to treat dementia caused by Alzheimer's disease. This medicine may be used for other purposes; ask your health care provider or pharmacist if you have questions. COMMON BRAND NAME(S): Namenda What should I tell my health care provider before I take this medicine? They need to know if you have any of these conditions:  difficulty passing urine  kidney disease  liver disease  seizures  an unusual or allergic reaction to memantine, other medicines, foods, dyes, or preservatives  pregnant or trying to get pregnant  breast-feeding How should I use this medicine? Take this medicine by mouth with a glass of water. Follow the directions on the prescription label. You may take this medicine with or without food. Take your doses at regular intervals. Do not take your medicine more often than directed. Continue to take your medicine even if you feel better. Do not stop taking except on the advice of your doctor or health care professional. Talk to your pediatrician regarding the use of this medicine in children. Special care may be needed. Overdosage: If you think you have taken too much of this medicine contact a poison control center or emergency room at once. NOTE: This medicine is only for you. Do not share this medicine with others. What if I miss a dose? If you miss a dose, take it as soon as you can. If it is almost time for your next dose, take only that dose. Do not take double or extra doses. If you do not take your  medicine for several days, contact your health care provider. Your dose may need to be changed. What may interact with this medicine?  acetazolamide  amantadine  cimetidine  dextromethorphan  dofetilide  hydrochlorothiazide  ketamine  metformin  methazolamide  quinidine  ranitidine  sodium bicarbonate  triamterene This list may not describe all possible interactions. Give your health care provider a list of all the medicines, herbs, non-prescription drugs, or dietary supplements you use. Also tell them if you smoke, drink alcohol, or use illegal drugs. Some items may interact with your medicine. What should I watch for while using this medicine? Visit your doctor or health care professional for regular checks on your progress. Check with your doctor or health care professional if there is no improvement in your symptoms or if they get worse. You may get drowsy or dizzy. Do not drive, use machinery, or do anything that needs mental alertness until you know how this drug affects you. Do not stand or sit up quickly, especially if you are an older patient. This reduces the risk of dizzy or fainting spells. Alcohol can make you more drowsy and dizzy. Avoid alcoholic drinks. What side effects may I notice from receiving this medicine? Side effects that you should report to your doctor or health care professional as soon as possible:  allergic reactions like skin rash, itching or hives, swelling of the face, lips, or tongue  agitation or a  feeling of restlessness  depressed mood  dizziness  hallucinations  redness, blistering, peeling or loosening of the skin, including inside the mouth  seizures  vomiting Side effects that usually do not require medical attention (report to your doctor or health care professional if they continue or are bothersome):  constipation  diarrhea  headache  nausea  trouble sleeping This list may not describe all possible side effects.  Call your doctor for medical advice about side effects. You may report side effects to FDA at 1-800-FDA-1088. Where should I keep my medicine? Keep out of the reach of children. Store at room temperature between 15 degrees and 30 degrees C (59 degrees and 86 degrees F). Throw away any unused medicine after the expiration date. NOTE: This sheet is a summary. It may not cover all possible information. If you have questions about this medicine, talk to your doctor, pharmacist, or health care provider.  2020 Elsevier/Gold Standard (2013-08-25 14:10:42)

## 2020-07-15 NOTE — Progress Notes (Signed)
I have read the note, and I agree with the clinical assessment and plan.  Gudrun Axe K Abdulai Blaylock   

## 2020-07-15 NOTE — Progress Notes (Signed)
PATIENT: Sheri Brown DOB: June 03, 1936  REASON FOR VISIT: follow up HISTORY FROM: patient  HISTORY OF PRESENT ILLNESS: Today 07/15/20 Sheri Brown is an 84 year old female with history of essential tremor and memory disturbance.  She is on low-dose Toprol, cannot tolerate higher dose, dual benefit tremor and BP, filled by PCP.  Was prescribed gabapentin for tremor, does not take consistently.  Tremor worsens over time, most notable with handwriting with right handed, or eating occasionally.  Cannot tolerate Aricept, prescribed Namenda, did not take it.  Her granddaughter is living with her right now, Rachel-is a Teacher, early years/pre.  Her grandson helps with grocery shopping and errands.  She does her own ADLs, someone comes in 1 time weekly for housework.  Completed physical therapy, for general weakness, felt became deconditioned during the pandemic.  Using cane, 1 fall, dog tripped her.  MMSE 26/30 today.  Good and bad days with memory, feels more forgetful, has to be reminded.  Daughter manages finances.  Gait is limited by arthritis in her knees.  Does very limited driving to post office.  Presents today accompanied by her granddaughter.  HISTORY 02/11/2019 Dr. Anne Hahn: Sheri Brown is an 84 year old right-handed black female with a history of an essential tremor that appears to be somewhat worse with the right hand than the left.  The patient is on Toprol 25 mg tablets for this, but she could not tolerate the full dose and had to start splitting the tablets in half as she had increased problems with fatigue on higher dose.  The patient is still having significant problems with tremors, she has to use both hands to feed herself and to perform cooking activities.  The patient has difficulty with handwriting as well.  She does have some mild gait instability, she reports no recent falls.  The patient has had some mild memory issues, she could not tolerate Aricept in the past because of gastroenterological upset,  but she was given a prescription for Namenda and apparently did not take the drug for fear of side effects.  The patient lives with her grandson, occasionally her 2 granddaughters may come to live with her as well for short periods of time.  She is able to keep up with medications, appointments, she operates a motor vehicle without difficulty and she cooks for herself.  She has not given up any activities of daily living because of memory.   REVIEW OF SYSTEMS: Out of a complete 14 system review of symptoms, the patient complains only of the following symptoms, and all other reviewed systems are negative.  Memory loss, tremor  ALLERGIES: Allergies  Allergen Reactions  . Aricept [Donepezil Hcl]     Pt stated it caused dizziness, nausea  . Diazepam Other (See Comments)    Mental disturbance.  . Gluten Meal Nausea Only and Other (See Comments)    Severe gastric upset-flatulence. Severe gastric upset-flatulence.    HOME MEDICATIONS: Outpatient Medications Prior to Visit  Medication Sig Dispense Refill  . diclofenac (VOLTAREN) 75 MG EC tablet Take by mouth. Pt taking 1 tab twice a day    . ibuprofen (ADVIL) 200 MG tablet Take by mouth.    . memantine (NAMENDA) 5 MG tablet Take 1 tablet daily for one week, then take 1 tablet twice daily for one week, then take 1 tablet in the morning and 2 in the evening for one week, then take 2 tablets twice daily (Patient taking differently: Take 1 tablet daily for one week, then take 1 tablet  twice daily for one week, then take 1 tablet in the morning and 2 in the evening for one week, then take 2 tablets twice daily  Pt does not remember getting this medication.) 70 tablet 0  . metoprolol succinate (TOPROL-XL) 25 MG 24 hr tablet Take 1 tablet (25 mg total) by mouth every evening. (Patient taking differently: Take 12.5 mg by mouth every evening. Pt take 1 1/2 tab daily) 90 tablet 3  . gabapentin (NEURONTIN) 100 MG capsule Take 1 capsule (100 mg total) by  mouth 3 (three) times daily. 90 capsule 3  . HYDROcodone-acetaminophen (NORCO) 5-325 MG tablet Take 1 tablet by mouth every 6 (six) hours as needed for moderate pain or severe pain. 20 tablet 0  . tiZANidine (ZANAFLEX) 2 MG tablet Take 2 mg by mouth every 8 (eight) hours as needed.    . traMADol (ULTRAM) 50 MG tablet Take 1 tablet (50 mg total) by mouth every 6 (six) hours as needed. 10 tablet 0   No facility-administered medications prior to visit.    PAST MEDICAL HISTORY: Past Medical History:  Diagnosis Date  . Arthritis    OA hands  . Benign essential tremor 08/24/2017  . Complication of anesthesia    "doesnt need alot of anesthesia"  . GERD (gastroesophageal reflux disease)   . Hypertension 03/06/2016  . Memory difficulties 08/24/2017  . PONV (postoperative nausea and vomiting)   . Right inguinal hernia 09/07/2017    PAST SURGICAL HISTORY: Past Surgical History:  Procedure Laterality Date  . ABDOMINAL HYSTERECTOMY    . BLADDER SUSPENSION    . BUNIONECTOMY    . CARDIAC CATHETERIZATION N/A 08/08/2016   Procedure: Right Heart Cath;  Surgeon: Yates Decamp, MD;  Location: [facility] INVASIVE CV LAB;  Service: Cardiovascular;  Laterality: N/A;  . EYE SURGERY     cataract  . INGUINAL HERNIA REPAIR Right 09/07/2017   Procedure: OPEN REPAIR RIGHT INGUINAL HERNIA WITH MESH;  Surgeon: Claud Kelp, MD;  Location: [facility];  Service: General;  Laterality: Right;  . INSERTION OF MESH Right 09/07/2017   Procedure: INSERTION OF MESH;  Surgeon: Claud Kelp, MD;  Location: [facility];  Service: General;  Laterality: Right;  . JOINT REPLACEMENT Right     FAMILY HISTORY: Family History  Problem Relation Age of Onset  . Hypertension Mother   . Cancer Father        bladder    SOCIAL HISTORY: Social History   Socioeconomic History  . Marital status: Widowed    Spouse name: Not on file  . Number of children: 3  . Years of education: 16+  . Highest  education level: Not on file  Occupational History  . Not on file  Tobacco Use  . Smoking status: Never Smoker  . Smokeless tobacco: Never Used  Vaping Use  . Vaping Use: Never used  Substance and Sexual Activity  . Alcohol use: No  . Drug use: No  . Sexual activity: Not on file  Other Topics Concern  . Not on file  Social History Narrative   Lives alone   Right handed    Caffeine use: none   Social Determinants of Health   Financial Resource Strain:   . Difficulty of Paying Living Expenses: Not on file  Food Insecurity:   . Worried About Programme researcher, broadcasting/film/video in the Last Year: Not on file  . Ran Out of Food in the Last Year: Not on file  Transportation Needs:   .  Lack of Transportation (Medical): Not on file  . Lack of Transportation (Non-Medical): Not on file  Physical Activity:   . Days of Exercise per Week: Not on file  . Minutes of Exercise per Session: Not on file  Stress:   . Feeling of Stress : Not on file  Social Connections:   . Frequency of Communication with Friends and Family: Not on file  . Frequency of Social Gatherings with Friends and Family: Not on file  . Attends Religious Services: Not on file  . Active Member of Clubs or Organizations: Not on file  . Attends Banker Meetings: Not on file  . Marital Status: Not on file  Intimate Partner Violence:   . Fear of Current or Ex-Partner: Not on file  . Emotionally Abused: Not on file  . Physically Abused: Not on file  . Sexually Abused: Not on file   PHYSICAL EXAM  Vitals:   07/15/20 0829  BP: 135/74  Pulse: 62  Weight: 134 lb (60.8 kg)  Height: 5\' 5"  (1.651 m)   Body mass index is 22.3 kg/m.  Generalized: Well developed, in no acute distress  MMSE - Mini Mental State Exam 07/15/2020 07/26/2018 08/24/2017  Orientation to time 4 4 3   Orientation to Place 5 5 5   Registration 3 3 3   Attention/ Calculation 5 1 5   Recall 0 3 3  Language- name 2 objects 2 2 2   Language- repeat 1 1 1     Language- follow 3 step command 3 3 3   Language- read & follow direction 1 1 1   Write a sentence 1 1 0  Copy design 1 0 1  Total score 26 24 27    Neurological examination  Mentation: Alert oriented to time, place, history taking. Follows all commands speech and language fluent Cranial nerve II-XII: Pupils were equal round reactive to light. Extraocular movements were full, visual field were full on confrontational test. Facial sensation and strength were normal.  Head turning and shoulder shrug were normal and symmetric. Motor: No significant muscle weakness was noted Sensory: Sensory testing is intact to soft touch on all 4 extremities. No evidence of extinction is noted.  Coordination: Cerebellar testing reveals good finger-nose-finger and heel-to-shin bilaterally.  No tremor with finger-nose-finger noted.  Tremor with the right hand was reflected with handwriting sample and spiral drawing, mild to moderately. Gait and station: Gait is wide-based, cautious, can walk in the room with standby assistance, uses single-point cane in hallway Reflexes: Deep tendon reflexes are symmetric but depressed throughout  DIAGNOSTIC DATA (LABS, IMAGING, TESTING) - I reviewed patient records, labs, notes, testing and imaging myself where available.  No results found for: WBC, HGB, HCT, MCV, PLT No results found for: NA, K, CL, CO2, GLUCOSE, BUN, CREATININE, CALCIUM, PROT, ALBUMIN, AST, ALT, ALKPHOS, BILITOT, GFRNONAA, GFRAA No results found for: CHOL, HDL, LDLCALC, LDLDIRECT, TRIG, CHOLHDL No results found for: WNUU7O Lab Results  Component Value Date   VITAMINB12 1,471 (H) 08/24/2017   No results found for: TSH  ASSESSMENT AND PLAN 84 y.o. year old female  has a past medical history of Arthritis, Benign essential tremor (08/24/2017), Complication of anesthesia, GERD (gastroesophageal reflux disease), Hypertension (03/06/2016), Memory difficulties (08/24/2017), PONV (postoperative nausea and vomiting),  and Right inguinal hernia (09/07/2017). here with:  1.  Essential tremor -Mild to moderate, reflected in handwriting sample, otherwise not noted on exam -Continue Toprol XL 25 mg daily, from PCP -Restart gabapentin, 100 mg twice daily for tremor  2.  Memory  disturbance -MMSE 26/30 today -Could not tolerate Aricept -Start Namenda titration with 5 mg tablets, up to 10 mg BID -Once completed, if tolerating send in full dose tablets -Follow-up in 6 months or sooner if needed  I spent 30 minutes of face-to-face and non-face-to-face time with patient.  This included previsit chart review, lab review, study review, order entry, electronic health record documentation, patient education.  Sheri Brown, AGNP-C, DNP 07/15/2020, 8:46 AM [facility] [address] [place], [place] [address] [phone]

## 2020-10-07 ENCOUNTER — Other Ambulatory Visit: Payer: Self-pay

## 2020-10-07 ENCOUNTER — Encounter: Payer: Self-pay | Admitting: Sports Medicine

## 2020-10-07 ENCOUNTER — Ambulatory Visit (INDEPENDENT_AMBULATORY_CARE_PROVIDER_SITE_OTHER): Payer: Medicare Other | Admitting: Sports Medicine

## 2020-10-07 DIAGNOSIS — M79676 Pain in unspecified toe(s): Secondary | ICD-10-CM | POA: Diagnosis not present

## 2020-10-07 DIAGNOSIS — B351 Tinea unguium: Secondary | ICD-10-CM

## 2020-10-07 NOTE — Patient Instructions (Signed)
Vinegar soaks 1 cup of white distilled vinegar to 8 cups of warm water.  Soak 20 mins. May repeat soak two times per week.  If there is thickness to nails may file nails after soaks or after bath/shower with nail file and apply vicks vapor rub or tea tree oil. Apply oil daily to nails after filing for the best result.  

## 2020-10-07 NOTE — Progress Notes (Signed)
Patient ID: Sheri Brown, female   DOB: 10-16-1936, 84 y.o.   MRN: [id number]  Subjective: Sheri Brown is a 84 y.o. female patient seen today in office with complaint of painful thickened and elongated toenails; unable to trim.  Patient denies any changes with medicines or health since last visit had a flare of her arthritis and was put on Diclofenac which helped. Patient has no other pedal complaints at this time.   Patient Active Problem List   Diagnosis Date Noted  . Right inguinal hernia 09/07/2017  . Benign essential tremor 08/24/2017  . Memory difficulties 08/24/2017  . Shortness of breath 08/07/2016  . Anemia 03/06/2016  . Dizziness 03/06/2016  . History of hyperlipidemia 03/06/2016  . Hypertension 03/06/2016    Current Outpatient Medications on File Prior to Visit  Medication Sig Dispense Refill  . diclofenac (VOLTAREN) 75 MG EC tablet Take by mouth. Pt taking 1 tab twice a day    . gabapentin (NEURONTIN) 100 MG capsule Take 1 capsule (100 mg total) by mouth 2 (two) times daily. 60 capsule 5  . ibuprofen (ADVIL) 200 MG tablet Take by mouth.    . memantine (NAMENDA) 5 MG tablet Take 1 tablet daily for one week, then take 1 tablet twice daily for one week, then take 1 tablet in the morning and 2 in the evening for one week, then take 2 tablets twice daily 70 tablet 0  . metoprolol succinate (TOPROL-XL) 25 MG 24 hr tablet Take 1 tablet (25 mg total) by mouth every evening. (Patient taking differently: Take 12.5 mg by mouth every evening. Pt take 1 1/2 tab daily) 90 tablet 3   No current facility-administered medications on file prior to visit.    Allergies  Allergen Reactions  . Aricept [Donepezil Hcl]     Pt stated it caused dizziness, nausea  . Diazepam Other (See Comments)    Mental disturbance.  . Gluten Meal Nausea Only and Other (See Comments)    Severe gastric upset-flatulence. Severe gastric upset-flatulence.    Objective: Physical Exam  General: Well  developed, nourished, no acute distress, awake, alert and oriented x 3  Vascular: Dorsalis pedis artery 2/4 bilateral, Posterior tibial artery 1/4 bilateral, skin temperature warm to warm proximal to distal bilateral lower extremities, no varicosities, pedal hair present bilateral.  Neurological: Gross sensation present via light touch bilateral.   Dermatological: Skin is warm, dry, and supple bilateral, Nails 1-10 are tender, extremely long, thick, and discolored with moderate to severe subungal debris and discoloration, no webspace macerations present bilateral, no open lesions present bilateral, + minimal hyperkeratotic tissue present at right 2nd toe distal tuft. No signs of infection bilateral.  Musculoskeletal: Hammertoes and Residual bunion deformities noted bilateral; had bunionectomy years ago. Muscular strength within normal limits without pain on range of motion. No pain with calf compression bilateral.  Assessment and Plan:  Problem List Items Addressed This Visit    None    Visit Diagnoses    Pain due to onychomycosis of toenail    -  Primary     -Examined patient.  -Re-Discussed treatment options for painful mycotic nails -Nails x10 were trimmed using sterile nail nipper without incident -Continue with vicks to nails and recommended vinegar soaks; instructions provided -Patient to return in 3-4 months for nail trim or sooner if symptoms worsen.  Sheri Brown, DPM

## 2020-11-17 ENCOUNTER — Telehealth: Payer: Self-pay | Admitting: *Deleted

## 2020-11-17 MED ORDER — MEMANTINE HCL 10 MG PO TABS
10.0000 mg | ORAL_TABLET | Freq: Two times a day (BID) | ORAL | 11 refills | Status: DC
Start: 2020-11-17 — End: 2022-11-09

## 2020-11-17 NOTE — Telephone Encounter (Signed)
I sent in maintenance dosing of Namenda.

## 2020-11-23 ENCOUNTER — Other Ambulatory Visit: Payer: Self-pay | Admitting: Physician Assistant

## 2020-11-23 ENCOUNTER — Ambulatory Visit
Admission: RE | Admit: 2020-11-23 | Discharge: 2020-11-23 | Disposition: A | Discharge status: Home / Self Care | Payer: Medicare Other | Source: Ambulatory Visit | Attending: Physician Assistant | Admitting: Physician Assistant

## 2020-11-23 ENCOUNTER — Other Ambulatory Visit: Payer: Self-pay

## 2020-11-23 DIAGNOSIS — R0609 Other forms of dyspnea: Secondary | ICD-10-CM

## 2020-11-23 DIAGNOSIS — R0600 Dyspnea, unspecified: Secondary | ICD-10-CM

## 2020-11-24 ENCOUNTER — Telehealth: Payer: Self-pay

## 2020-11-24 NOTE — Telephone Encounter (Signed)
NOTES ON FILE FROM [facility] [phone], SENT REFERRAL TO SCHEDULING °

## 2020-12-09 NOTE — Progress Notes (Signed)
Cardiology Office Note:    Date:  12/17/2020   ID:  Sheri Brown, DOB 12-27-1935, MRN [id number]  PCP:  Halina Maidens., MD (Inactive)  [facility] Cardiologist:  No primary care provider on file.  [facility] Electrophysiologist:  None   Referring MD: [name], *    History of Present Illness:    Sheri Brown is a 85 y.o. female with a hx of HTN, HLD, GERD, and benign essential tremor who was referred by Dr. Mayford Knife for further evaluation of dyspnea on exertion.   Patient saw her primary care on 11/23/20 where she was complaining of worsening DOE x 6 weeks. Specifically, she felt winded after doing any activity. Also with some chest discomfort with activity and after eating. She was started on a PPI and referred to Cardiology for further management.  Last saw Dr. Jacinto Halim on 08/07/16 where she underwent RHC which showed normal right heart catheterizaton with preserved cardiac output and cardiac index.   Today, the patient states that all tasks make her feel very short winded and like she cannot get enough air in. Has some chest discomfort but this is after eating and not with exertion. Has been ongoing for about for about 3 months but has gotten progressively worse. No COVID. No orthopnea, PND, palpitations, exertional chest pain. Trace LE edema which is unchanged.  Past Medical History:  Diagnosis Date  . Anemia   . Arthritis    OA hands  . Benign essential tremor 08/24/2017  . Complication of anesthesia    "doesnt need alot of anesthesia"  . GERD (gastroesophageal reflux disease)   . History of hyperlipidemia   . Hypertension 03/06/2016  . Infected sebaceous cyst   . Memory difficulties 08/24/2017  . Orthostatic hypotension   . PONV (postoperative nausea and vomiting)   . Right inguinal hernia 09/07/2017  . Sickle cell trait (HCC)   . Thrombocytopathia (HCC)   . Vitamin D deficiency   . Weight loss     Past Surgical History:  Procedure Laterality Date   . ABDOMINAL HYSTERECTOMY    . BLADDER SUSPENSION    . BUNIONECTOMY    . CARDIAC CATHETERIZATION N/A 08/08/2016   Procedure: Right Heart Cath;  Surgeon: Yates Decamp, MD;  Location: [facility] INVASIVE CV LAB;  Service: Cardiovascular;  Laterality: N/A;  . EYE SURGERY     cataract  . INGUINAL HERNIA REPAIR Right 09/07/2017   Procedure: OPEN REPAIR RIGHT INGUINAL HERNIA WITH MESH;  Surgeon: Claud Kelp, MD;  Location: [facility];  Service: General;  Laterality: Right;  . INSERTION OF MESH Right 09/07/2017   Procedure: INSERTION OF MESH;  Surgeon: Claud Kelp, MD;  Location: [facility];  Service: General;  Laterality: Right;  . JOINT REPLACEMENT Right     Current Medications: Current Meds  Medication Sig  . diclofenac (VOLTAREN) 75 MG EC tablet Take by mouth. Pt taking 1 tab twice a day  . gabapentin (NEURONTIN) 100 MG capsule Take 1 capsule (100 mg total) by mouth 2 (two) times daily.  Marland Kitchen ibuprofen (ADVIL) 200 MG tablet Take by mouth.  . memantine (NAMENDA) 10 MG tablet Take 1 tablet (10 mg total) by mouth 2 (two) times daily.  . metoprolol succinate (TOPROL-XL) 25 MG 24 hr tablet Take 1 tablet (25 mg total) by mouth every evening.     Allergies:   Aricept [donepezil hcl], Diazepam, and Gluten meal   Social History   Socioeconomic History  . Marital status: Widowed  Spouse name: Not on file  . Number of children: 3  . Years of education: 16+  . Highest education level: Not on file  Occupational History  . Not on file  Tobacco Use  . Smoking status: Never Smoker  . Smokeless tobacco: Never Used  Vaping Use  . Vaping Use: Never used  Substance and Sexual Activity  . Alcohol use: No  . Drug use: No  . Sexual activity: Not on file  Other Topics Concern  . Not on file  Social History Narrative   Lives alone   Right handed    Caffeine use: none   Social Determinants of Health   Financial Resource Strain: Not on file  Food Insecurity: Not on  file  Transportation Needs: Not on file  Physical Activity: Not on file  Stress: Not on file  Social Connections: Not on file     Family History: The patient's family history includes Cancer in her father; Hypertension in her mother.  ROS:   Please see the history of present illness.    Review of Systems  Constitutional: Negative for chills and fever.  HENT: Negative for congestion.   Eyes: Negative for blurred vision.  Respiratory: Positive for shortness of breath.   Cardiovascular: Negative for chest pain, palpitations, orthopnea, claudication, leg swelling and PND.  Gastrointestinal: Positive for heartburn. Negative for melena, nausea and vomiting.  Genitourinary: Negative for hematuria.  Musculoskeletal: Positive for joint pain.  Neurological: Negative for loss of consciousness.  Endo/Heme/Allergies: Negative for polydipsia.  Psychiatric/Behavioral: Negative for substance abuse.    EKGs/Labs/Other Studies Reviewed:    The following studies were reviewed today: RHC 09-01-2016: Procedural data:  RA pressure 4/2  Mean 1 mm mercury.   RV pressure 26/2 and Right ventricular EDP 4 mm Hg. PA pressure 27/10 with a mean of 16  mm mercury. PA saturation 63 %.  Pulmonary capillary wedge 12/8 with a mean of 8 mm Hg. Aortic saturation 100 %.  Cardiac output was 4.11 with cardiac index of 2.49  by Fick and 4.31 and 2.61 by thermodilution technique.  Impression:   Normal right heart catheterizaton with preserved cardiac output and cardiac index.    EKG:  EKG 11/23/20: NSR with HR 68, q waves V1-V2.  Recent Labs: No results found for requested labs within last 8760 hours.  Recent Lipid Panel No results found for: CHOL, TRIG, HDL, CHOLHDL, VLDL, LDLCALC, LDLDIRECT    Physical Exam:    VS:  BP 130/70   Pulse 66   Ht 5' 5.75" (1.67 m)   Wt 129 lb (58.5 kg)   SpO2 96%   BMI 20.98 kg/m     Wt Readings from Last 3 Encounters:  12/17/20 129 lb (58.5 kg)  07/15/20 134 lb (60.8  kg)  10/15/19 132 lb (59.9 kg)     GEN: Elderly female, well developed in no acute distress HEENT: Normal NECK: No JVD; No carotid bruits CARDIAC: RRR, 1/6 systolic murmur. No rubs, gallops RESPIRATORY:  Clear to auscultation without rales, wheezing or rhonchi  ABDOMEN: Soft, non-tender, non-distended MUSCULOSKELETAL:  No edema; No deformity  SKIN: Warm and dry NEUROLOGIC:  Alert and oriented x 3 PSYCHIATRIC:  Normal affect   ASSESSMENT:    1. SOB (shortness of breath)   2. Primary hypertension    PLAN:    In order of problems listed above:  #Dyspnea on Exertion: Patient with progressive dyspnea on exertion over the past couple months. Notes that she gets winded with minimal activity  and feels like she needs to stop and catch her breath. No associated lightheadedness, chest discomfort, palpitations, nausea. No known history of CAD. Given exertional and progressive nature of symptoms, concerned about anginal equivalent. Will proceed with stress testing.  -Check lexiscan  -Obtain TTE  #HTN: Well controlled. -Continue metoprolol succinate 25mg  XL daily  Shared Decision Making/Informed Consent The risks [chest pain, shortness of breath, cardiac arrhythmias, dizziness, blood pressure fluctuations, myocardial infarction, stroke/transient ischemic attack, nausea, vomiting, allergic reaction, radiation exposure, metallic taste sensation and life-threatening complications (estimated to be 1 in 10,000)], benefits (risk stratification, diagnosing coronary artery disease, treatment guidance) and alternatives of a nuclear stress test were discussed in detail with Ms. Malay and she agrees to proceed.   Medication Adjustments/Labs and Tests Ordered: Current medicines are reviewed at length with the patient today.  Concerns regarding medicines are outlined above.  Orders Placed This Encounter  Procedures  . Myocardial Perfusion Imaging  . ECHOCARDIOGRAM COMPLETE   No orders of the defined  types were placed in this encounter.   Patient Instructions  Medication Instructions:  Your physician recommends that you continue on your current medications as directed. Please refer to the Current Medication list given to you today.  *If you need a refill on your cardiac medications before your next appointment, please call your pharmacy*   Lab Work: None ordered   If you have labs (blood work) drawn today and your tests are completely normal, you will receive your results only by: Marland Kitchen MyChart Message (if you have MyChart) OR . A paper copy in the mail If you have any lab test that is abnormal or we need to change your treatment, we will call you to review the results.   Testing/Procedures: Your physician has requested that you have an echocardiogram. Echocardiography is a painless test that uses sound waves to create images of your heart. It provides your doctor with information about the size and shape of your heart and how well your heart's chambers and valves are working. This procedure takes approximately one hour. There are no restrictions for this procedure.  Your physician has requested that you have a lexiscan myoview. For further information please visit [email or web address] Please follow instruction sheet, as given.   Follow-Up: At [facility], you and your health needs are our priority.  As part of our continuing mission to provide you with exceptional heart care, we have created designated Provider Care Teams.  These Care Teams include your primary Cardiologist (physician) and Advanced Practice Providers (APPs -  Physician Assistants and Nurse Practitioners) who all work together to provide you with the care you need, when you need it.  We recommend signing up for the patient portal called "MyChart".  Sign up information is provided on this After Visit Summary.  MyChart is used to connect with patients for Virtual Visits (Telemedicine).  Patients are able to view lab/test  results, encounter notes, upcoming appointments, etc.  Non-urgent messages can be sent to your provider as well.   To learn more about what you can do with MyChart, go to [email or web address].    Your next appointment:   2 month(s)  The format for your next appointment:   In Person  Provider:   You may see Dr. Laurance Flatten or one of the following Advanced Practice Providers on your designated Care Team:    Tereso Newcomer, PA-C  Chelsea Aus, [place]    Other Instructions None      Signed, Meriam Sprague, MD  12/17/2020  2:53 PM    [facility]

## 2020-12-17 ENCOUNTER — Other Ambulatory Visit: Payer: Self-pay

## 2020-12-17 ENCOUNTER — Encounter: Payer: Self-pay | Admitting: Cardiology

## 2020-12-17 ENCOUNTER — Ambulatory Visit (INDEPENDENT_AMBULATORY_CARE_PROVIDER_SITE_OTHER): Payer: Medicare Other | Admitting: Cardiology

## 2020-12-17 ENCOUNTER — Encounter (INDEPENDENT_AMBULATORY_CARE_PROVIDER_SITE_OTHER): Payer: Self-pay

## 2020-12-17 VITALS — BP 130/70 | HR 66 | Ht 65.75 in | Wt 129.0 lb

## 2020-12-17 DIAGNOSIS — R0602 Shortness of breath: Secondary | ICD-10-CM

## 2020-12-17 DIAGNOSIS — I10 Essential (primary) hypertension: Secondary | ICD-10-CM

## 2020-12-17 NOTE — Patient Instructions (Signed)
Medication Instructions:  Your physician recommends that you continue on your current medications as directed. Please refer to the Current Medication list given to you today.  *If you need a refill on your cardiac medications before your next appointment, please call your pharmacy*   Lab Work: None ordered   If you have labs (blood work) drawn today and your tests are completely normal, you will receive your results only by: Marland Kitchen MyChart Message (if you have MyChart) OR . A paper copy in the mail If you have any lab test that is abnormal or we need to change your treatment, we will call you to review the results.   Testing/Procedures: Your physician has requested that you have an echocardiogram. Echocardiography is a painless test that uses sound waves to create images of your heart. It provides your doctor with information about the size and shape of your heart and how well your heart's chambers and valves are working. This procedure takes approximately one hour. There are no restrictions for this procedure.  Your physician has requested that you have a lexiscan myoview. For further information please visit [email or web address] Please follow instruction sheet, as given.   Follow-Up: At [facility], you and your health needs are our priority.  As part of our continuing mission to provide you with exceptional heart care, we have created designated Provider Care Teams.  These Care Teams include your primary Cardiologist (physician) and Advanced Practice Providers (APPs -  Physician Assistants and Nurse Practitioners) who all work together to provide you with the care you need, when you need it.  We recommend signing up for the patient portal called "MyChart".  Sign up information is provided on this After Visit Summary.  MyChart is used to connect with patients for Virtual Visits (Telemedicine).  Patients are able to view lab/test results, encounter notes, upcoming appointments, etc.   Non-urgent messages can be sent to your provider as well.   To learn more about what you can do with MyChart, go to [email or web address].    Your next appointment:   2 month(s)  The format for your next appointment:   In Person  Provider:   You may see Dr. Laurance Flatten or one of the following Advanced Practice Providers on your designated Care Team:    Tereso Newcomer, PA-C  Chelsea Aus, [place]    Other Instructions None

## 2021-01-10 ENCOUNTER — Telehealth (HOSPITAL_COMMUNITY): Payer: Self-pay | Admitting: *Deleted

## 2021-01-10 NOTE — Telephone Encounter (Signed)
Left message on voicemail in reference to upcoming appointment scheduled for 01/14/21. Phone number given for a call back so details instructions can be given.  Ricky Ala

## 2021-01-11 ENCOUNTER — Telehealth (HOSPITAL_COMMUNITY): Payer: Self-pay

## 2021-01-11 NOTE — Telephone Encounter (Signed)
Patient given detailed instructions per Myocardial Perfusion Study Information Sheet for the test on 2/25 at 1030. Patient notified to arrive 15 minutes early and that it is imperative to arrive on time for appointment to keep from having the test rescheduled.  If you need to cancel or reschedule your appointment, please call the office within 24 hours of your appointment. . Patient verbalized understanding. TMY

## 2021-01-13 ENCOUNTER — Ambulatory Visit: Payer: Medicare Other | Admitting: Sports Medicine

## 2021-01-13 ENCOUNTER — Other Ambulatory Visit (HOSPITAL_COMMUNITY): Payer: Medicare Other

## 2021-01-13 ENCOUNTER — Encounter (HOSPITAL_COMMUNITY): Payer: Medicare Other

## 2021-01-14 ENCOUNTER — Ambulatory Visit (HOSPITAL_COMMUNITY): Payer: Medicare Other | Attending: Internal Medicine

## 2021-01-14 ENCOUNTER — Ambulatory Visit (HOSPITAL_BASED_OUTPATIENT_CLINIC_OR_DEPARTMENT_OTHER): Payer: Medicare Other

## 2021-01-14 ENCOUNTER — Other Ambulatory Visit: Payer: Self-pay

## 2021-01-14 DIAGNOSIS — R0602 Shortness of breath: Secondary | ICD-10-CM | POA: Insufficient documentation

## 2021-01-14 LAB — ECHOCARDIOGRAM COMPLETE
Area-P 1/2: 3.31 cm2
S' Lateral: 2.5 cm

## 2021-01-14 LAB — MYOCARDIAL PERFUSION IMAGING
LV dias vol: 48 mL (ref 46–106)
LV sys vol: 18 mL
Peak HR: 88 {beats}/min
Rest HR: 59 {beats}/min
SDS: 3
SRS: 0
SSS: 3
TID: 0.92

## 2021-01-14 MED ORDER — AMINOPHYLLINE 25 MG/ML IV SOLN
75.0000 mg | Freq: Once | INTRAVENOUS | Status: AC
  Administered 2021-01-14: 75 mg via INTRAVENOUS

## 2021-01-14 MED ORDER — TECHNETIUM TC 99M TETROFOSMIN IV KIT
10.5000 | PACK | Freq: Once | INTRAVENOUS | Status: AC | PRN
  Administered 2021-01-14: 10.5 via INTRAVENOUS
  Filled 2021-01-14: qty 11

## 2021-01-14 MED ORDER — TECHNETIUM TC 99M TETROFOSMIN IV KIT
30.7000 | PACK | Freq: Once | INTRAVENOUS | Status: AC | PRN
  Administered 2021-01-14: 30.7 via INTRAVENOUS
  Filled 2021-01-14: qty 31

## 2021-01-14 MED ORDER — REGADENOSON 0.4 MG/5ML IV SOLN
0.4000 mg | Freq: Once | INTRAVENOUS | Status: AC
  Administered 2021-01-14: 0.4 mg via INTRAVENOUS

## 2021-01-17 ENCOUNTER — Ambulatory Visit: Payer: Medicare Other | Admitting: Neurology

## 2021-01-17 ENCOUNTER — Telehealth: Payer: Self-pay

## 2021-01-17 NOTE — Telephone Encounter (Signed)
-----   Message from Meriam Sprague, MD sent at 01/17/2021  8:14 AM EST ----- Stress test was normal with no evidence of decreased blood flow to the heart. The heart pumping function is also normal. This is great news and means that her shortness of breath on exertion is not likely due to blockages in her heart arteries. We will follow-up her echo results.

## 2021-01-17 NOTE — Telephone Encounter (Signed)
Lauris Poag, patients daughter returned call. She has been informed of the patients stress rest results per patient request. (DPR on file) All questions were answered.

## 2021-01-17 NOTE — Telephone Encounter (Signed)
The patient has been notified of the result and verbalized understanding.  All questions (if any) were answered.  Patient also requested that I call her daughter Ocie Cornfield to inform her of stress test results, DPR on file. Left a message for Lauris Poag, patients daughter to call back to discuss patients results if needed.  Leanord Hawking, RN 01/17/2021 9:49 AM

## 2021-02-03 ENCOUNTER — Other Ambulatory Visit: Payer: Self-pay

## 2021-02-03 ENCOUNTER — Encounter: Payer: Self-pay | Admitting: Sports Medicine

## 2021-02-03 ENCOUNTER — Ambulatory Visit (INDEPENDENT_AMBULATORY_CARE_PROVIDER_SITE_OTHER): Payer: Medicare Other | Admitting: Sports Medicine

## 2021-02-03 DIAGNOSIS — B351 Tinea unguium: Secondary | ICD-10-CM | POA: Diagnosis not present

## 2021-02-03 DIAGNOSIS — L309 Dermatitis, unspecified: Secondary | ICD-10-CM

## 2021-02-03 DIAGNOSIS — M79676 Pain in unspecified toe(s): Secondary | ICD-10-CM | POA: Diagnosis not present

## 2021-02-03 DIAGNOSIS — M79675 Pain in left toe(s): Secondary | ICD-10-CM

## 2021-02-03 DIAGNOSIS — M79674 Pain in right toe(s): Secondary | ICD-10-CM

## 2021-02-03 MED ORDER — HYDROCORTISONE 1 % EX OINT: 1.0000 "application " | TOPICAL_OINTMENT | Freq: Two times a day (BID) | CUTANEOUS | 0 refills | Status: DC

## 2021-02-03 NOTE — Progress Notes (Signed)
Patient ID: Sheri Brown, female   DOB: 1935-12-25, 85 y.o.   MRN: [id number]  Subjective: Sheri Brown is a 85 y.o. female patient seen today in office with complaint of painful thickened and elongated toenails; unable to trim.  Patient denies any changes with medicines or except having a itchy rash to her left leg.  No other pedal complaints noted.  Patient Active Problem List   Diagnosis Date Noted  . Orthostatic hypotension 03/18/2019  . Vitamin D deficiency, unspecified 03/14/2019  . Sickle cell trait (HCC) 03/10/2019  . Thrombocytopenia (HCC) 03/10/2019  . Weight loss 03/10/2019  . Infected sebaceous cyst of skin 02/19/2019  . Right inguinal hernia 09/07/2017  . Benign essential tremor 08/24/2017  . Memory difficulties 08/24/2017  . Shortness of breath 08/07/2016  . Anemia 03/06/2016  . Dizziness 03/06/2016  . History of hyperlipidemia 03/06/2016  . Hypertension 03/06/2016    Current Outpatient Medications on File Prior to Visit  Medication Sig Dispense Refill  . diclofenac (VOLTAREN) 75 MG EC tablet Take by mouth. Pt taking 1 tab twice a day    . gabapentin (NEURONTIN) 100 MG capsule Take 1 capsule (100 mg total) by mouth 2 (two) times daily. 60 capsule 5  . ibuprofen (ADVIL) 200 MG tablet Take by mouth.    . meloxicam (MOBIC) 15 MG tablet Take 1 tablet by mouth daily.    . memantine (NAMENDA) 10 MG tablet Take 1 tablet (10 mg total) by mouth 2 (two) times daily. 60 tablet 11  . metoprolol succinate (TOPROL-XL) 25 MG 24 hr tablet Take 1 tablet (25 mg total) by mouth every evening. 90 tablet 3   No current facility-administered medications on file prior to visit.    Allergies  Allergen Reactions  . Aricept [Donepezil Hcl]     Pt stated it caused dizziness, nausea  . Diazepam Other (See Comments)    Mental disturbance.  . Gluten Meal Nausea Only and Other (See Comments)    Severe gastric upset-flatulence. Severe gastric upset-flatulence.    Objective: Physical  Exam  General: Well developed, nourished, no acute distress, awake, alert and oriented x 3  Vascular: Dorsalis pedis artery 2/4 bilateral, Posterior tibial artery 1/4 bilateral, skin temperature warm to warm proximal to distal bilateral lower extremities, no varicosities, pedal hair present bilateral.  Neurological: Gross sensation present via light touch bilateral.   Dermatological: Skin is warm, dry, and supple bilateral, Nails 1-10 are tender, extremely long, thick, and discolored with moderate to severe subungal debris and discoloration, no webspace macerations present bilateral, no open lesions present bilateral, + minimal hyperkeratotic tissue present at right 2nd toe distal tuft.  Raised itchy rash pruritic and red in nature to the left lower leg.  No signs of infection bilateral.  Musculoskeletal: Hammertoes and Residual bunion deformities noted bilateral; had bunionectomy years ago. Muscular strength within normal limits without pain on range of motion. No pain with calf compression bilateral.  Assessment and Plan:  Problem List Items Addressed This Visit   None   Visit Diagnoses    Pain due to onychomycosis of toenail    -  Primary   Toe pain, bilateral       Dermatitis         -Examined patient.  -Re-Discussed treatment options for painful mycotic nails -Nails x10 were trimmed using sterile nail nipper without incident -Rx Cortisone for dermatitis  -Patient to return in 3-4 months for nail trim or sooner if symptoms worsen.  [name], DPM

## 2021-02-09 NOTE — Progress Notes (Unsigned)
Cardiology Office Note:    Date:  02/11/2021   ID:  Sheri Brown, DOB 09-12-1936, MRN [id number]  PCP:  Bernita Buffy   [facility]  Cardiologist:  No primary care provider on file.  Advanced Practice Provider:  No care team member to display Electrophysiologist:  None    Referring MD: No ref. provider found    History of Present Illness:    Sheri Brown is a 85 y.o. female with a hx of HTN, HLD, GERD, and benign essential tremor who returns to clinic for follow-up.  Was previously followed by Dr. Jacinto Halim. Was last seen there on 08/07/16 where she underwent RHC which showed normal right heart catheterizaton with preserved cardiac output and cardiac index.   She was seen by me on 12/17/20 where she felt SOB with activity for the past 3 months. We obtained a myoview which was negative for ischemia. TTE with normal LVEF, G2DD, mild MR, normal RAP.  Today, the patient states that she feels okay. Continues to have dyspnea on exertion that limits her activity. Blood pressure ranging 110-160s at home. Usually higher before she eats and drops after a meal. She denies any chest pain, LE edema, lightheadedness, orthopnea, PND or palpitations.   Past Medical History:  Diagnosis Date  . Anemia   . Arthritis    OA hands  . Benign essential tremor 08/24/2017  . Complication of anesthesia    "doesnt need alot of anesthesia"  . GERD (gastroesophageal reflux disease)   . History of hyperlipidemia   . Hypertension 03/06/2016  . Infected sebaceous cyst   . Memory difficulties 08/24/2017  . Orthostatic hypotension   . PONV (postoperative nausea and vomiting)   . Right inguinal hernia 09/07/2017  . Sickle cell trait (HCC)   . Thrombocytopathia (HCC)   . Vitamin D deficiency   . Weight loss     Past Surgical History:  Procedure Laterality Date  . ABDOMINAL HYSTERECTOMY    . BLADDER SUSPENSION    . BUNIONECTOMY    . CARDIAC CATHETERIZATION N/A 08/08/2016    Procedure: Right Heart Cath;  Surgeon: Yates Decamp, MD;  Location: [facility] INVASIVE CV LAB;  Service: Cardiovascular;  Laterality: N/A;  . EYE SURGERY     cataract  . INGUINAL HERNIA REPAIR Right 09/07/2017   Procedure: OPEN REPAIR RIGHT INGUINAL HERNIA WITH MESH;  Surgeon: Claud Kelp, MD;  Location: [facility];  Service: General;  Laterality: Right;  . INSERTION OF MESH Right 09/07/2017   Procedure: INSERTION OF MESH;  Surgeon: Claud Kelp, MD;  Location: [facility];  Service: General;  Laterality: Right;  . JOINT REPLACEMENT Right     Current Medications: Current Meds  Medication Sig  . diclofenac (VOLTAREN) 75 MG EC tablet Take by mouth. Pt taking 1 tab twice a day  . gabapentin (NEURONTIN) 100 MG capsule Take 1 capsule (100 mg total) by mouth 2 (two) times daily.  . hydrocortisone 1 % ointment Apply 1 application topically 2 (two) times daily.  Marland Kitchen ibuprofen (ADVIL) 200 MG tablet Take by mouth.  . memantine (NAMENDA) 10 MG tablet Take 1 tablet (10 mg total) by mouth 2 (two) times daily.  . metoprolol succinate (TOPROL-XL) 50 MG 24 hr tablet Take 1 tablet (50 mg total) by mouth daily. Take with or immediately following a meal.  . valsartan (DIOVAN) 40 MG tablet Take 1 tablet (40 mg total) by mouth daily.  . [DISCONTINUED] metoprolol succinate (TOPROL-XL) 25 MG 24  hr tablet Take 1 tablet (25 mg total) by mouth every evening.  . [DISCONTINUED] metoprolol tartrate (LOPRESSOR) 50 MG tablet Take 1 tablet (50 mg total) by mouth 2 (two) times daily.     Allergies:   Aricept [donepezil hcl], Diazepam, and Gluten meal   Social History   Socioeconomic History  . Marital status: Widowed    Spouse name: Not on file  . Number of children: 3  . Years of education: 16+  . Highest education level: Not on file  Occupational History  . Not on file  Tobacco Use  . Smoking status: Never Smoker  . Smokeless tobacco: Never Used  Vaping Use  . Vaping Use: Never  used  Substance and Sexual Activity  . Alcohol use: No  . Drug use: No  . Sexual activity: Not on file  Other Topics Concern  . Not on file  Social History Narrative   Lives alone   Right handed    Caffeine use: none   Social Determinants of Health   Financial Resource Strain: Not on file  Food Insecurity: Not on file  Transportation Needs: Not on file  Physical Activity: Not on file  Stress: Not on file  Social Connections: Not on file     Family History: The patient's family history includes Cancer in her father; Hypertension in her mother.  ROS:   Please see the history of present illness.    Review of Systems  Constitutional: Positive for malaise/fatigue. Negative for chills and fever.  HENT: Negative for hearing loss and sore throat.   Eyes: Negative for blurred vision and redness.  Respiratory: Positive for shortness of breath.   Cardiovascular: Negative for chest pain, palpitations, orthopnea, claudication, leg swelling and PND.  Gastrointestinal: Negative for melena, nausea and vomiting.  Genitourinary: Negative for dysuria and flank pain.  Musculoskeletal: Negative for myalgias.  Neurological: Negative for dizziness and loss of consciousness.  Psychiatric/Behavioral: Negative for substance abuse.    EKGs/Labs/Other Studies Reviewed:    The following studies were reviewed today: RHC 09/06/2016: Procedural data:  RA pressure 4/2 Mean 1 mm mercury.  RV pressure 26/2 and Right ventricular EDP 4 mm Hg. PA pressure 27/10 with a mean of 16 mm mercury. PA saturation 63 %.  Pulmonary capillary wedge 12/8 with a mean of 8 mm Hg. Aortic saturation 100 %.  Cardiac output was 4.11 with cardiac index of 2.49 by Fick and 4.31 and 2.61 by thermodilution technique.  Impression: Normal right heart catheterizaton with preserved cardiac output and cardiac index.   Myoview 01/14/21:   The left ventricular ejection fraction is normal (55-65%).  Nuclear stress EF:  62%.  There was no ST segment deviation noted during stress.  The study is normal.   Normal resting and stress perfusion. No ischemia or infarction EF 62%   TTE 01/14/21: 1. Left ventricular ejection fraction, by estimation, is 60 to 65%. The  left ventricle has normal function. The left ventricle has no regional  wall motion abnormalities. There is mild asymmetric left ventricular  hypertrophy of the posterior-lateral  segment. Left ventricular diastolic parameters are consistent with Grade  II diastolic dysfunction (pseudonormalization).  2. Right ventricular systolic function is normal. The right ventricular  size is normal. There is normal pulmonary artery systolic pressure. The  estimated right ventricular systolic pressure is 32.4 mmHg.  3. Left atrial size was mildly dilated.  4. The mitral valve is normal in structure. Mild mitral valve  regurgitation. No evidence of mitral stenosis.  5. The aortic valve is grossly normal. There is mild calcification of the  aortic valve. Aortic valve regurgitation is trivial. No aortic stenosis is  present.  6. The inferior vena cava is normal in size with greater than 50%  respiratory variability, suggesting right atrial pressure of 3 mmHg.    Recent Labs: No results found for requested labs within last 8760 hours.  Recent Lipid Panel No results found for: CHOL, TRIG, HDL, CHOLHDL, VLDL, LDLCALC, LDLDIRECT    Physical Exam:    VS:  BP (!) 146/70 (BP Location: Left Arm, Patient Position: Sitting, Cuff Size: Normal)   Pulse 72   Ht 5\' 5"  (1.651 m)   Wt 129 lb (58.5 kg)   SpO2 92%   BMI 21.47 kg/m     Wt Readings from Last 3 Encounters:  02/11/21 129 lb (58.5 kg)  01/14/21 129 lb (58.5 kg)  12/17/20 129 lb (58.5 kg)     GEN:  Well nourished, well developed in no acute distress HEENT: Normal NECK: No JVD; No carotid bruits CARDIAC: RRR, 1/6 systolic murmur. No rubs, gallops RESPIRATORY:  Clear to auscultation  without rales, wheezing or rhonchi  ABDOMEN: Soft, non-tender, non-distended MUSCULOSKELETAL:  No edema; No deformity  SKIN: Warm and dry NEUROLOGIC:  Alert and oriented x 3 PSYCHIATRIC:  Normal affect   ASSESSMENT:    1. SOB (shortness of breath)   2. Medication management   3. Primary hypertension    PLAN:    In order of problems listed above:  #Dyspnea on Exertion: Patient with progressive dyspnea on exertion over the past couple months. Notes that she gets winded with minimal activity and feels like she needs to stop and catch her breath. No associated lightheadedness, chest discomfort, palpitations, nausea. Myoview negative for ischemia. TTE with normal EF, G2DD, no significant valve disease. Given persistence of symptoms, will refer to pulmonology for further work-up. -Refer to pulmonary -Myoview negative -TTE with normal LVEF, G2DD, no signficant valve disease -Continue blood pressure control  #HTN: Elevated at 147/60 today.  -Increase metop to 50mg  XL daily -Start valsartan 40mg  daily -BMET next week   Medication Adjustments/Labs and Tests Ordered: Current medicines are reviewed at length with the patient today.  Concerns regarding medicines are outlined above.  Orders Placed This Encounter  Procedures  . Basic metabolic panel  . Ambulatory referral to Pulmonology   Meds ordered this encounter  Medications  . DISCONTD: metoprolol tartrate (LOPRESSOR) 50 MG tablet    Sig: Take 1 tablet (50 mg total) by mouth 2 (two) times daily.    Dispense:  90 tablet    Refill:  3  . valsartan (DIOVAN) 40 MG tablet    Sig: Take 1 tablet (40 mg total) by mouth daily.    Dispense:  90 tablet    Refill:  3  . metoprolol succinate (TOPROL-XL) 50 MG 24 hr tablet    Sig: Take 1 tablet (50 mg total) by mouth daily. Take with or immediately following a meal.    Dispense:  90 tablet    Refill:  3    Patient Instructions  Medication Instructions:  Increase metoprolol to 50 mg a  day  Start Valsartan 40 mg in the morning   *If you need a refill on your cardiac medications before your next appointment, please call your pharmacy*   Lab Work: BMET one week   If you have labs (blood work) drawn today and your tests are completely normal, you will receive your results  only by: Marland Kitchen MyChart Message (if you have MyChart) OR . A paper copy in the mail If you have any lab test that is abnormal or we need to change your treatment, we will call you to review the results.   Testing/Procedures: none   Follow-Up: At [facility], you and your health needs are our priority.  As part of our continuing mission to provide you with exceptional heart care, we have created designated Provider Care Teams.  These Care Teams include your primary Cardiologist (physician) and Advanced Practice Providers (APPs -  Physician Assistants and Nurse Practitioners) who all work together to provide you with the care you need, when you need it.  We recommend signing up for the patient portal called "MyChart".  Sign up information is provided on this After Visit Summary.  MyChart is used to connect with patients for Virtual Visits (Telemedicine).  Patients are able to view lab/test results, encounter notes, upcoming appointments, etc.  Non-urgent messages can be sent to your provider as well.   To learn more about what you can do with MyChart, go to [email or web address].    Your next appointment:   6 month(s)  The format for your next appointment:   In Person  Provider:   Laurance Flatten, MD   Other Instructions AMB Referral to Pulmonary for Dyspnea     Signed, Meriam Sprague, MD  02/11/2021 11:08 AM    [facility]

## 2021-02-11 ENCOUNTER — Ambulatory Visit (INDEPENDENT_AMBULATORY_CARE_PROVIDER_SITE_OTHER): Payer: Medicare Other | Admitting: Cardiology

## 2021-02-11 ENCOUNTER — Encounter: Payer: Self-pay | Admitting: Cardiology

## 2021-02-11 ENCOUNTER — Other Ambulatory Visit: Payer: Self-pay

## 2021-02-11 VITALS — BP 146/70 | HR 72 | Ht 65.0 in | Wt 129.0 lb

## 2021-02-11 DIAGNOSIS — Z79899 Other long term (current) drug therapy: Secondary | ICD-10-CM | POA: Diagnosis not present

## 2021-02-11 DIAGNOSIS — I10 Essential (primary) hypertension: Secondary | ICD-10-CM

## 2021-02-11 DIAGNOSIS — R0602 Shortness of breath: Secondary | ICD-10-CM | POA: Diagnosis not present

## 2021-02-11 MED ORDER — METOPROLOL TARTRATE 50 MG PO TABS: 50.0000 mg | ORAL_TABLET | Freq: Two times a day (BID) | ORAL | 3 refills | Status: DC

## 2021-02-11 MED ORDER — VALSARTAN 40 MG PO TABS: 40.0000 mg | ORAL_TABLET | Freq: Every day | ORAL | 3 refills | Status: DC

## 2021-02-11 MED ORDER — METOPROLOL SUCCINATE ER 50 MG PO TB24: 50.0000 mg | ORAL_TABLET | Freq: Every day | ORAL | 3 refills | Status: DC

## 2021-02-11 NOTE — Patient Instructions (Addendum)
Medication Instructions:  Increase metoprolol to 50 mg a day  Start Valsartan 40 mg in the morning   *If you need a refill on your cardiac medications before your next appointment, please call your pharmacy*   Lab Work: BMET one week   If you have labs (blood work) drawn today and your tests are completely normal, you will receive your results only by: Marland Kitchen MyChart Message (if you have MyChart) OR . A paper copy in the mail If you have any lab test that is abnormal or we need to change your treatment, we will call you to review the results.   Testing/Procedures: none   Follow-Up: At [facility], you and your health needs are our priority.  As part of our continuing mission to provide you with exceptional heart care, we have created designated Provider Care Teams.  These Care Teams include your primary Cardiologist (physician) and Advanced Practice Providers (APPs -  Physician Assistants and Nurse Practitioners) who all work together to provide you with the care you need, when you need it.  We recommend signing up for the patient portal called "MyChart".  Sign up information is provided on this After Visit Summary.  MyChart is used to connect with patients for Virtual Visits (Telemedicine).  Patients are able to view lab/test results, encounter notes, upcoming appointments, etc.  Non-urgent messages can be sent to your provider as well.   To learn more about what you can do with MyChart, go to [email or web address].    Your next appointment:   6 month(s)  The format for your next appointment:   In Person  Provider:   Laurance Flatten, MD   Other Instructions AMB Referral to Pulmonary for Dyspnea

## 2021-03-30 ENCOUNTER — Other Ambulatory Visit: Payer: Self-pay

## 2021-03-30 ENCOUNTER — Ambulatory Visit (INDEPENDENT_AMBULATORY_CARE_PROVIDER_SITE_OTHER): Payer: Medicare Other | Admitting: Pulmonary Disease

## 2021-03-30 ENCOUNTER — Encounter: Payer: Self-pay | Admitting: Pulmonary Disease

## 2021-03-30 VITALS — BP 118/76 | HR 68 | Temp 98.2°F | Ht 65.0 in | Wt 127.6 lb

## 2021-03-30 DIAGNOSIS — R0609 Other forms of dyspnea: Secondary | ICD-10-CM

## 2021-03-30 DIAGNOSIS — R0600 Dyspnea, unspecified: Secondary | ICD-10-CM | POA: Diagnosis not present

## 2021-03-30 DIAGNOSIS — I5189 Other ill-defined heart diseases: Secondary | ICD-10-CM | POA: Diagnosis not present

## 2021-03-30 NOTE — Patient Instructions (Signed)
Nice to meet you  I ordered PFTs or breathing tests to better evaluate your shortness of breath.  I think there a a few reasons for the symptoms:  1) The spine is curved which may not allow the rib cage to fully expand, thus restricting air into the lungs  2) The left diaphragm is elevated or high, this indicated it likely is not functioning properly so it is restricting air into the lungs.  3) The heart is a little stiff and with the normal physiology of exercise and increasing blood pressure can lead to fluid going backwards instead of to the tissues where blood and oxygen is needed.  Return to clinic in 3 months with Dr. Judeth Horn for follow up.

## 2021-03-30 NOTE — Progress Notes (Signed)
@Patient  ID: Sheri Brown, female    DOB: 13-Apr-1936, 85 y.o.   MRN: [id number]  Chief Complaint  Patient presents with  . Consult    Referred by cardiology for SOB for the past 3-4 months. Has also noticed some left flank pain that started about 3 weeks ago. Denies any coughing.     Referring provider: Meriam Sprague, MD  HPI:   85 year old whom we are seeing in consultation for evaluation of dyspnea on exertion.  Most recent cardiology notes x2 reviewed.  PCP note reviewed.  Patient notes several month history of dyspnea on exertion.  This is new, moderate in severity.  Gets better with rest.  No other alleviating or exacerbating factors she can identify.  No seasonal changes.  No environmental changes that would account for symptoms.  No timing during the day when things are better or worse.  No position where things are better or worse.  She denies any cough.  No history of asthma.  She does have mild seasonal allergies that may be a bit worse this spring.  She does not take medicines regularly for it.  She is not very concerned about the symptoms.  She notes in childhood having some respiratory illnesses, does not think it was quite severe enough to be called asthma.  She is had no issues as an adult.  For dyspnea, she has been followed by cardiology.  She had a normal stress test.  She had echocardiogram that did reveal diastolic dysfunction grade 2, left atrial dilation, mild mitral valve regurgitation.  Rest of rest of results essentially normal from echocardiogram point of view.  She had a chest x-ray 11/2020 in the setting of onset of shortness of breath which on my interpretation reveals clear lungs, significant scoliosis, left hemidiaphragm elevation, suspect significant stool burden on portions of abdomen that can be viewed.  This is compared to most recent prior chest x-ray in 2017 that revealed similar findings on my interpretation.  PMH: Hypertension Surgical history:  Hysterectomy, inguinal hernia repair, joint replacement hip Family history: Hypertension in mother, bladder cancer in father Social history: Never smoker, retired from Metallurgist and school nurse in [place] / Pulmonary Flowsheets:   ACT:  No flowsheet data found.  MMRC: mMRC Dyspnea Scale mMRC Score  03/30/2021 3    Epworth:  No flowsheet data found.  Tests:   FENO:  No results found for: NITRICOXIDE  PFT: No flowsheet data found.  WALK:  No flowsheet data found.  Imaging: Personally reviewed and as per EMR discussion this note  Lab Results: Reviewed.  Care everywhere, mild anemia seems stable over serial values CBC No results found for: WBC, RBC, HGB, HCT, PLT, MCV, MCH, MCHC, RDW, LYMPHSABS, MONOABS, EOSABS, BASOSABS  BMET No results found for: NA, K, CL, CO2, GLUCOSE, BUN, CREATININE, CALCIUM, GFRNONAA, GFRAA  BNP No results found for: BNP  ProBNP No results found for: PROBNP  Specialty Problems      Pulmonary Problems   Shortness of breath      Allergies  Allergen Reactions  . Aricept [Donepezil Hcl]     Pt stated it caused dizziness, nausea  . Diazepam Other (See Comments)    Mental disturbance.  . Gluten Meal Nausea Only and Other (See Comments)    Severe gastric upset-flatulence. Severe gastric upset-flatulence.    Immunization History  Administered Date(s) Administered  . Influenza, High Dose Seasonal PF 11/28/2017, 10/22/2018, 09/15/2020  . PFIZER(Purple Top)SARS-COV-2 Vaccination 12/31/2019, 01/21/2020, 09/15/2020  Past Medical History:  Diagnosis Date  . Anemia   . Arthritis    OA hands  . Benign essential tremor 08/24/2017  . Complication of anesthesia    "doesnt need alot of anesthesia"  . GERD (gastroesophageal reflux disease)   . History of hyperlipidemia   . Hypertension 03/06/2016  . Infected sebaceous cyst   . Memory difficulties 08/24/2017  . Orthostatic hypotension   . PONV (postoperative  nausea and vomiting)   . Right inguinal hernia 09/07/2017  . Sickle cell trait (HCC)   . Thrombocytopathia (HCC)   . Vitamin D deficiency   . Weight loss     Tobacco History: Social History   Tobacco Use  Smoking Status Never Smoker  Smokeless Tobacco Never Used   Counseling given: Not Answered   Continue to not smoke  Outpatient Encounter Medications as of 03/30/2021  Medication Sig  . diclofenac (VOLTAREN) 75 MG EC tablet Take by mouth. Pt taking 1 tab twice a day  . gabapentin (NEURONTIN) 100 MG capsule Take 1 capsule (100 mg total) by mouth 2 (two) times daily.  . hydrocortisone 1 % ointment Apply 1 application topically 2 (two) times daily.  Marland Kitchen ibuprofen (ADVIL) 200 MG tablet Take by mouth.  . memantine (NAMENDA) 10 MG tablet Take 1 tablet (10 mg total) by mouth 2 (two) times daily.  . metoprolol succinate (TOPROL-XL) 50 MG 24 hr tablet Take 1 tablet (50 mg total) by mouth daily. Take with or immediately following a meal.  . valsartan (DIOVAN) 40 MG tablet Take 1 tablet (40 mg total) by mouth daily.   No facility-administered encounter medications on file as of 03/30/2021.     Review of Systems  Review of Systems  No chest pain with exertion.  No orthopnea or PND.  No lower extremity swelling.  Comprehensive review of systems otherwise negative.  Physical Exam  BP 118/76   Pulse 68   Temp 98.2 F (36.8 C) (Temporal)   Ht 5\' 5"  (1.651 m)   Wt 127 lb 9.6 oz (57.9 kg)   SpO2 100% Comment: on RA  BMI 21.23 kg/m   Wt Readings from Last 5 Encounters:  03/30/21 127 lb 9.6 oz (57.9 kg)  02/11/21 129 lb (58.5 kg)  01/14/21 129 lb (58.5 kg)  12/17/20 129 lb (58.5 kg)  07/15/20 134 lb (60.8 kg)    BMI Readings from Last 5 Encounters:  03/30/21 21.23 kg/m  02/11/21 21.47 kg/m  01/14/21 21.47 kg/m  12/17/20 20.98 kg/m  07/15/20 22.30 kg/m     Physical Exam General: Sitting in chair, no acute distress Eyes: EOMI, no icterus Neck: Supple no  JVP Cardiovascular: Regular rhythm, no murmur Pulmonary: Normal work of breathing, clear to auscultation bilaterally Abdomen: Nondistended, bowel sounds present MSK: No synovitis, joint effusion Neuro: Walks with assistance of cane, no weakness Psych: Normal mood, full affect   Assessment & Plan:   DOE: New symptom over the last few months.  Suspect multifactorial. 1) Restrictive physiology with scoliosis appears worsened 2017 to 2022. 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  3) she has grade 2 diastolic dysfunction, left atrial dilation, mitral valve regurg- quite possible normal physiology of exercise and increasing blood pressure leads to worsening diastology and mitral valve regurgitation reducing cardiac output contributing to symptoms as well as possible transient pulmonary edema contributing to symptoms.  Will obtain  PFTs in the coming weeks to further evaluate possible additional pulmonary contributors.  Defer inhaler therapy for now as not convinced will be helpful based on discussion above.  If PFTs reveal otherwise we will counsel patient and recommend inhalers as indicated.  Lastly, she does have mild anemia on serial labs via Care Everywhere.  Stable overall.  Consider recheck if PFTs normal.  Left-sided flank discomfort: Intermittent, painful.  Nothing on chest x-ray to suggest parenchymal issue.  Suspect MSK in the setting of scoliosis versus constipation and referred pain given significant stool burden noted on visible portion of abdomen on serial chest x-rays.  Advised ongoing discussion with PCP.  Consider trial of MiraLAX although she struggles with fluctuating constipation and diarrhea so can be difficult to moderate.  Return in about 3 months (around 06/30/2021).   Karren Burly, MD 03/30/2021

## 2021-04-19 ENCOUNTER — Other Ambulatory Visit (HOSPITAL_COMMUNITY)
Admission: RE | Admit: 2021-04-19 | Discharge: 2021-04-19 | Disposition: A | Discharge status: Home / Self Care | Payer: Medicare Other | Source: Ambulatory Visit | Attending: Pulmonary Disease | Admitting: Pulmonary Disease

## 2021-04-19 DIAGNOSIS — Z20822 Contact with and (suspected) exposure to covid-19: Secondary | ICD-10-CM | POA: Insufficient documentation

## 2021-04-19 DIAGNOSIS — Z01812 Encounter for preprocedural laboratory examination: Secondary | ICD-10-CM | POA: Insufficient documentation

## 2021-04-19 LAB — SARS CORONAVIRUS 2 (TAT 6-24 HRS): SARS Coronavirus 2: NEGATIVE

## 2021-04-22 ENCOUNTER — Ambulatory Visit (INDEPENDENT_AMBULATORY_CARE_PROVIDER_SITE_OTHER): Payer: Medicare Other | Admitting: Pulmonary Disease

## 2021-04-22 ENCOUNTER — Other Ambulatory Visit: Payer: Self-pay

## 2021-04-22 DIAGNOSIS — R0600 Dyspnea, unspecified: Secondary | ICD-10-CM | POA: Diagnosis not present

## 2021-04-22 DIAGNOSIS — R0609 Other forms of dyspnea: Secondary | ICD-10-CM

## 2021-04-22 LAB — PULMONARY FUNCTION TEST
DL/VA % pred: 77 %
DL/VA: 3.15 ml/min/mmHg/L
DLCO cor % pred: 51 %
DLCO cor: 9.31 ml/min/mmHg
DLCO unc % pred: 44 %
DLCO unc: 8.05 ml/min/mmHg
FEF 25-75 Post: 1.17 L/sec
FEF 25-75 Pre: 0.74 L/sec
FEF2575-%Change-Post: 58 %
FEF2575-%Pred-Post: 100 %
FEF2575-%Pred-Pre: 63 %
FEV1-%Change-Post: 11 %
FEV1-%Pred-Post: 101 %
FEV1-%Pred-Pre: 90 %
FEV1-Post: 1.43 L
FEV1-Pre: 1.28 L
FEV1FVC-%Change-Post: 12 %
FEV1FVC-%Pred-Pre: 86 %
FEV6-%Change-Post: 0 %
FEV6-%Pred-Post: 112 %
FEV6-%Pred-Pre: 112 %
FEV6-Post: 1.97 L
FEV6-Pre: 1.98 L
FEV6FVC-%Change-Post: 0 %
FEV6FVC-%Pred-Post: 105 %
FEV6FVC-%Pred-Pre: 104 %
FVC-%Change-Post: 0 %
FVC-%Pred-Post: 106 %
FVC-%Pred-Pre: 108 %
FVC-Post: 1.97 L
FVC-Pre: 1.99 L
Post FEV1/FVC ratio: 73 %
Post FEV6/FVC ratio: 100 %
Pre FEV1/FVC ratio: 65 %
Pre FEV6/FVC Ratio: 100 %
RV % pred: 73 %
RV: 1.75 L
TLC % pred: 76 %
TLC: 3.75 L

## 2021-04-22 NOTE — Progress Notes (Signed)
PFT done today. 

## 2021-05-12 ENCOUNTER — Ambulatory Visit: Payer: Medicare Other | Admitting: Sports Medicine

## 2021-08-11 ENCOUNTER — Other Ambulatory Visit: Payer: Self-pay | Admitting: Pulmonary Disease

## 2021-08-31 ENCOUNTER — Other Ambulatory Visit: Payer: Self-pay

## 2021-08-31 ENCOUNTER — Ambulatory Visit: Payer: Medicare Other | Attending: Physician Assistant

## 2021-08-31 DIAGNOSIS — M4120 Other idiopathic scoliosis, site unspecified: Secondary | ICD-10-CM | POA: Diagnosis present

## 2021-08-31 DIAGNOSIS — R2689 Other abnormalities of gait and mobility: Secondary | ICD-10-CM | POA: Insufficient documentation

## 2021-08-31 DIAGNOSIS — M6281 Muscle weakness (generalized): Secondary | ICD-10-CM | POA: Insufficient documentation

## 2021-08-31 DIAGNOSIS — R262 Difficulty in walking, not elsewhere classified: Secondary | ICD-10-CM | POA: Insufficient documentation

## 2021-08-31 DIAGNOSIS — R2681 Unsteadiness on feet: Secondary | ICD-10-CM | POA: Diagnosis present

## 2021-08-31 DIAGNOSIS — G8929 Other chronic pain: Secondary | ICD-10-CM | POA: Diagnosis present

## 2021-08-31 DIAGNOSIS — M25611 Stiffness of right shoulder, not elsewhere classified: Secondary | ICD-10-CM | POA: Insufficient documentation

## 2021-08-31 DIAGNOSIS — M25511 Pain in right shoulder: Secondary | ICD-10-CM | POA: Diagnosis present

## 2021-08-31 DIAGNOSIS — M5442 Lumbago with sciatica, left side: Secondary | ICD-10-CM | POA: Diagnosis not present

## 2021-09-01 NOTE — Therapy (Signed)
Benign essential tremor 08/24/2017   Memory difficulties 08/24/2017   Shortness of breath 08/07/2016   Anemia 03/06/2016   Dizziness 03/06/2016   History of hyperlipidemia 03/06/2016   Hypertension 03/06/2016    Joellyn Rued MS, PT 09/01/21 5:02 PM   [facility] Outpatient Rehabilitation [facility] [address] [place], [place], [address] Phone: [phone]   Fax:  [phone]  Name: Sheri Brown MRN: [id number] Date of Birth: January 31, 1936  Benign essential tremor 08/24/2017   Memory difficulties 08/24/2017   Shortness of breath 08/07/2016   Anemia 03/06/2016   Dizziness 03/06/2016   History of hyperlipidemia 03/06/2016   Hypertension 03/06/2016    Joellyn Rued MS, PT 09/01/21 5:02 PM   [facility] [address] [place], [place], [address] Phone: [phone]   Fax:  [phone]  Name: Sheri Brown MRN: [id number] Date of Birth: January 31, 1936  [facility] Outpatient Rehabilitation [facility] [address] [place], [place], [address] Phone: [phone]   Fax:  [phone]  Physical Therapy Evaluation  Patient Details  Name: Sheri Brown MRN: [id number] Date of Birth: [birth year] Referring Provider (PT): [name], [place]   Encounter Date: 08/31/2021   PT End of Session - 09/01/21 0648     Visit Number 1    Number of Visits 17    Date for PT Re-Evaluation 11/05/21    Authorization Type BCBS MEDICARE    Progress Note Due on Visit 10    PT Start Time 1150    PT Stop Time 1240    PT Time Calculation (min) 50 min    Equipment Utilized During Treatment Other (comment)   SPC   Activity Tolerance Patient tolerated treatment well    Behavior During Therapy [facility] for tasks assessed/performed             Past Medical History:  Diagnosis Date   Anemia    Arthritis    OA hands   Benign essential tremor 08/24/2017   Complication of anesthesia    "doesnt need alot of anesthesia"   GERD (gastroesophageal reflux disease)    History of hyperlipidemia    Hypertension 03/06/2016   Infected sebaceous cyst    Memory difficulties 08/24/2017   Orthostatic hypotension    PONV (postoperative nausea and vomiting)    Right inguinal hernia 09/07/2017   Sickle cell trait (HCC)    Thrombocytopathia (HCC)    Vitamin D deficiency    Weight loss     Past Surgical History:  Procedure Laterality Date   ABDOMINAL HYSTERECTOMY     BLADDER SUSPENSION     BUNIONECTOMY     CARDIAC CATHETERIZATION N/A 08/08/2016   Procedure: Right Heart Cath;  Surgeon: Yates Decamp, MD;  Location: [facility] INVASIVE CV LAB;  Service: Cardiovascular;  Laterality: N/A;   EYE SURGERY     cataract   INGUINAL HERNIA REPAIR Right 09/07/2017   Procedure: OPEN REPAIR RIGHT INGUINAL HERNIA WITH MESH;  Surgeon: Claud Kelp, MD;  Location: [facility];  Service: General;  Laterality: Right;   INSERTION OF MESH Right 09/07/2017    Procedure: INSERTION OF MESH;  Surgeon: Claud Kelp, MD;  Location: [facility];  Service: General;  Laterality: Right;   JOINT REPLACEMENT Right     There were no vitals filed for this visit.    Subjective Assessment - 08/31/21 1211     Subjective Pt reports she has been having low back into L LE, but not extending below the knee. pt reports she has had this pain for an extended time time. Pt also reports having significant r shoulder pain.    Patient Stated Goals to have less pain.    Currently in Pain? Yes    Pain Score 8    0-8/10   Pain Location Back    Pain Orientation Right;Left    Pain Descriptors / Indicators Throbbing    Pain Type Chronic pain    Pain Radiating Towards L LE    Pain Onset More than a month ago   March   Pain Frequency Intermittent    Aggravating Factors  Standing and walking    Pain Relieving Factors Sitting in a recliner and lying down in bed                [facility] PT Assessment - 09/01/21 0001       Assessment   Medical Diagnosis  [facility] Outpatient Rehabilitation [facility] [address] [place], [place], [address] Phone: [phone]   Fax:  [phone]  Physical Therapy Evaluation  Patient Details  Name: Sheri Brown MRN: [id number] Date of Birth: [birth year] Referring Provider (PT): [name], [place]   Encounter Date: 08/31/2021   PT End of Session - 09/01/21 0648     Visit Number 1    Number of Visits 17    Date for PT Re-Evaluation 11/05/21    Authorization Type BCBS MEDICARE    Progress Note Due on Visit 10    PT Start Time 1150    PT Stop Time 1240    PT Time Calculation (min) 50 min    Equipment Utilized During Treatment Other (comment)   SPC   Activity Tolerance Patient tolerated treatment well    Behavior During Therapy [facility] for tasks assessed/performed             Past Medical History:  Diagnosis Date   Anemia    Arthritis    OA hands   Benign essential tremor 08/24/2017   Complication of anesthesia    "doesnt need alot of anesthesia"   GERD (gastroesophageal reflux disease)    History of hyperlipidemia    Hypertension 03/06/2016   Infected sebaceous cyst    Memory difficulties 08/24/2017   Orthostatic hypotension    PONV (postoperative nausea and vomiting)    Right inguinal hernia 09/07/2017   Sickle cell trait (HCC)    Thrombocytopathia (HCC)    Vitamin D deficiency    Weight loss     Past Surgical History:  Procedure Laterality Date   ABDOMINAL HYSTERECTOMY     BLADDER SUSPENSION     BUNIONECTOMY     CARDIAC CATHETERIZATION N/A 08/08/2016   Procedure: Right Heart Cath;  Surgeon: Yates Decamp, MD;  Location: [facility] INVASIVE CV LAB;  Service: Cardiovascular;  Laterality: N/A;   EYE SURGERY     cataract   INGUINAL HERNIA REPAIR Right 09/07/2017   Procedure: OPEN REPAIR RIGHT INGUINAL HERNIA WITH MESH;  Surgeon: Claud Kelp, MD;  Location: [facility];  Service: General;  Laterality: Right;   INSERTION OF MESH Right 09/07/2017    Procedure: INSERTION OF MESH;  Surgeon: Claud Kelp, MD;  Location: [facility];  Service: General;  Laterality: Right;   JOINT REPLACEMENT Right     There were no vitals filed for this visit.    Subjective Assessment - 08/31/21 1211     Subjective Pt reports she has been having low back into L LE, but not extending below the knee. pt reports she has had this pain for an extended time time. Pt also reports having significant r shoulder pain.    Patient Stated Goals to have less pain.    Currently in Pain? Yes    Pain Score 8    0-8/10   Pain Location Back    Pain Orientation Right;Left    Pain Descriptors / Indicators Throbbing    Pain Type Chronic pain    Pain Radiating Towards L LE    Pain Onset More than a month ago   March   Pain Frequency Intermittent    Aggravating Factors  Standing and walking    Pain Relieving Factors Sitting in a recliner and lying down in bed                [facility] PT Assessment - 09/01/21 0001       Assessment   Medical Diagnosis

## 2021-09-08 ENCOUNTER — Ambulatory Visit: Payer: Medicare Other | Admitting: Sports Medicine

## 2021-09-08 ENCOUNTER — Ambulatory Visit: Payer: Medicare Other

## 2021-09-14 ENCOUNTER — Other Ambulatory Visit: Payer: Self-pay

## 2021-09-14 ENCOUNTER — Ambulatory Visit: Payer: Medicare Other

## 2021-09-14 DIAGNOSIS — M4120 Other idiopathic scoliosis, site unspecified: Secondary | ICD-10-CM

## 2021-09-14 DIAGNOSIS — M25611 Stiffness of right shoulder, not elsewhere classified: Secondary | ICD-10-CM

## 2021-09-14 DIAGNOSIS — M6281 Muscle weakness (generalized): Secondary | ICD-10-CM

## 2021-09-14 DIAGNOSIS — R262 Difficulty in walking, not elsewhere classified: Secondary | ICD-10-CM

## 2021-09-14 DIAGNOSIS — R2681 Unsteadiness on feet: Secondary | ICD-10-CM

## 2021-09-14 DIAGNOSIS — M5442 Lumbago with sciatica, left side: Secondary | ICD-10-CM | POA: Diagnosis not present

## 2021-09-14 DIAGNOSIS — R2689 Other abnormalities of gait and mobility: Secondary | ICD-10-CM

## 2021-09-14 DIAGNOSIS — G8929 Other chronic pain: Secondary | ICD-10-CM

## 2021-09-14 NOTE — Therapy (Signed)
[facility] Outpatient Rehabilitation [facility] [address] [place], [place], [address] Phone: [phone]   Fax:  [phone]  Physical Therapy Treatment/Cert for R shoulder  Patient Details  Name: Sheri Brown MRN: [id number] Date of Birth: [birth year] Referring Provider (PT): [name], [place]   Encounter Date: 09/14/2021   PT End of Session - 09/14/21 1624     Visit Number 2    Number of Visits 17    Date for PT Re-Evaluation 11/05/21    Authorization Type BCBS MEDICARE    Progress Note Due on Visit 10    PT Start Time 1335    PT Stop Time 1420    PT Time Calculation (min) 45 min    Equipment Utilized During Treatment Other (comment)    Activity Tolerance Patient tolerated treatment well    Behavior During Therapy [facility] for tasks assessed/performed             Past Medical History:  Diagnosis Date   Anemia    Arthritis    OA hands   Benign essential tremor 08/24/2017   Complication of anesthesia    "doesnt need alot of anesthesia"   GERD (gastroesophageal reflux disease)    History of hyperlipidemia    Hypertension 03/06/2016   Infected sebaceous cyst    Memory difficulties 08/24/2017   Orthostatic hypotension    PONV (postoperative nausea and vomiting)    Right inguinal hernia 09/07/2017   Sickle cell trait (HCC)    Thrombocytopathia (HCC)    Vitamin D deficiency    Weight loss     Past Surgical History:  Procedure Laterality Date   ABDOMINAL HYSTERECTOMY     BLADDER SUSPENSION     BUNIONECTOMY     CARDIAC CATHETERIZATION N/A 08/08/2016   Procedure: Right Heart Cath;  Surgeon: Yates Decamp, MD;  Location: [facility] INVASIVE CV LAB;  Service: Cardiovascular;  Laterality: N/A;   EYE SURGERY     cataract   INGUINAL HERNIA REPAIR Right 09/07/2017   Procedure: OPEN REPAIR RIGHT INGUINAL HERNIA WITH MESH;  Surgeon: Claud Kelp, MD;  Location: [facility];  Service: General;  Laterality: Right;   INSERTION OF MESH Right  09/07/2017   Procedure: INSERTION OF MESH;  Surgeon: Claud Kelp, MD;  Location: [facility];  Service: General;  Laterality: Right;   JOINT REPLACEMENT Right     There were no vitals filed for this visit.   Subjective Assessment - 09/14/21 1619     Subjective Pt reports her low back has been feeling better. Pt notes consistency with completion of her HEP    Patient Stated Goals to have less pain.    Currently in Pain? Yes    Pain Score 3     Pain Location Back    Pain Orientation Right;Left    Pain Descriptors / Indicators Aching    Pain Type Chronic pain    Pain Onset More than a month ago    Pain Frequency Intermittent    Aggravating Factors  Standing and walking    Pain Relieving Factors Standing and walking                [facility] PT Assessment - 09/14/21 0001       ROM / Strength   AROM / PROM / Strength Strength;AROM      AROM   Overall AROM Comments R shoulder flexion 75d      Strength   Overall Strength Comments R shoulder strength is generally 3/5  Abnormal gait, Decreased range of motion, Difficulty walking, Decreased activity tolerance, Pain, Postural dysfunction, Decreased strength, Impaired UE functional use  Visit Diagnosis: Chronic right shoulder pain  Decreased ROM of right shoulder  Chronic bilateral low back pain with left-sided sciatica  Other idiopathic scoliosis, site unspecified  Difficulty in walking, not elsewhere classified  Other abnormalities of gait and mobility  Unsteadiness on feet  Other chronic pain  Muscle weakness (generalized)     Problem List Patient Active Problem List   Diagnosis Date Noted   Orthostatic hypotension 03/18/2019   Vitamin D deficiency, unspecified 03/14/2019   Sickle cell trait (HCC) 03/10/2019   Thrombocytopenia (HCC) 03/10/2019   Weight loss 03/10/2019   Infected sebaceous cyst of skin 02/19/2019   Right inguinal hernia 09/07/2017   Benign essential tremor 08/24/2017   Memory difficulties 08/24/2017   Shortness of breath 08/07/2016   Anemia 03/06/2016   Dizziness 03/06/2016   History of hyperlipidemia 03/06/2016   Hypertension 03/06/2016   Joellyn Rued MS, PT 09/14/21 4:53 PM   [facility] Outpatient Rehabilitation [facility] [address] [place], [place], [address] Phone: [phone]   Fax:  [phone]  Name: Sheri Brown MRN: [id number] Date of Birth: February 15, 1936  Abnormal gait, Decreased range of motion, Difficulty walking, Decreased activity tolerance, Pain, Postural dysfunction, Decreased strength, Impaired UE functional use  Visit Diagnosis: Chronic right shoulder pain  Decreased ROM of right shoulder  Chronic bilateral low back pain with left-sided sciatica  Other idiopathic scoliosis, site unspecified  Difficulty in walking, not elsewhere classified  Other abnormalities of gait and mobility  Unsteadiness on feet  Other chronic pain  Muscle weakness (generalized)     Problem List Patient Active Problem List   Diagnosis Date Noted   Orthostatic hypotension 03/18/2019   Vitamin D deficiency, unspecified 03/14/2019   Sickle cell trait (HCC) 03/10/2019   Thrombocytopenia (HCC) 03/10/2019   Weight loss 03/10/2019   Infected sebaceous cyst of skin 02/19/2019   Right inguinal hernia 09/07/2017   Benign essential tremor 08/24/2017   Memory difficulties 08/24/2017   Shortness of breath 08/07/2016   Anemia 03/06/2016   Dizziness 03/06/2016   History of hyperlipidemia 03/06/2016   Hypertension 03/06/2016   Joellyn Rued MS, PT 09/14/21 4:53 PM   [facility] Outpatient Rehabilitation [facility] [address] [place], [place], [address] Phone: [phone]   Fax:  [phone]  Name: Sheri Brown MRN: [id number] Date of Birth: February 15, 1936

## 2021-09-16 ENCOUNTER — Ambulatory Visit: Payer: Medicare Other

## 2021-09-16 ENCOUNTER — Other Ambulatory Visit: Payer: Self-pay

## 2021-09-16 DIAGNOSIS — M25511 Pain in right shoulder: Secondary | ICD-10-CM

## 2021-09-16 DIAGNOSIS — R262 Difficulty in walking, not elsewhere classified: Secondary | ICD-10-CM

## 2021-09-16 DIAGNOSIS — M25611 Stiffness of right shoulder, not elsewhere classified: Secondary | ICD-10-CM

## 2021-09-16 DIAGNOSIS — M5442 Lumbago with sciatica, left side: Secondary | ICD-10-CM

## 2021-09-16 DIAGNOSIS — M6281 Muscle weakness (generalized): Secondary | ICD-10-CM

## 2021-09-16 DIAGNOSIS — R2681 Unsteadiness on feet: Secondary | ICD-10-CM

## 2021-09-16 DIAGNOSIS — G8929 Other chronic pain: Secondary | ICD-10-CM

## 2021-09-16 DIAGNOSIS — M4120 Other idiopathic scoliosis, site unspecified: Secondary | ICD-10-CM

## 2021-09-16 DIAGNOSIS — R2689 Other abnormalities of gait and mobility: Secondary | ICD-10-CM

## 2021-09-17 NOTE — Therapy (Signed)
shoulder pain  Decreased ROM of right shoulder  Other chronic pain  Muscle weakness (generalized)  Other idiopathic scoliosis, site unspecified  Difficulty in walking, not elsewhere classified  Other abnormalities of gait and mobility  Unsteadiness on feet     Problem List Patient Active Problem List   Diagnosis Date Noted   Orthostatic hypotension 03/18/2019   Vitamin D deficiency, unspecified 03/14/2019   Sickle cell trait (HCC) 03/10/2019   Thrombocytopenia (HCC) 03/10/2019   Weight loss 03/10/2019   Infected sebaceous cyst of skin 02/19/2019   Right inguinal hernia 09/07/2017   Benign essential tremor 08/24/2017   Memory difficulties 08/24/2017   Shortness of breath 08/07/2016   Anemia 03/06/2016   Dizziness 03/06/2016   History of hyperlipidemia 03/06/2016   Hypertension 03/06/2016   Joellyn Rued MS, PT 09/17/21 5:25 PM  [facility] Outpatient Rehabilitation [facility] [address] [place], [place], [address] Phone: [phone]   Fax:  [phone]  Name: Sheri Brown MRN: [id number] Date of Birth: 08-31-1936  [facility] Outpatient Rehabilitation [facility] [address] [place], [place], [address] Phone: [phone]   Fax:  [phone]  Physical Therapy Treatment  Patient Details  Name: Sheri Brown MRN: [id number] Date of Birth: 10/14/1936 Referring Provider (PT): [name], [place]   Encounter Date: 09/16/2021   PT End of Session - 09/17/21 1652     Visit Number 3    Number of Visits 17    Date for PT Re-Evaluation 11/05/21    Authorization Type BCBS MEDICARE    Progress Note Due on Visit 10    PT Start Time 1106    PT Stop Time 1150    PT Time Calculation (min) 44 min    Equipment Utilized During Treatment Other (comment)   SPC   Activity Tolerance Patient tolerated treatment well    Behavior During Therapy [facility] for tasks assessed/performed             Past Medical History:  Diagnosis Date   Anemia    Arthritis    OA hands   Benign essential tremor 08/24/2017   Complication of anesthesia    "doesnt need alot of anesthesia"   GERD (gastroesophageal reflux disease)    History of hyperlipidemia    Hypertension 03/06/2016   Infected sebaceous cyst    Memory difficulties 08/24/2017   Orthostatic hypotension    PONV (postoperative nausea and vomiting)    Right inguinal hernia 09/07/2017   Sickle cell trait (HCC)    Thrombocytopathia (HCC)    Vitamin D deficiency    Weight loss     Past Surgical History:  Procedure Laterality Date   ABDOMINAL HYSTERECTOMY     BLADDER SUSPENSION     BUNIONECTOMY     CARDIAC CATHETERIZATION N/A 08/08/2016   Procedure: Right Heart Cath;  Surgeon: Yates Decamp, MD;  Location: [facility] INVASIVE CV LAB;  Service: Cardiovascular;  Laterality: N/A;   EYE SURGERY     cataract   INGUINAL HERNIA REPAIR Right 09/07/2017   Procedure: OPEN REPAIR RIGHT INGUINAL HERNIA WITH MESH;  Surgeon: Claud Kelp, MD;  Location: [facility];  Service: General;  Laterality: Right;   INSERTION OF MESH Right 09/07/2017    Procedure: INSERTION OF MESH;  Surgeon: Claud Kelp, MD;  Location: [facility];  Service: General;  Laterality: Right;   JOINT REPLACEMENT Right     There were no vitals filed for this visit.   Subjective Assessment - 09/16/21 1110     Subjective Pt reports her low back is still feeling better. Her shoulder is hurting from sleeping on her R shoulder last night.    Currently in Pain? Yes    Pain Score 3     Pain Location Back    Pain Orientation Right;Left;Posterior    Pain Descriptors / Indicators Aching    Pain Type Chronic pain    Pain Onset More than a month ago    Pain Frequency Intermittent    Multiple Pain Sites Yes    Pain Score 5    Pain Location Shoulder    Pain Orientation Right    Pain Descriptors / Indicators Aching;Sharp    Pain Type Chronic pain    Pain Onset More than a month ago    Pain Frequency Intermittent    Aggravating Factors  Reaching with her R arm, sleeping on it    Pain Relieving Factors Rest                 [facility] Adult  shoulder pain  Decreased ROM of right shoulder  Other chronic pain  Muscle weakness (generalized)  Other idiopathic scoliosis, site unspecified  Difficulty in walking, not elsewhere classified  Other abnormalities of gait and mobility  Unsteadiness on feet     Problem List Patient Active Problem List   Diagnosis Date Noted   Orthostatic hypotension 03/18/2019   Vitamin D deficiency, unspecified 03/14/2019   Sickle cell trait (HCC) 03/10/2019   Thrombocytopenia (HCC) 03/10/2019   Weight loss 03/10/2019   Infected sebaceous cyst of skin 02/19/2019   Right inguinal hernia 09/07/2017   Benign essential tremor 08/24/2017   Memory difficulties 08/24/2017   Shortness of breath 08/07/2016   Anemia 03/06/2016   Dizziness 03/06/2016   History of hyperlipidemia 03/06/2016   Hypertension 03/06/2016   Joellyn Rued MS, PT 09/17/21 5:25 PM  [facility] Health Outpatient Rehabilitation [facility] [address] [place], [place], [address] Phone: [phone]   Fax:  [phone]  Name: Sheri Brown MRN: [id number] Date of Birth: 08-31-1936

## 2021-09-21 ENCOUNTER — Ambulatory Visit: Payer: Medicare Other | Attending: Physician Assistant

## 2021-09-21 ENCOUNTER — Other Ambulatory Visit: Payer: Self-pay

## 2021-09-21 DIAGNOSIS — R2681 Unsteadiness on feet: Secondary | ICD-10-CM | POA: Diagnosis present

## 2021-09-21 DIAGNOSIS — R262 Difficulty in walking, not elsewhere classified: Secondary | ICD-10-CM | POA: Insufficient documentation

## 2021-09-21 DIAGNOSIS — M25511 Pain in right shoulder: Secondary | ICD-10-CM | POA: Diagnosis present

## 2021-09-21 DIAGNOSIS — R2689 Other abnormalities of gait and mobility: Secondary | ICD-10-CM | POA: Insufficient documentation

## 2021-09-21 DIAGNOSIS — M25611 Stiffness of right shoulder, not elsewhere classified: Secondary | ICD-10-CM | POA: Insufficient documentation

## 2021-09-21 DIAGNOSIS — M4120 Other idiopathic scoliosis, site unspecified: Secondary | ICD-10-CM | POA: Diagnosis present

## 2021-09-21 DIAGNOSIS — G8929 Other chronic pain: Secondary | ICD-10-CM | POA: Insufficient documentation

## 2021-09-21 DIAGNOSIS — M6281 Muscle weakness (generalized): Secondary | ICD-10-CM | POA: Diagnosis present

## 2021-09-21 DIAGNOSIS — M5442 Lumbago with sciatica, left side: Secondary | ICD-10-CM | POA: Diagnosis present

## 2021-09-21 NOTE — Therapy (Signed)
Patient will benefit from skilled therapeutic intervention in order to improve the following deficits and impairments:  Abnormal gait, Decreased range of motion, Difficulty walking, Decreased activity tolerance, Pain, Postural dysfunction, Decreased strength, Impaired UE functional use  Visit Diagnosis: Chronic bilateral low back pain with left-sided sciatica  Chronic right shoulder pain  Decreased ROM of right shoulder  Other chronic pain  Muscle weakness (generalized)  Other idiopathic scoliosis, site unspecified  Difficulty in walking, not elsewhere classified     Problem List Patient Active Problem List   Diagnosis Date Noted   Orthostatic hypotension 03/18/2019   Vitamin D deficiency, unspecified 03/14/2019   Sickle cell trait (HCC) 03/10/2019   Thrombocytopenia (HCC) 03/10/2019   Weight loss 03/10/2019   Infected sebaceous cyst of skin 02/19/2019   Right inguinal hernia 09/07/2017   Benign essential tremor 08/24/2017   Memory difficulties 08/24/2017   Shortness of breath 08/07/2016   Anemia 03/06/2016   Dizziness 03/06/2016   History of hyperlipidemia 03/06/2016   Hypertension 03/06/2016    Joellyn Rued MS, PT 09/21/21 5:13 PM   [facility] Health Outpatient Rehabilitation [facility] [address] [place], [place], [address] Phone: [phone]   Fax:  [phone]  Name: Roseana Rhine MRN: [id number] Date of Birth: Mar 25, 1936  Patient will benefit from skilled therapeutic intervention in order to improve the following deficits and impairments:  Abnormal gait, Decreased range of motion, Difficulty walking, Decreased activity tolerance, Pain, Postural dysfunction, Decreased strength, Impaired UE functional use  Visit Diagnosis: Chronic bilateral low back pain with left-sided sciatica  Chronic right shoulder pain  Decreased ROM of right shoulder  Other chronic pain  Muscle weakness (generalized)  Other idiopathic scoliosis, site unspecified  Difficulty in walking, not elsewhere classified     Problem List Patient Active Problem List   Diagnosis Date Noted   Orthostatic hypotension 03/18/2019   Vitamin D deficiency, unspecified 03/14/2019   Sickle cell trait (HCC) 03/10/2019   Thrombocytopenia (HCC) 03/10/2019   Weight loss 03/10/2019   Infected sebaceous cyst of skin 02/19/2019   Right inguinal hernia 09/07/2017   Benign essential tremor 08/24/2017   Memory difficulties 08/24/2017   Shortness of breath 08/07/2016   Anemia 03/06/2016   Dizziness 03/06/2016   History of hyperlipidemia 03/06/2016   Hypertension 03/06/2016    Joellyn Rued MS, PT 09/21/21 5:13 PM   [facility] Health Outpatient Rehabilitation [facility] [address] [place], [place], [address] Phone: [phone]   Fax:  [phone]  Name: Roseana Rhine MRN: [id number] Date of Birth: Mar 25, 1936  [facility] Outpatient Rehabilitation [facility] [address] [place], [place], [address] Phone: [phone]   Fax:  [phone]  Physical Therapy Treatment  Patient Details  Name: Jesyka Slaght MRN: [id number] Date of Birth: 01-Feb-1936 Referring Provider (PT): [name], [place]   Encounter Date: 09/21/2021   PT End of Session - 09/21/21 1655     Visit Number 4    Number of Visits 17    Date for PT Re-Evaluation 11/05/21    Authorization Type BCBS MEDICARE    Progress Note Due on Visit 10    PT Start Time 1334    PT Stop Time 1419    PT Time Calculation (min) 45 min    Equipment Utilized During Treatment Other (comment)   SPC   Activity Tolerance Patient tolerated treatment well    Behavior During Therapy [facility] for tasks assessed/performed             Past Medical History:  Diagnosis Date   Anemia    Arthritis    OA hands   Benign essential tremor 08/24/2017   Complication of anesthesia    "doesnt need alot of anesthesia"   GERD (gastroesophageal reflux disease)    History of hyperlipidemia    Hypertension 03/06/2016   Infected sebaceous cyst    Memory difficulties 08/24/2017   Orthostatic hypotension    PONV (postoperative nausea and vomiting)    Right inguinal hernia 09/07/2017   Sickle cell trait (HCC)    Thrombocytopathia (HCC)    Vitamin D deficiency    Weight loss     Past Surgical History:  Procedure Laterality Date   ABDOMINAL HYSTERECTOMY     BLADDER SUSPENSION     BUNIONECTOMY     CARDIAC CATHETERIZATION N/A 08/08/2016   Procedure: Right Heart Cath;  Surgeon: Yates Decamp, MD;  Location: [facility] INVASIVE CV LAB;  Service: Cardiovascular;  Laterality: N/A;   EYE SURGERY     cataract   INGUINAL HERNIA REPAIR Right 09/07/2017   Procedure: OPEN REPAIR RIGHT INGUINAL HERNIA WITH MESH;  Surgeon: Claud Kelp, MD;  Location: [facility];  Service: General;  Laterality: Right;   INSERTION OF MESH Right 09/07/2017    Procedure: INSERTION OF MESH;  Surgeon: Claud Kelp, MD;  Location: [facility];  Service: General;  Laterality: Right;   JOINT REPLACEMENT Right     There were no vitals filed for this visit.   Subjective Assessment - 09/21/21 1341     Subjective Pt reports her low back is bothering more today.    Currently in Pain? Yes    Pain Score 8     Pain Location Back    Pain Orientation Right;Left    Pain Descriptors / Indicators Aching    Pain Type Chronic pain    Pain Radiating Towards no L LE pain today    Pain Onset More than a month ago    Pain Frequency Intermittent    Aggravating Factors  Standing and walking    Pain Relieving Factors Sitting in a recliner and lying down in bed    Pain Score 4   with movement, 0/10 at rest   Pain Location Shoulder    Pain Orientation Right    Pain Descriptors / Indicators Aching;Sharp    Pain Type Chronic pain    Pain Onset More than a month ago    Pain Frequency Intermittent    Aggravating Factors  Reaching with her R arm, sleeping on it

## 2021-09-28 ENCOUNTER — Ambulatory Visit: Payer: Medicare Other

## 2021-09-28 ENCOUNTER — Other Ambulatory Visit: Payer: Self-pay

## 2021-09-28 DIAGNOSIS — R2689 Other abnormalities of gait and mobility: Secondary | ICD-10-CM

## 2021-09-28 DIAGNOSIS — M5442 Lumbago with sciatica, left side: Secondary | ICD-10-CM

## 2021-09-28 DIAGNOSIS — G8929 Other chronic pain: Secondary | ICD-10-CM

## 2021-09-28 DIAGNOSIS — M25511 Pain in right shoulder: Secondary | ICD-10-CM

## 2021-09-28 DIAGNOSIS — M6281 Muscle weakness (generalized): Secondary | ICD-10-CM

## 2021-09-28 DIAGNOSIS — R262 Difficulty in walking, not elsewhere classified: Secondary | ICD-10-CM

## 2021-09-28 DIAGNOSIS — M25611 Stiffness of right shoulder, not elsewhere classified: Secondary | ICD-10-CM

## 2021-09-28 DIAGNOSIS — R2681 Unsteadiness on feet: Secondary | ICD-10-CM

## 2021-09-28 DIAGNOSIS — M4120 Other idiopathic scoliosis, site unspecified: Secondary | ICD-10-CM

## 2021-09-28 NOTE — Therapy (Signed)
[facility] Outpatient Rehabilitation [facility] [address] [place], [place], [address] Phone: [phone]   Fax:  [phone]  Physical Therapy Treatment  Patient Details  Name: Sheri Brown MRN: [id number] Date of Birth: [birth year] Referring Provider (PT): [name], [place]   Encounter Date: 09/28/2021   PT End of Session - 09/28/21 1330     Visit Number 5    Number of Visits 17    Date for PT Re-Evaluation 11/05/21    Authorization Type BCBS MEDICARE    Progress Note Due on Visit 10    PT Start Time 1330    PT Stop Time 1414    PT Time Calculation (min) 44 min    Equipment Utilized During Treatment Other (comment)   rollator   Activity Tolerance Patient tolerated treatment well    Behavior During Therapy [facility] for tasks assessed/performed             Past Medical History:  Diagnosis Date   Anemia    Arthritis    OA hands   Benign essential tremor 08/24/2017   Complication of anesthesia    "doesnt need alot of anesthesia"   GERD (gastroesophageal reflux disease)    History of hyperlipidemia    Hypertension 03/06/2016   Infected sebaceous cyst    Memory difficulties 08/24/2017   Orthostatic hypotension    PONV (postoperative nausea and vomiting)    Right inguinal hernia 09/07/2017   Sickle cell trait (HCC)    Thrombocytopathia (HCC)    Vitamin D deficiency    Weight loss     Past Surgical History:  Procedure Laterality Date   ABDOMINAL HYSTERECTOMY     BLADDER SUSPENSION     BUNIONECTOMY     CARDIAC CATHETERIZATION N/A 08/08/2016   Procedure: Right Heart Cath;  Surgeon: Yates Decamp, MD;  Location: [facility] INVASIVE CV LAB;  Service: Cardiovascular;  Laterality: N/A;   EYE SURGERY     cataract   INGUINAL HERNIA REPAIR Right 09/07/2017   Procedure: OPEN REPAIR RIGHT INGUINAL HERNIA WITH MESH;  Surgeon: Claud Kelp, MD;  Location: [facility];  Service: General;  Laterality: Right;   INSERTION OF MESH Right 09/07/2017    Procedure: INSERTION OF MESH;  Surgeon: Claud Kelp, MD;  Location: [facility];  Service: General;  Laterality: Right;   JOINT REPLACEMENT Right     There were no vitals filed for this visit.   Subjective Assessment - 09/28/21 1344     Subjective Pt reports some improvement with her low back re: pain and strength. Pt notes her R shoulder pain and use is limited.    Currently in Pain? Yes    Pain Score 4     Pain Location Back    Pain Orientation Right;Left;Posterior    Pain Descriptors / Indicators Aching    Pain Type Chronic pain    Pain Onset More than a month ago    Pain Frequency Intermittent    Aggravating Factors  Standing and walking    Pain Relieving Factors Sitting in a recliner and lying down in bed    Pain Score 5    Pain Location Shoulder    Pain Orientation Right    Pain Descriptors / Indicators Aching;Sharp    Pain Type Chronic pain    Pain Onset More than a month ago    Pain Frequency Intermittent    Aggravating Factors  Reaching with her R arm, sleeping on it  Problem List Patient Active Problem List   Diagnosis Date Noted   Orthostatic hypotension 03/18/2019   Vitamin D deficiency, unspecified 03/14/2019   Sickle cell trait (HCC) 03/10/2019   Thrombocytopenia (HCC) 03/10/2019   Weight loss 03/10/2019   Infected sebaceous cyst of skin 02/19/2019   Right inguinal hernia 09/07/2017   Benign essential tremor 08/24/2017   Memory difficulties 08/24/2017   Shortness of breath 08/07/2016   Anemia 03/06/2016   Dizziness 03/06/2016   History of hyperlipidemia 03/06/2016   Hypertension 03/06/2016    Joellyn Rued MS, PT 09/28/21 10:05 PM   [facility] [address] [place], [place], [address] Phone: [phone]   Fax:  [phone]  Name: Verle Brillhart MRN: [id number] Date of Birth: 27-Sep-1936  [facility] Outpatient [facility] [address] [place], [place], [address] Phone: [phone]   Fax:  [phone]  Physical Therapy Treatment  Patient Details  Name: Sheri Brown MRN: [id number] Date of Birth: [birth year] Referring Provider (PT): [name], [place]   Encounter Date: 09/28/2021   PT End of Session - 09/28/21 1330     Visit Number 5    Number of Visits 17    Date for PT Re-Evaluation 11/05/21    Authorization Type BCBS MEDICARE    Progress Note Due on Visit 10    PT Start Time 1330    PT Stop Time 1414    PT Time Calculation (min) 44 min    Equipment Utilized During Treatment Other (comment)   rollator   Activity Tolerance Patient tolerated treatment well    Behavior During Therapy [facility] for tasks assessed/performed             Past Medical History:  Diagnosis Date   Anemia    Arthritis    OA hands   Benign essential tremor 08/24/2017   Complication of anesthesia    "doesnt need alot of anesthesia"   GERD (gastroesophageal reflux disease)    History of hyperlipidemia    Hypertension 03/06/2016   Infected sebaceous cyst    Memory difficulties 08/24/2017   Orthostatic hypotension    PONV (postoperative nausea and vomiting)    Right inguinal hernia 09/07/2017   Sickle cell trait (HCC)    Thrombocytopathia (HCC)    Vitamin D deficiency    Weight loss     Past Surgical History:  Procedure Laterality Date   ABDOMINAL HYSTERECTOMY     BLADDER SUSPENSION     BUNIONECTOMY     CARDIAC CATHETERIZATION N/A 08/08/2016   Procedure: Right Heart Cath;  Surgeon: Yates Decamp, MD;  Location: [facility] INVASIVE CV LAB;  Service: Cardiovascular;  Laterality: N/A;   EYE SURGERY     cataract   INGUINAL HERNIA REPAIR Right 09/07/2017   Procedure: OPEN REPAIR RIGHT INGUINAL HERNIA WITH MESH;  Surgeon: Claud Kelp, MD;  Location: [facility];  Service: General;  Laterality: Right;   INSERTION OF MESH Right 09/07/2017    Procedure: INSERTION OF MESH;  Surgeon: Claud Kelp, MD;  Location: [facility];  Service: General;  Laterality: Right;   JOINT REPLACEMENT Right     There were no vitals filed for this visit.   Subjective Assessment - 09/28/21 1344     Subjective Pt reports some improvement with her low back re: pain and strength. Pt notes her R shoulder pain and use is limited.    Currently in Pain? Yes    Pain Score 4     Pain Location Back    Pain Orientation Right;Left;Posterior    Pain Descriptors / Indicators Aching    Pain Type Chronic pain    Pain Onset More than a month ago    Pain Frequency Intermittent    Aggravating Factors  Standing and walking    Pain Relieving Factors Sitting in a recliner and lying down in bed    Pain Score 5    Pain Location Shoulder    Pain Orientation Right    Pain Descriptors / Indicators Aching;Sharp    Pain Type Chronic pain    Pain Onset More than a month ago    Pain Frequency Intermittent    Aggravating Factors  Reaching with her R arm, sleeping on it

## 2021-10-05 ENCOUNTER — Other Ambulatory Visit: Payer: Self-pay

## 2021-10-05 ENCOUNTER — Ambulatory Visit: Payer: Medicare Other

## 2021-10-05 DIAGNOSIS — R2681 Unsteadiness on feet: Secondary | ICD-10-CM

## 2021-10-05 DIAGNOSIS — M6281 Muscle weakness (generalized): Secondary | ICD-10-CM

## 2021-10-05 DIAGNOSIS — M25611 Stiffness of right shoulder, not elsewhere classified: Secondary | ICD-10-CM

## 2021-10-05 DIAGNOSIS — R2689 Other abnormalities of gait and mobility: Secondary | ICD-10-CM

## 2021-10-05 DIAGNOSIS — G8929 Other chronic pain: Secondary | ICD-10-CM

## 2021-10-05 DIAGNOSIS — R262 Difficulty in walking, not elsewhere classified: Secondary | ICD-10-CM

## 2021-10-05 DIAGNOSIS — M5442 Lumbago with sciatica, left side: Secondary | ICD-10-CM | POA: Diagnosis not present

## 2021-10-05 NOTE — Therapy (Signed)
[facility] Outpatient Rehabilitation [facility] [address] [place], [place], [address] Phone: [phone]   Fax:  [phone]  Physical Therapy Treatment  Patient Details  Name: Sheri Brown MRN: [id number] Date of Birth: [birth year] Referring Provider (PT): [name], [place]   Encounter Date: 10/05/2021     Past Medical History:  Diagnosis Date   Anemia    Arthritis    OA hands   Benign essential tremor 08/24/2017   Complication of anesthesia    "doesnt need alot of anesthesia"   GERD (gastroesophageal reflux disease)    History of hyperlipidemia    Hypertension 03/06/2016   Infected sebaceous cyst    Memory difficulties 08/24/2017   Orthostatic hypotension    PONV (postoperative nausea and vomiting)    Right inguinal hernia 09/07/2017   Sickle cell trait (HCC)    Thrombocytopathia (HCC)    Vitamin D deficiency    Weight loss     Past Surgical History:  Procedure Laterality Date   ABDOMINAL HYSTERECTOMY     BLADDER SUSPENSION     BUNIONECTOMY     CARDIAC CATHETERIZATION N/A 08/08/2016   Procedure: Right Heart Cath;  Surgeon: Yates Decamp, MD;  Location: [facility] INVASIVE CV LAB;  Service: Cardiovascular;  Laterality: N/A;   EYE SURGERY     cataract   INGUINAL HERNIA REPAIR Right 09/07/2017   Procedure: OPEN REPAIR RIGHT INGUINAL HERNIA WITH MESH;  Surgeon: Claud Kelp, MD;  Location: [facility];  Service: General;  Laterality: Right;   INSERTION OF MESH Right 09/07/2017   Procedure: INSERTION OF MESH;  Surgeon: Claud Kelp, MD;  Location: [facility];  Service: General;  Laterality: Right;   JOINT REPLACEMENT Right     There were no vitals filed for this visit.   Subjective Assessment - 10/06/21 2052     Subjective Pt reports she is experiencing occasional popping of her R hip c walking or certain leg movements.    Patient Stated Goals to have less pain.    Currently in Pain? Yes    Pain Score 4      Pain Location Back    Pain Orientation Right;Left;Posterior    Pain Descriptors / Indicators Aching    Pain Type Chronic pain    Pain Onset More than a month ago    Pain Frequency Intermittent    Aggravating Factors  Standing and walking    Pain Relieving Factors Sitting in a recliner and lying down in bed    Pain Score 4    Pain Location Shoulder    Pain Descriptors / Indicators Aching    Pain Type Chronic pain    Pain Onset More than a month ago    Pain Frequency Intermittent    Aggravating Factors  Reaching up or out with her R UE    Pain Relieving Factors Rest            [facility] Adult PT Treatment/Exercise:   Therapeutic Exercise: - Nu-step attempted but bothered R hip moving the seat into a proper position and the Nu-step was not attempted - LAQ 2x10, L and R alt., 3# - marching 2x10, L and R alt., 3# - hip abd, RTB, 2x10 - hip add, ball squeeze, x15, 3" - STS, 2x5, with use of hands. Medial knee collapse - forward roll outs for LB and R UE, x10 - Seated shoulder rows 2x10, YTB   Gait Training: - Pt amb 142ft c a SPC at a decreased pace and  step to pattern. Pt completed this distance in 2 mins for a and pt remarked she was ready to sit down.  Manual Therapy: na   Neuromuscular re-ed: na   Therapeutic Activity: na   Not completed this session:  Table top shoulder flexion 10x - Table top shoulder circles 10x each direction - Standing pendulum shoulder flex/ext 10x - Bridging, x10 - HL clamshells, x10, green Tband - HL hip add, x10, ball squeeze - HL marching, x10, each LE - - isometrics shoulder flexion, abd, ext 10 x 5' - Pulley R shoulder flexion, 2 min                              PT Short Term Goals - 10/06/21 2058       PT SHORT TERM GOAL #1   Title pt to be I with inital HEP. 09/21/21: Ind with HEP for low back    Status Achieved    Target Date 10/06/21               PT Long Term Goals - 09/21/21 1700        PT LONG TERM GOAL #7   Title Pt's TUG time will decreased bt 5 sec as a reflection of improved trunk and LB strength.    Baseline 34.9 sec    Status New    Target Date 11/05/21      PT LONG TERM GOAL #8   Title Pt's 1 min walking distance will increase by 20 ft as a reflection of her improved trunk and LB strength    Baseline 44ft    Status New    Target Date 11/05/21                   Plan - 10/06/21 0615     Clinical Impression Statement PT was completed for lumbopelvic/LE and UE strengthening. Pt's continues to experience R shoulder pain c crepitus with a small range of movement with shoulder elevation. Rehab potential is poor for the R shoulder. Will continue low demand exs for ROM and strength to maintain current level of function. Re: pt's low back and functional mobility, pt is making appropriate progress with a 2 min walking distance of 179ft vs. 63 ft with a . Pt reported she has been experiencing intermittent R hip popping c walking and certain hip movements. With the seated marching ex today, popping was palpated . Recommended to pt to request an orthopedic appt if the popping frequency increases or becomes painful. Pt tolerated today's PT session without adverse effects.    Personal Factors and Comorbidities Comorbidity 2;Time since onset of injury/illness/exacerbation;Past/Current Experience    Comorbidities arthritis, s/p R THR 2011, light headedness    Examination-Activity Limitations Locomotion Level;Stand    Stability/Clinical Decision Making Stable/Uncomplicated    Clinical Decision Making Low    Rehab Potential Fair    PT Frequency 2x / week    PT Duration 8 weeks    PT Treatment/Interventions ADLs/Self Care Home Management;Electrical Stimulation;Iontophoresis 4mg /ml Dexamethasone;Moist Heat;Balance training;Therapeutic exercise;Therapeutic activities;Functional mobility training;Stair training;Gait training;Patient/family education;Manual  techniques;Passive range of motion;Taping    PT Next Visit Plan Review and assess response to HEP.    PT Home Exercise Plan [id number]    Consulted and Agree with Plan of Care Patient             Patient will benefit from skilled therapeutic intervention in order to improve the following deficits  and impairments:  Abnormal gait, Decreased range of motion, Difficulty walking, Decreased activity tolerance, Pain, Postural dysfunction, Decreased strength, Impaired UE functional use  Visit Diagnosis: Chronic bilateral low back pain with left-sided sciatica  Chronic right shoulder pain  Decreased ROM of right shoulder  Other chronic pain  Muscle weakness (generalized)  Difficulty in walking, not elsewhere classified  Other abnormalities of gait and mobility  Unsteadiness on feet     Problem List Patient Active Problem List   Diagnosis Date Noted   Orthostatic hypotension 03/18/2019   Vitamin D deficiency, unspecified 03/14/2019   Sickle cell trait (HCC) 03/10/2019   Thrombocytopenia (HCC) 03/10/2019   Weight loss 03/10/2019   Infected sebaceous cyst of skin 02/19/2019   Right inguinal hernia 09/07/2017   Benign essential tremor 08/24/2017   Memory difficulties 08/24/2017   Shortness of breath 08/07/2016   Anemia 03/06/2016   Dizziness 03/06/2016   History of hyperlipidemia 03/06/2016   Hypertension 03/06/2016    Joellyn Rued MS, PT 10/06/21 9:43 PM   [facility] Outpatient Rehabilitation [facility] [address] [place], [place], [address] Phone: [phone]   Fax:  [phone]  Name: Altha Shaub MRN: [id number] Date of Birth: [birth year]

## 2021-10-19 ENCOUNTER — Other Ambulatory Visit: Payer: Self-pay

## 2021-10-19 ENCOUNTER — Ambulatory Visit: Payer: Medicare Other

## 2021-10-19 DIAGNOSIS — M4120 Other idiopathic scoliosis, site unspecified: Secondary | ICD-10-CM

## 2021-10-19 DIAGNOSIS — G8929 Other chronic pain: Secondary | ICD-10-CM

## 2021-10-19 DIAGNOSIS — R2681 Unsteadiness on feet: Secondary | ICD-10-CM

## 2021-10-19 DIAGNOSIS — M6281 Muscle weakness (generalized): Secondary | ICD-10-CM

## 2021-10-19 DIAGNOSIS — R2689 Other abnormalities of gait and mobility: Secondary | ICD-10-CM

## 2021-10-19 DIAGNOSIS — M25611 Stiffness of right shoulder, not elsewhere classified: Secondary | ICD-10-CM

## 2021-10-19 DIAGNOSIS — M5442 Lumbago with sciatica, left side: Secondary | ICD-10-CM | POA: Diagnosis not present

## 2021-10-19 DIAGNOSIS — R262 Difficulty in walking, not elsewhere classified: Secondary | ICD-10-CM

## 2021-10-19 NOTE — Therapy (Signed)
[facility] Outpatient Rehabilitation [facility] [address] [place], [place], [address] Phone: [phone]   Fax:  [phone]  Physical Therapy Treatment  Patient Details  Name: Sheri Brown MRN: [id number] Date of Birth: 04/30/1936 Referring Provider (PT): [name], [place]   Encounter Date: 10/19/2021   PT End of Session - 10/19/21 1334     Visit Number 7    Number of Visits 17    Date for PT Re-Evaluation 11/05/21    Authorization Type BCBS MEDICARE    Progress Note Due on Visit 10    PT Start Time 1333    PT Stop Time 1414    PT Time Calculation (min) 41 min    Activity Tolerance Patient tolerated treatment well    Behavior During Therapy [facility] for tasks assessed/performed             Past Medical History:  Diagnosis Date   Anemia    Arthritis    OA hands   Benign essential tremor 08/24/2017   Complication of anesthesia    "doesnt need alot of anesthesia"   GERD (gastroesophageal reflux disease)    History of hyperlipidemia    Hypertension 03/06/2016   Infected sebaceous cyst    Memory difficulties 08/24/2017   Orthostatic hypotension    PONV (postoperative nausea and vomiting)    Right inguinal hernia 09/07/2017   Sickle cell trait (HCC)    Thrombocytopathia (HCC)    Vitamin D deficiency    Weight loss     Past Surgical History:  Procedure Laterality Date   ABDOMINAL HYSTERECTOMY     BLADDER SUSPENSION     BUNIONECTOMY     CARDIAC CATHETERIZATION N/A 08/08/2016   Procedure: Right Heart Cath;  Surgeon: Yates Decamp, MD;  Location: [facility] INVASIVE CV LAB;  Service: Cardiovascular;  Laterality: N/A;   EYE SURGERY     cataract   INGUINAL HERNIA REPAIR Right 09/07/2017   Procedure: OPEN REPAIR RIGHT INGUINAL HERNIA WITH MESH;  Surgeon: Claud Kelp, MD;  Location: [facility];  Service: General;  Laterality: Right;   INSERTION OF MESH Right 09/07/2017   Procedure: INSERTION OF MESH;  Surgeon: Claud Kelp, MD;   Location: [facility];  Service: General;  Laterality: Right;   JOINT REPLACEMENT Right     There were no vitals filed for this visit.   Subjective Assessment - 10/19/21 1348     Subjective Pt reports she experiences the popping of her R hip primarily doing the HEP supine. Sheri Brown, with completing her HEP in sitting, Pt denied the sensation of her R hip popping completing exs in sitting. Pt notes she is feeling stronger and her balance is better.    Patient Stated Goals to have less pain.    Currently in Pain? Yes    Pain Score 4     Pain Location Back    Pain Orientation Right;Left    Pain Descriptors / Indicators Aching    Pain Type Chronic pain    Pain Onset More than a month ago    Pain Frequency Intermittent    Aggravating Factors  Standing and walking    Pain Relieving Factors Sitting in a recliner and lying down in bed            [facility] Adult PT Treatment/Exercise:   Therapeutic Exercise: - LAQ 2x10, L and R alt., 3# - marching 2x10, L and R alt., RTB - hip abd, RTB, 2x10 - hip add, ball squeeze, x15, 3" -  STS, 2x5, with use of hands. Medial knee collapse - forward roll outs for LB and B UE, x10 - Seated shoulder rows 2x10, YTB  - Balance for standing with feet together, head turns, head nodding 2x each. Increased balance challenge c head movements.   Gait Training: na   Manual Therapy: na   Neuromuscular re-ed: na   Therapeutic Activity: na   Not completed this session:  Table top shoulder flexion 10x - Table top shoulder circles 10x each direction - Standing pendulum shoulder flex/ext 10x - Bridging, x10 - HL clamshells, x10, green Tband - HL hip add, x10, ball squeeze - HL marching, x10, each LE - isometrics shoulder flexion, abd, ext 10 x 5' - Pulley R shoulder flexion, 2 min                            PT Education - 10/19/21 1426     Education Details Pt advised to complete her exs in sitting if she is  tolerating them better than in supine    Person(s) Educated Patient    Methods Explanation    Comprehension Verbalized understanding              PT Short Term Goals - 10/06/21 2058       PT SHORT TERM GOAL #1   Title pt to be I with inital HEP. 09/21/21: Ind with HEP for low back    Status Achieved    Target Date 10/06/21               PT Long Term Goals - 09/21/21 1700       PT LONG TERM GOAL #7   Title Pt's TUG time will decreased bt 5 sec as a reflection of improved trunk and LB strength.    Baseline 34.9 sec    Status New    Target Date 11/05/21      PT LONG TERM GOAL #8   Title Pt's 1 min walking distance will increase by 20 ft as a reflection of her improved trunk and LB strength    Baseline 30ft    Status New    Target Date 11/05/21                   Plan - 10/19/21 1334     Clinical Impression Statement PT participated in PT for lumbopelvic /LE strengthening in sitting. Pt tolerated the session without the popping sensation of the R hip. Per visual inspection, pt's ambulation pace is improved. Pt tolerated the session without adverse effects.    Personal Factors and Comorbidities Comorbidity 2;Time since onset of injury/illness/exacerbation;Past/Current Experience    Comorbidities arthritis, s/p R THR 2011, light headedness    Examination-Activity Limitations Locomotion Level;Stand    Stability/Clinical Decision Making Stable/Uncomplicated    Clinical Decision Making Low    Rehab Potential Fair    PT Frequency 2x / week    PT Duration 8 weeks    PT Treatment/Interventions ADLs/Self Care Home Management;Electrical Stimulation;Iontophoresis 4mg /ml Dexamethasone;Moist Heat;Balance training;Therapeutic exercise;Therapeutic activities;Functional mobility training;Stair training;Gait training;Patient/family education;Manual techniques;Passive range of motion;Taping    PT Next Visit Plan Pro gress strengthening. Assess TUG/1 MWT assessements.    PT  Home Exercise Plan [id number]    Consulted and Agree with Plan of Care Patient             Patient will benefit from skilled therapeutic intervention in order to improve the following deficits and impairments:  Abnormal gait, Decreased range of motion, Difficulty walking, Decreased activity tolerance, Pain, Postural dysfunction, Decreased strength, Impaired UE functional use  Visit Diagnosis: Chronic bilateral low back pain with left-sided sciatica  Chronic right shoulder pain  Decreased ROM of right shoulder  Other chronic pain  Muscle weakness (generalized)  Difficulty in walking, not elsewhere classified  Other abnormalities of gait and mobility  Unsteadiness on feet  Other idiopathic scoliosis, site unspecified     Problem List Patient Active Problem List   Diagnosis Date Noted   Orthostatic hypotension 03/18/2019   Vitamin D deficiency, unspecified 03/14/2019   Sickle cell trait (HCC) 03/10/2019   Thrombocytopenia (HCC) 03/10/2019   Weight loss 03/10/2019   Infected sebaceous cyst of skin 02/19/2019   Right inguinal hernia 09/07/2017   Benign essential tremor 08/24/2017   Memory difficulties 08/24/2017   Shortness of breath 08/07/2016   Anemia 03/06/2016   Dizziness 03/06/2016   History of hyperlipidemia 03/06/2016   Hypertension 03/06/2016   Joellyn Rued MS, PT 10/19/21 5:43 PM   [facility] Outpatient Rehabilitation [facility] [address] [place], [place], [address] Phone: [phone]   Fax:  [phone]  Name: Starlett Hockert MRN: [id number] Date of Birth: [birth year]

## 2021-10-26 ENCOUNTER — Ambulatory Visit: Payer: Medicare Other | Attending: Physician Assistant

## 2021-10-26 ENCOUNTER — Other Ambulatory Visit: Payer: Self-pay

## 2021-10-26 DIAGNOSIS — R2689 Other abnormalities of gait and mobility: Secondary | ICD-10-CM | POA: Diagnosis present

## 2021-10-26 DIAGNOSIS — M25511 Pain in right shoulder: Secondary | ICD-10-CM | POA: Diagnosis present

## 2021-10-26 DIAGNOSIS — M6281 Muscle weakness (generalized): Secondary | ICD-10-CM

## 2021-10-26 DIAGNOSIS — R262 Difficulty in walking, not elsewhere classified: Secondary | ICD-10-CM

## 2021-10-26 DIAGNOSIS — R2681 Unsteadiness on feet: Secondary | ICD-10-CM | POA: Diagnosis present

## 2021-10-26 DIAGNOSIS — G8929 Other chronic pain: Secondary | ICD-10-CM

## 2021-10-26 DIAGNOSIS — M5442 Lumbago with sciatica, left side: Secondary | ICD-10-CM | POA: Insufficient documentation

## 2021-10-26 DIAGNOSIS — M25611 Stiffness of right shoulder, not elsewhere classified: Secondary | ICD-10-CM

## 2021-10-26 DIAGNOSIS — M4120 Other idiopathic scoliosis, site unspecified: Secondary | ICD-10-CM

## 2021-10-26 NOTE — Therapy (Signed)
Patient will benefit from skilled therapeutic intervention in order to improve the following deficits and impairments:  Abnormal gait, Decreased range of motion, Difficulty walking, Decreased activity tolerance, Pain, Postural dysfunction, Decreased strength, Impaired UE functional use  Visit Diagnosis: Chronic bilateral low back pain with left-sided sciatica  Chronic right shoulder pain  Decreased ROM of right shoulder  Other chronic pain  Muscle weakness (generalized)  Difficulty in walking, not elsewhere classified  Other abnormalities of gait and mobility  Unsteadiness on feet  Other idiopathic scoliosis, site unspecified     Problem List Patient Active Problem List   Diagnosis Date Noted   Orthostatic hypotension 03/18/2019   Vitamin D deficiency, unspecified 03/14/2019   Sickle cell trait (HCC) 03/10/2019   Thrombocytopenia (HCC) 03/10/2019   Weight loss 03/10/2019   Infected sebaceous cyst of skin 02/19/2019   Right inguinal hernia 09/07/2017   Benign essential tremor 08/24/2017   Memory difficulties 08/24/2017   Shortness of breath 08/07/2016   Anemia 03/06/2016   Dizziness 03/06/2016   History of hyperlipidemia 03/06/2016   Hypertension 03/06/2016    Joellyn Rued MS, PT 10/26/21 2:37 PM   [facility] [address] [place], [place], [address] Phone: [phone]   Fax:  [phone]  Name: Sheri Brown MRN: [id number] Date of Birth: 1936/05/12  [facility] [address] [place], [place], [address] Phone: [phone]   Fax:  [phone]  Physical Therapy Treatment  Patient Details  Name: Sheri Brown MRN: [id number] Date of Birth: March 13, 1936 Referring Provider (PT): [name], [place]   Encounter Date: 10/26/2021   PT End of Session - 10/26/21 1351     Visit Number 8    Number of Visits 17    Date for PT Re-Evaluation 11/05/21    Authorization Type BCBS MEDICARE    Progress Note Due on Visit 10    PT Start Time 1332    PT Stop Time 1413    PT Time Calculation (min) 41 min    Equipment Utilized During Treatment Other (comment)   SPC   Activity Tolerance Patient tolerated treatment well    Behavior During Therapy [facility] for tasks assessed/performed             Past Medical History:  Diagnosis Date   Anemia    Arthritis    OA hands   Benign essential tremor 08/24/2017   Complication of anesthesia    "doesnt need alot of anesthesia"   GERD (gastroesophageal reflux disease)    History of hyperlipidemia    Hypertension 03/06/2016   Infected sebaceous cyst    Memory difficulties 08/24/2017   Orthostatic hypotension    PONV (postoperative nausea and vomiting)    Right inguinal hernia 09/07/2017   Sickle cell trait (HCC)    Thrombocytopathia (HCC)    Vitamin D deficiency    Weight loss     Past Surgical History:  Procedure Laterality Date   ABDOMINAL HYSTERECTOMY     BLADDER SUSPENSION     BUNIONECTOMY     CARDIAC CATHETERIZATION N/A 08/08/2016   Procedure: Right Heart Cath;  Surgeon: Yates Decamp, MD;  Location: [facility];  Service: Cardiovascular;  Laterality: N/A;   EYE SURGERY     cataract   INGUINAL HERNIA REPAIR Right 09/07/2017   Procedure: OPEN REPAIR RIGHT INGUINAL HERNIA WITH MESH;  Surgeon: Claud Kelp, MD;  Location: [facility];  Service: General;  Laterality: Right;   INSERTION OF MESH Right 09/07/2017    Procedure: INSERTION OF MESH;  Surgeon: Claud Kelp, MD;  Location: [facility];  Service: General;  Laterality: Right;   JOINT REPLACEMENT Right     There were no vitals filed for this visit.   Subjective Assessment - 10/26/21 1354     Subjective During today's session, Pt reported 1-5/10 level. 5/10 with 1 MWT.    Patient Stated Goals to have less pain.    Currently in Pain? Yes    Pain Score 2     Pain Location Back    Pain Orientation Right;Left    Pain Descriptors / Indicators Aching    Pain Type Chronic pain    Pain Onset More than a month ago    Pain Frequency Intermittent    Aggravating Factors  Standing and walking    Pain Relieving Factors Sitting in a recliner and lying down in bed    Multiple Pain Sites No    Pain Score 6    Pain Location Shoulder    Pain Orientation Right    Pain Descriptors / Indicators Aching    Pain Type Chronic pain    Pain Onset More than a month ago    Pain Frequency Intermittent  Patient will benefit from skilled therapeutic intervention in order to improve the following deficits and impairments:  Abnormal gait, Decreased range of motion, Difficulty walking, Decreased activity tolerance, Pain, Postural dysfunction, Decreased strength, Impaired UE functional use  Visit Diagnosis: Chronic bilateral low back pain with left-sided sciatica  Chronic right shoulder pain  Decreased ROM of right shoulder  Other chronic pain  Muscle weakness (generalized)  Difficulty in walking, not elsewhere classified  Other abnormalities of gait and mobility  Unsteadiness on feet  Other idiopathic scoliosis, site unspecified     Problem List Patient Active Problem List   Diagnosis Date Noted   Orthostatic hypotension 03/18/2019   Vitamin D deficiency, unspecified 03/14/2019   Sickle cell trait (HCC) 03/10/2019   Thrombocytopenia (HCC) 03/10/2019   Weight loss 03/10/2019   Infected sebaceous cyst of skin 02/19/2019   Right inguinal hernia 09/07/2017   Benign essential tremor 08/24/2017   Memory difficulties 08/24/2017   Shortness of breath 08/07/2016   Anemia 03/06/2016   Dizziness 03/06/2016   History of hyperlipidemia 03/06/2016   Hypertension 03/06/2016    Joellyn Rued MS, PT 10/26/21 2:37 PM   [facility] [address] [place], [place], [address] Phone: [phone]   Fax:  [phone]  Name: Sheri Brown MRN: [id number] Date of Birth: 1936/05/12

## 2021-10-27 ENCOUNTER — Ambulatory Visit (INDEPENDENT_AMBULATORY_CARE_PROVIDER_SITE_OTHER): Payer: Medicare Other | Admitting: Sports Medicine

## 2021-10-27 ENCOUNTER — Encounter: Payer: Self-pay | Admitting: Sports Medicine

## 2021-10-27 DIAGNOSIS — B351 Tinea unguium: Secondary | ICD-10-CM | POA: Diagnosis not present

## 2021-10-27 DIAGNOSIS — M79676 Pain in unspecified toe(s): Secondary | ICD-10-CM

## 2021-10-27 NOTE — Progress Notes (Signed)
Patient ID: Sheri Brown, female   DOB: 07-28-1936, 85 y.o.   MRN: [id number]  Subjective: Sheri Brown is a 85 y.o. female patient seen today in office with complaint of painful thickened and elongated toenails; unable to trim.  Patient denies any changes with medicines or except having a itchy rash to her left leg that is unchanged from previous visit states that it is still there.  No other pedal complaints noted.  Patient Active Problem List   Diagnosis Date Noted   Orthostatic hypotension 03/18/2019   Vitamin D deficiency, unspecified 03/14/2019   Sickle cell trait (HCC) 03/10/2019   Thrombocytopenia (HCC) 03/10/2019   Weight loss 03/10/2019   Infected sebaceous cyst of skin 02/19/2019   Right inguinal hernia 09/07/2017   Benign essential tremor 08/24/2017   Memory difficulties 08/24/2017   Shortness of breath 08/07/2016   Anemia 03/06/2016   Dizziness 03/06/2016   History of hyperlipidemia 03/06/2016   Hypertension 03/06/2016    Current Outpatient Medications on File Prior to Visit  Medication Sig Dispense Refill   diclofenac (VOLTAREN) 75 MG EC tablet Take by mouth. Pt taking 1 tab twice a day     gabapentin (NEURONTIN) 100 MG capsule Take 1 capsule (100 mg total) by mouth 2 (two) times daily. 60 capsule 5   hydrocortisone 1 % ointment Apply 1 application topically 2 (two) times daily. 30 g 0   ibuprofen (ADVIL) 200 MG tablet Take by mouth.     memantine (NAMENDA) 10 MG tablet Take 1 tablet (10 mg total) by mouth 2 (two) times daily. 60 tablet 11   metoprolol succinate (TOPROL-XL) 50 MG 24 hr tablet Take 1 tablet (50 mg total) by mouth daily. Take with or immediately following a meal. 90 tablet 3   valsartan (DIOVAN) 40 MG tablet Take 1 tablet (40 mg total) by mouth daily. 90 tablet 3   No current facility-administered medications on file prior to visit.    Allergies  Allergen Reactions   Aricept [Donepezil Hcl]     Pt stated it caused dizziness, nausea   Diazepam Other  (See Comments)    Mental disturbance.   Gluten Meal Nausea Only and Other (See Comments)    Severe gastric upset-flatulence. Severe gastric upset-flatulence.    Objective: Physical Exam  General: Well developed, nourished, no acute distress, awake, alert and oriented x 3  Vascular: Dorsalis pedis artery 2/4 bilateral, Posterior tibial artery 1/4 bilateral, skin temperature warm to warm proximal to distal bilateral lower extremities, no varicosities, pedal hair present bilateral.  Neurological: Gross sensation present via light touch bilateral.   Dermatological: Skin is warm, dry, and supple bilateral, Nails 1-10 are tender, extremely long, thick, and discolored with moderate to severe subungal debris and discoloration, no webspace macerations present bilateral, no open lesions present bilateral, + minimal hyperkeratotic tissue present at right 2nd toe distal tuft.  Raised itchy rash pruritic and red in nature to the left lower leg.  No signs of infection bilateral.  Musculoskeletal: Hammertoes and Residual bunion deformities noted bilateral; had bunionectomy years ago. Muscular strength within normal limits without pain on range of motion. No pain with calf compression bilateral.  Assessment and Plan:  Problem List Items Addressed This Visit   None Visit Diagnoses     Pain due to onychomycosis of toenail    -  Primary      -Examined patient.  -Re-Discussed treatment options for painful mycotic nails -Nails x10 were trimmed using sterile nail nipper without incident -Advised patient that  the cortisone did not work for the itchy rash recommend for her to tell her PCP at next visit may need a referral to dermatology -Patient also has questions about extra-depth diabetic shoes advised patient she can purchase from office whenever she is ready at cash price since she is not diabetic -Patient to return in 3-4 months for nail trim or sooner if symptoms worsen.  Sheri Brown, DPM

## 2021-11-01 DIAGNOSIS — N1832 Chronic kidney disease, stage 3b: Secondary | ICD-10-CM | POA: Insufficient documentation

## 2021-11-02 ENCOUNTER — Other Ambulatory Visit: Payer: Self-pay

## 2021-11-02 ENCOUNTER — Ambulatory Visit: Payer: Medicare Other

## 2021-11-02 DIAGNOSIS — R2681 Unsteadiness on feet: Secondary | ICD-10-CM

## 2021-11-02 DIAGNOSIS — R2689 Other abnormalities of gait and mobility: Secondary | ICD-10-CM

## 2021-11-02 DIAGNOSIS — R262 Difficulty in walking, not elsewhere classified: Secondary | ICD-10-CM

## 2021-11-02 DIAGNOSIS — G8929 Other chronic pain: Secondary | ICD-10-CM

## 2021-11-02 DIAGNOSIS — M5442 Lumbago with sciatica, left side: Secondary | ICD-10-CM | POA: Diagnosis not present

## 2021-11-02 DIAGNOSIS — M25611 Stiffness of right shoulder, not elsewhere classified: Secondary | ICD-10-CM

## 2021-11-02 DIAGNOSIS — M4120 Other idiopathic scoliosis, site unspecified: Secondary | ICD-10-CM

## 2021-11-02 NOTE — Therapy (Addendum)
History of hyperlipidemia 03/06/2016   Hypertension 03/06/2016   PHYSICAL THERAPY DISCHARGE SUMMARY  Visits from Start of Care: 9  Current functional level related to goals / functional outcomes: See above   Remaining deficits: See above   Education / Equipment: HEP    Patient agrees to discharge. Patient goals were partially met. Patient is being discharged due to being pleased with the current functional level.    [place], [place], [address] Phone: [phone]   Fax:  [phone]  Name: Erminie Foulks MRN: [id number] Date of Birth: August 18, 1936  History of hyperlipidemia 03/06/2016   Hypertension 03/06/2016   PHYSICAL THERAPY DISCHARGE SUMMARY  Visits from Start of Care: 9  Current functional level related to goals / functional outcomes: See above   Remaining deficits: See above   Education / Equipment: HEP    Patient agrees to discharge. Patient goals were partially met. Patient is being discharged due to being pleased with the current functional level.    [place], [place], [address] Phone: [phone]   Fax:  [phone]  Name: Erminie Foulks MRN: [id number] Date of Birth: August 18, 1936  [place], [place], [address] Phone: [phone]   Fax:  [phone]  Physical Therapy Treatment/Discharge  Patient Details  Name: Lauralyn Shadowens MRN: [id number] Date of Birth: [birth year] Referring Provider (PT): Heywood Bene, [place]   Encounter Date: 11/02/2021   PT End of Session - 11/02/21 1338     Visit Number 9    Number of Visits 17    Date for PT Re-Evaluation 11/05/21    Authorization Type BCBS MEDICARE    PT Start Time 1335    PT Stop Time 1419    PT Time Calculation (min) 44 min    Equipment Utilized During Treatment --   [facility]   Activity Tolerance Patient tolerated treatment well    Behavior During Therapy [facility] for tasks assessed/performed             Past Medical History:  Diagnosis Date   Anemia    Arthritis    OA hands   Benign essential tremor 76/03/4649   Complication of anesthesia    "doesnt need alot of anesthesia"   GERD (gastroesophageal reflux disease)    History of hyperlipidemia    Hypertension 03/06/2016   Infected sebaceous cyst    Memory difficulties 08/24/2017   Orthostatic hypotension    PONV (postoperative nausea and vomiting)    Right inguinal hernia 09/07/2017   Sickle cell trait ([place])    Thrombocytopathia ([place])    Vitamin D deficiency    Weight loss     Past Surgical History:  Procedure Laterality Date   ABDOMINAL HYSTERECTOMY     BLADDER SUSPENSION     BUNIONECTOMY     CARDIAC CATHETERIZATION N/A 08/08/2016   Procedure: Right Heart Cath;  Surgeon: Adrian Prows, MD;  Location: [facility];  Service: Cardiovascular;  Laterality: N/A;   EYE SURGERY     cataract   INGUINAL HERNIA REPAIR Right 09/07/2017   Procedure: OPEN REPAIR RIGHT INGUINAL HERNIA WITH MESH;  Surgeon: Fanny Skates, MD;  Location: [place];  Service: General;  Laterality: Right;   INSERTION OF MESH Right 09/07/2017   Procedure: INSERTION OF MESH;  Surgeon:  Fanny Skates, MD;  Location: [place];  Service: General;  Laterality: Right;   JOINT REPLACEMENT Right     There were no vitals filed for this visit.   Subjective Assessment - 11/02/21 1340     Subjective Pt reports she is in more pain today with her being more active than usual. Overall she reports her LBP and L LE pain are improved.    Patient Stated Goals to have less pain.    Currently in Pain? Yes    Pain Score 7     Pain Location Back    Pain Orientation Right;Left    Pain Descriptors / Indicators Aching    Pain Type Chronic pain    Pain Radiating Towards Pt reports L L LE pain today    Pain Onset More than a month ago    Pain Frequency Intermittent    Aggravating Factors  Standing and walking    Pain Relieving Factors Sitting in a recliner and lying down in bed    Pain Score 6    Pain Location Shoulder    Pain Orientation Right    Pain Descriptors / Indicators Aching    Pain Type Chronic pain    Pain Onset More than a month ago    Pain Frequency Intermittent    Aggravating  [place], [place], [address] Phone: [phone]   Fax:  [phone]  Physical Therapy Treatment/Discharge  Patient Details  Name: Lauralyn Shadowens MRN: [id number] Date of Birth: [birth year] Referring Provider (PT): Heywood Bene, [place]   Encounter Date: 11/02/2021   PT End of Session - 11/02/21 1338     Visit Number 9    Number of Visits 17    Date for PT Re-Evaluation 11/05/21    Authorization Type BCBS MEDICARE    PT Start Time 1335    PT Stop Time 1419    PT Time Calculation (min) 44 min    Equipment Utilized During Treatment --   [facility]   Activity Tolerance Patient tolerated treatment well    Behavior During Therapy [facility] for tasks assessed/performed             Past Medical History:  Diagnosis Date   Anemia    Arthritis    OA hands   Benign essential tremor 76/03/4649   Complication of anesthesia    "doesnt need alot of anesthesia"   GERD (gastroesophageal reflux disease)    History of hyperlipidemia    Hypertension 03/06/2016   Infected sebaceous cyst    Memory difficulties 08/24/2017   Orthostatic hypotension    PONV (postoperative nausea and vomiting)    Right inguinal hernia 09/07/2017   Sickle cell trait ([place])    Thrombocytopathia ([place])    Vitamin D deficiency    Weight loss     Past Surgical History:  Procedure Laterality Date   ABDOMINAL HYSTERECTOMY     BLADDER SUSPENSION     BUNIONECTOMY     CARDIAC CATHETERIZATION N/A 08/08/2016   Procedure: Right Heart Cath;  Surgeon: Adrian Prows, MD;  Location: [facility];  Service: Cardiovascular;  Laterality: N/A;   EYE SURGERY     cataract   INGUINAL HERNIA REPAIR Right 09/07/2017   Procedure: OPEN REPAIR RIGHT INGUINAL HERNIA WITH MESH;  Surgeon: Fanny Skates, MD;  Location: [place];  Service: General;  Laterality: Right;   INSERTION OF MESH Right 09/07/2017   Procedure: INSERTION OF MESH;  Surgeon:  Fanny Skates, MD;  Location: [place];  Service: General;  Laterality: Right;   JOINT REPLACEMENT Right     There were no vitals filed for this visit.   Subjective Assessment - 11/02/21 1340     Subjective Pt reports she is in more pain today with her being more active than usual. Overall she reports her LBP and L LE pain are improved.    Patient Stated Goals to have less pain.    Currently in Pain? Yes    Pain Score 7     Pain Location Back    Pain Orientation Right;Left    Pain Descriptors / Indicators Aching    Pain Type Chronic pain    Pain Radiating Towards Pt reports L L LE pain today    Pain Onset More than a month ago    Pain Frequency Intermittent    Aggravating Factors  Standing and walking    Pain Relieving Factors Sitting in a recliner and lying down in bed    Pain Score 6    Pain Location Shoulder    Pain Orientation Right    Pain Descriptors / Indicators Aching    Pain Type Chronic pain    Pain Onset More than a month ago    Pain Frequency Intermittent    Aggravating

## 2022-01-16 IMAGING — CR DG CHEST 2V
2 series · 2 of 2 positions shown · non-contrast
Comparison: 03/10/2019

CLINICAL DATA: Dyspnea on exertion for 6 weeks, history
hypertension

EXAM:
CHEST - 2 VIEW

[w chest pa]
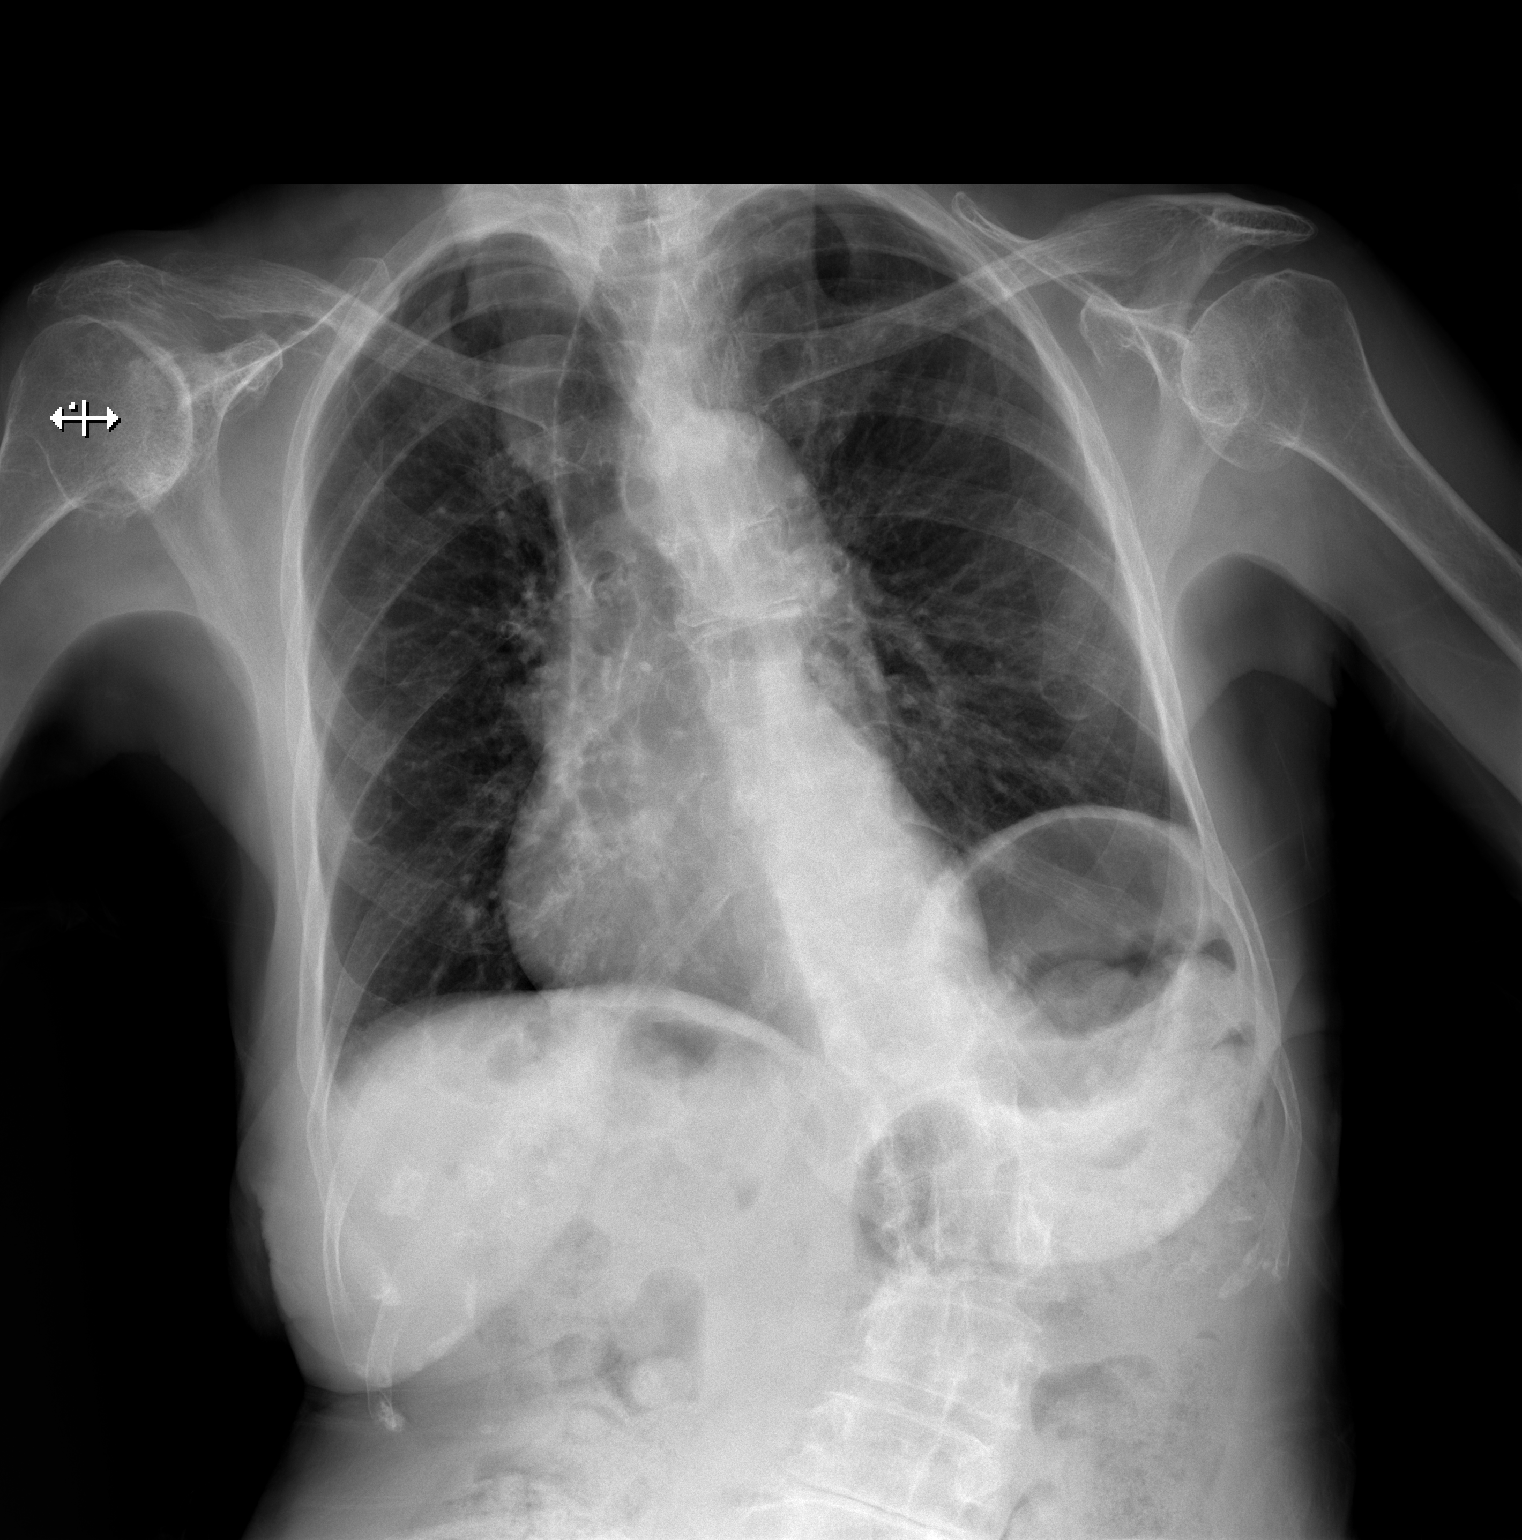

[w chest lat]
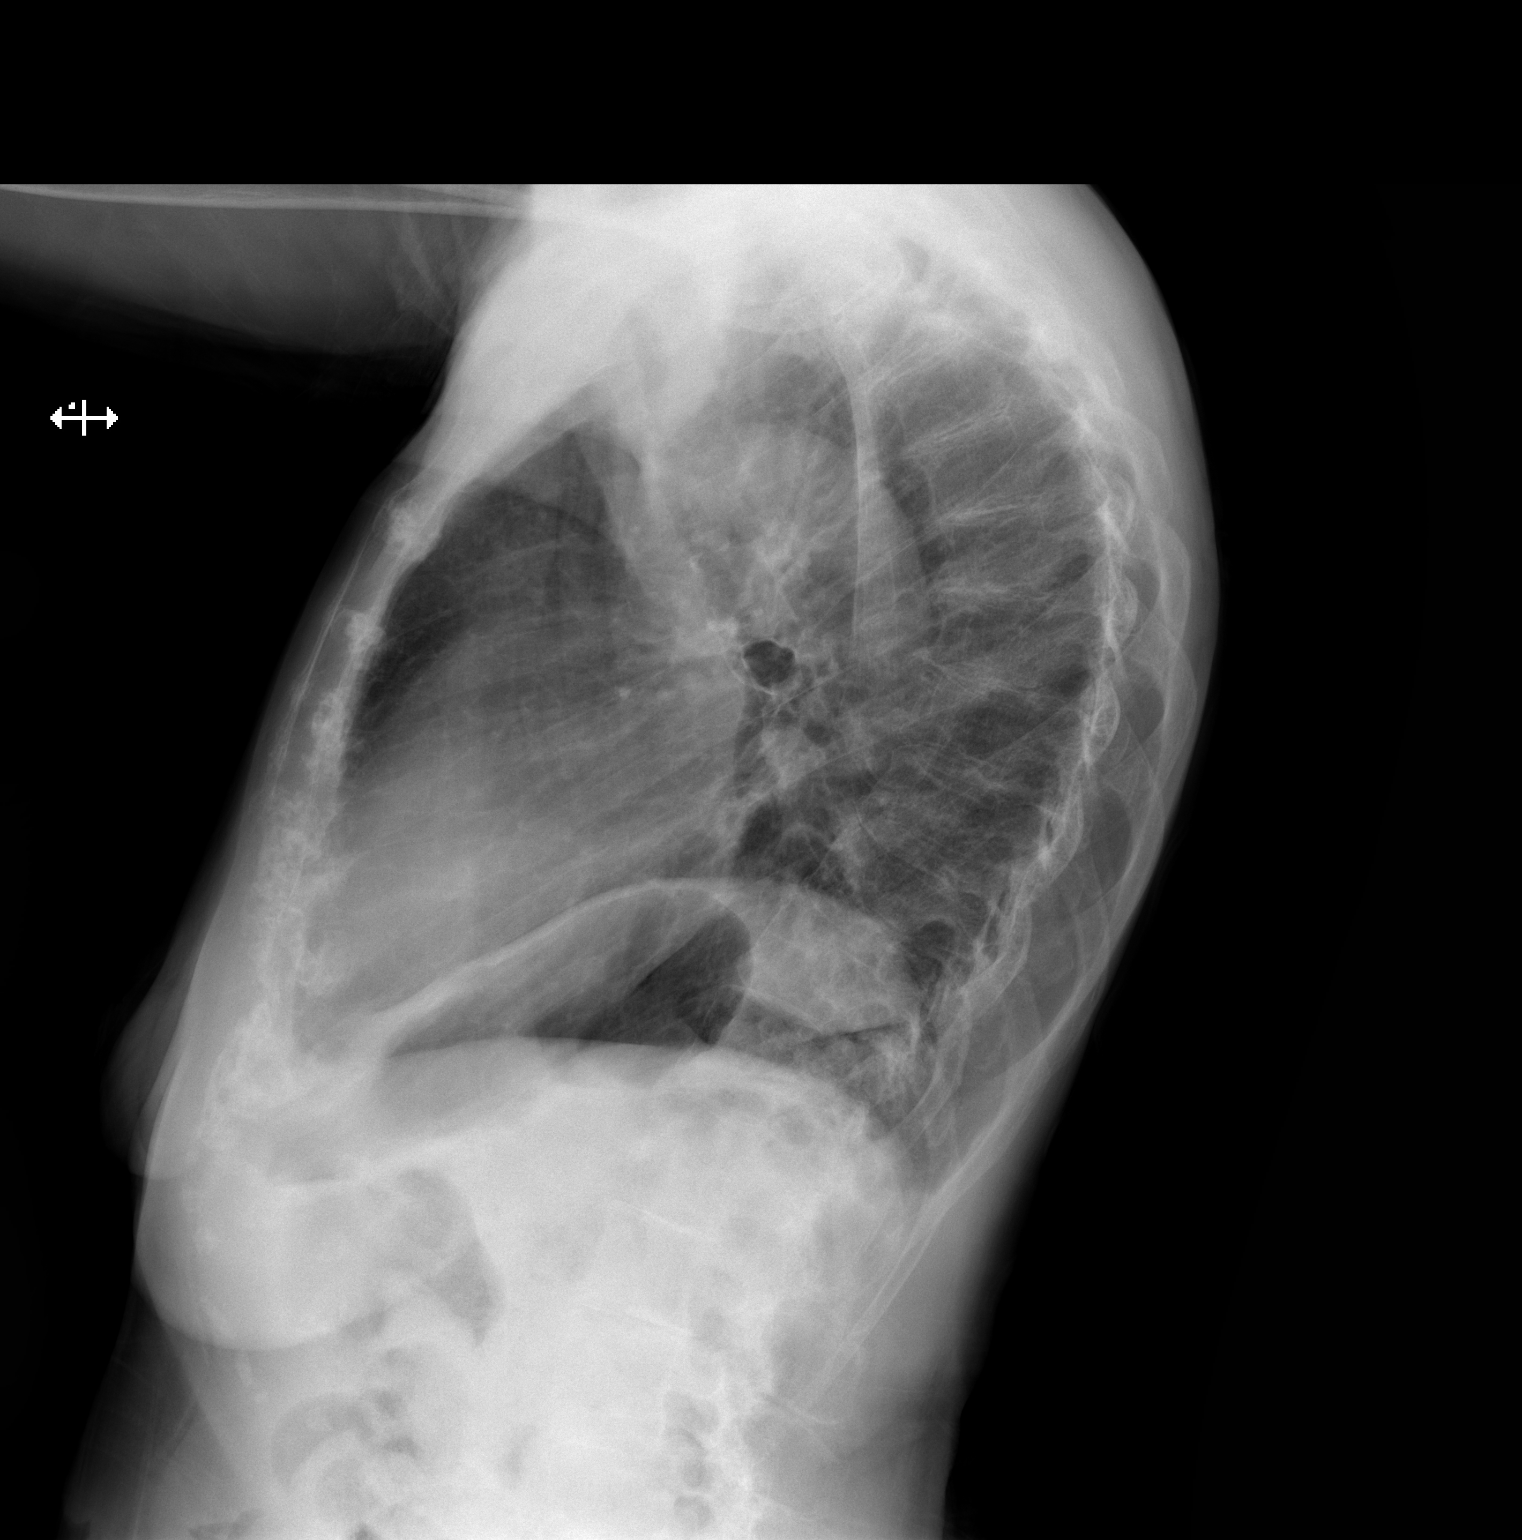

[2 of 2 positions shown; findings below may reference images not displayed]

FINDINGS: Rotated to the RIGHT.

Normal heart size, mediastinal contours, and pulmonary vascularity.

Minimal chronic central peribronchial thickening.

Lungs otherwise clear.

No infiltrate, pleural effusion, or pneumothorax.

Bones demineralized with RIGHT glenohumeral degenerative changes
noted.
IMPRESSION: Minimal chronic bronchitic changes without infiltrate.

## 2022-01-26 ENCOUNTER — Other Ambulatory Visit: Payer: Self-pay

## 2022-01-26 ENCOUNTER — Ambulatory Visit (INDEPENDENT_AMBULATORY_CARE_PROVIDER_SITE_OTHER): Payer: Medicare Other | Admitting: Sports Medicine

## 2022-01-26 ENCOUNTER — Encounter: Payer: Self-pay | Admitting: Sports Medicine

## 2022-01-26 DIAGNOSIS — L309 Dermatitis, unspecified: Secondary | ICD-10-CM

## 2022-01-26 DIAGNOSIS — M79676 Pain in unspecified toe(s): Secondary | ICD-10-CM | POA: Diagnosis not present

## 2022-01-26 DIAGNOSIS — L84 Corns and callosities: Secondary | ICD-10-CM

## 2022-01-26 DIAGNOSIS — M79674 Pain in right toe(s): Secondary | ICD-10-CM

## 2022-01-26 DIAGNOSIS — B351 Tinea unguium: Secondary | ICD-10-CM | POA: Diagnosis not present

## 2022-01-26 NOTE — Progress Notes (Signed)
?Patient ID: [name], female   DOB: 1936/10/24, 86 y.o.   MRN: [id number] ? ?Subjective: ?Kanita Gutekunst is a 86 y.o. female patient seen today in office with complaint of painful thickened and elongated toenails and corn at right second toe; unable to trim.   No other pedal complaints noted. ? ?Patient Active Problem List  ? Diagnosis Date Noted  ? Orthostatic hypotension 03/18/2019  ? Vitamin D deficiency, unspecified 03/14/2019  ? Sickle cell trait (HCC) 03/10/2019  ? Thrombocytopenia (HCC) 03/10/2019  ? Weight loss 03/10/2019  ? Infected sebaceous cyst of skin 02/19/2019  ? Right inguinal hernia 09/07/2017  ? Benign essential tremor 08/24/2017  ? Memory difficulties 08/24/2017  ? Shortness of breath 08/07/2016  ? Anemia 03/06/2016  ? Dizziness 03/06/2016  ? History of hyperlipidemia 03/06/2016  ? Hypertension 03/06/2016  ? ? ?Current Outpatient Medications on File Prior to Visit  ?Medication Sig Dispense Refill  ? diclofenac (VOLTAREN) 75 MG EC tablet Take by mouth. Pt taking 1 tab twice a day    ? gabapentin (NEURONTIN) 100 MG capsule Take 1 capsule (100 mg total) by mouth 2 (two) times daily. 60 capsule 5  ? hydrocortisone 1 % ointment Apply 1 application topically 2 (two) times daily. 30 g 0  ? ibuprofen (ADVIL) 200 MG tablet Take by mouth.    ? memantine (NAMENDA) 10 MG tablet Take 1 tablet (10 mg total) by mouth 2 (two) times daily. 60 tablet 11  ? metoprolol succinate (TOPROL-XL) 50 MG 24 hr tablet Take 1 tablet (50 mg total) by mouth daily. Take with or immediately following a meal. 90 tablet 3  ? valsartan (DIOVAN) 40 MG tablet Take 1 tablet (40 mg total) by mouth daily. 90 tablet 3  ? ?No current facility-administered medications on file prior to visit.  ? ? ?Allergies  ?Allergen Reactions  ? Aricept [Donepezil Hcl]   ?  Pt stated it caused dizziness, nausea  ? Diazepam Other (See Comments)  ?  Mental disturbance.  ? Gluten Meal Nausea Only and Other (See Comments)  ?  Severe gastric  upset-flatulence. ?Severe gastric upset-flatulence.  ? ? ?Objective: ?Physical Exam ? ?General: Well developed, nourished, no acute distress, awake, alert and oriented x 3 ? ?Vascular: Dorsalis pedis artery 1/4 bilateral, Posterior tibial artery 0/4 bilateral, skin temperature warm to warm proximal to distal bilateral lower extremities, trace edema to ankles bilateral, no varicosities, scant pedal hair present bilateral. ? ?Neurological: Gross sensation present via light touch bilateral.  ? ?Dermatological: Skin is warm, dry, and supple bilateral, Nails 1-10 are tender, extremely long, thick, and discolored with moderate to severe subungal debris and discoloration, no webspace macerations present bilateral, no open lesions present bilateral, + hyperkeratotic tissue present at right 2nd toe distal tuft.  No signs of infection noted. ? ?Musculoskeletal: Pes planus, hammertoe and bunion deformities noted bilateral; had bunionectomy many years ago. Muscular strength within normal limits without pain on range of motion. No pain with calf compression bilateral. ? ?Assessment and Plan:  ?Problem List Items Addressed This Visit   ?None ?Visit Diagnoses   ? ? Pain due to onychomycosis of toenail    -  Primary  ? Corns and callosities      ? Toe pain, right      ? Dermatitis      ? ?  ? ? ? ?-Examined patient.  ?-Discussed treatment for painful mycotic nails ?-Nails x10 were mechanically debrided in girth and with using sterile nail nipper without incident to  patient's tolerance ?-Mechanically debrided painful corn using 15 blade at right 2nd toe without incident ?-Continue with good supportive shoes daily for foot type and encouraged use of skin emollient at the tip of the right second toe of Vaseline to help with decreasing keratosis buildup. ?-May continue with Vicks vapor rub to toenails daily as tolerated ?-Patient to return in 3-4 months for nail trim or sooner if symptoms worsen. ? ?[name], DPM ? ?

## 2022-02-06 ENCOUNTER — Other Ambulatory Visit: Payer: Self-pay | Admitting: Cardiology

## 2022-02-06 DIAGNOSIS — Z79899 Other long term (current) drug therapy: Secondary | ICD-10-CM

## 2022-02-06 DIAGNOSIS — R0602 Shortness of breath: Secondary | ICD-10-CM

## 2022-03-23 ENCOUNTER — Other Ambulatory Visit (HOSPITAL_BASED_OUTPATIENT_CLINIC_OR_DEPARTMENT_OTHER): Payer: Self-pay | Admitting: Oncology

## 2022-03-23 DIAGNOSIS — R52 Pain, unspecified: Secondary | ICD-10-CM

## 2022-03-23 DIAGNOSIS — D696 Thrombocytopenia, unspecified: Secondary | ICD-10-CM

## 2022-04-14 ENCOUNTER — Ambulatory Visit (HOSPITAL_BASED_OUTPATIENT_CLINIC_OR_DEPARTMENT_OTHER)
Admission: RE | Admit: 2022-04-14 | Discharge: 2022-04-14 | Disposition: A | Discharge status: Home / Self Care | Payer: Medicare Other | Source: Ambulatory Visit | Attending: Oncology | Admitting: Oncology

## 2022-04-14 DIAGNOSIS — R52 Pain, unspecified: Secondary | ICD-10-CM | POA: Diagnosis present

## 2022-04-14 DIAGNOSIS — D696 Thrombocytopenia, unspecified: Secondary | ICD-10-CM | POA: Diagnosis present

## 2022-05-10 ENCOUNTER — Ambulatory Visit (INDEPENDENT_AMBULATORY_CARE_PROVIDER_SITE_OTHER): Payer: Medicare Other | Admitting: Podiatry

## 2022-05-10 ENCOUNTER — Ambulatory Visit: Payer: Medicare Other | Admitting: Podiatry

## 2022-05-10 ENCOUNTER — Encounter: Payer: Self-pay | Admitting: Podiatry

## 2022-05-10 DIAGNOSIS — M79674 Pain in right toe(s): Secondary | ICD-10-CM

## 2022-05-10 DIAGNOSIS — Q828 Other specified congenital malformations of skin: Secondary | ICD-10-CM

## 2022-05-10 DIAGNOSIS — I739 Peripheral vascular disease, unspecified: Secondary | ICD-10-CM

## 2022-05-10 DIAGNOSIS — M79675 Pain in left toe(s): Secondary | ICD-10-CM | POA: Diagnosis not present

## 2022-05-10 DIAGNOSIS — B351 Tinea unguium: Secondary | ICD-10-CM

## 2022-05-17 NOTE — Progress Notes (Signed)
Subjective:  Patient ID: Sheri Brown, female    DOB: 07/07/1936,  MRN: [id number]  Sheri Brown presents to clinic today for at risk foot care with h/o coagulation defect and corn(s) R 2nd toe and painful thick toenails that are difficult to trim. Painful toenails interfere with ambulation. Aggravating factors include wearing enclosed shoe gear. Pain is relieved with periodic professional debridement. Painful corns are aggravated when weightbearing when wearing enclosed shoe gear. Pain is relieved with periodic professional debridement.  New problem(s): None.   PCP is Bernita Buffy , and last visit was November 01, 2021.  Allergies  Allergen Reactions   Aricept [Donepezil Hcl]     Pt stated it caused dizziness, nausea   Diazepam Other (See Comments)    Mental disturbance.   Gluten Meal Nausea Only and Other (See Comments)    Severe gastric upset-flatulence. Severe gastric upset-flatulence.    Review of Systems: Negative except as noted in the HPI.  Objective: No changes noted in today's physical examination. Objective:   Vascular Examination: DP pulses faintly palpable b/l. PT pulses nonpalpable b/l. Pedal hair sparse b/l LE. No pedal edema b/l.  Trace edema noted b/l lower extremities.  Neurological Examination: Sensation grossly intact b/l with 10 gram monofilament. Vibratory sensation intact b/l.   Dermatological Examination: Pedal skin with normal turgor, texture and tone b/l. Toenails 2-5 b/l thick, discolored, elongated with subungual debris and pain on dorsal palpation. No hyperkeratotic lesions noted b/l. Toenail(s) bilateral great toes elongated, discolored, dystrophic, thickened >1/4 inch. Nails are crumbly with subungual debris and there is exquisite tenderness to dorsal palpation. No subungual wound(s) noted.  Musculoskeletal Examination: Muscle strength 5/5 to b/l LE. HAV with bunion bilaterally and hammertoes 2-5 b/l. Pes planus deformity noted  bilateral LE.  Radiographs: None  Assessment/Plan: 1. Pain due to onychomycosis of toenail   2. Porokeratosis   3. PAD (peripheral artery disease) (HCC)     -Patient was evaluated and treated. All patient's and/or POA's questions/concerns answered on today's visit. -Continue Vick's Vapor Rub to nails as per Dr. Wynema Birch prior instructions. -Mycotic toenails 1-5 bilaterally were debrided in length and girth with sterile nail nippers and dremel without incident. -Porokeratotic lesion(s) distal tip of right 2nd toe pared and enucleated with sterile scalpel blade without incident. Total number of lesions debrided=1. -Patient/POA to call should there be question/concern in the interim.   Return in about 9 weeks (around 07/12/2022).  Freddie Breech, DPM

## 2022-06-09 DIAGNOSIS — E860 Dehydration: Secondary | ICD-10-CM | POA: Insufficient documentation

## 2022-08-15 DIAGNOSIS — R2990 Unspecified symptoms and signs involving the nervous system: Secondary | ICD-10-CM | POA: Insufficient documentation

## 2022-08-23 ENCOUNTER — Ambulatory Visit: Payer: Medicare Other | Admitting: Podiatry

## 2022-09-28 ENCOUNTER — Telehealth: Payer: Self-pay | Admitting: Cardiology

## 2022-09-28 NOTE — Telephone Encounter (Signed)
Patient requesting to switch from Dr. Shari Prows to Dr. Servando Salina.

## 2022-10-05 NOTE — Telephone Encounter (Signed)
Patients daughter informed

## 2022-11-09 ENCOUNTER — Other Ambulatory Visit: Payer: Self-pay | Admitting: *Deleted

## 2022-11-10 ENCOUNTER — Ambulatory Visit: Payer: Medicare Other | Attending: Cardiology | Admitting: Cardiology

## 2022-11-10 ENCOUNTER — Encounter: Payer: Self-pay | Admitting: Cardiology

## 2022-11-10 VITALS — BP 128/70 | HR 67 | Ht 65.0 in | Wt 123.8 lb

## 2022-11-10 DIAGNOSIS — I10 Essential (primary) hypertension: Secondary | ICD-10-CM

## 2022-11-10 DIAGNOSIS — R42 Dizziness and giddiness: Secondary | ICD-10-CM

## 2022-11-10 MED ORDER — METOPROLOL SUCCINATE ER 25 MG PO TB24: 25.0000 mg | ORAL_TABLET | Freq: Every day | ORAL | 3 refills | Status: AC

## 2022-11-10 NOTE — Patient Instructions (Addendum)
Medication Instructions:  Your physician has recommended you make the following change in your medication:  START: Metoprolol XL 25mg  daily.  *If you need a refill on your cardiac medications before your next appointment, please call your pharmacy*  Please take her blood pressure daily for 2 weeks and fax it to our office at [phone]. Please include heart rates.   HOW TO TAKE YOUR BLOOD PRESSURE: Rest 5 minutes before taking your blood pressure. Don't smoke or drink caffeinated beverages for at least 30 minutes before. Take your blood pressure before (not after) you eat. Sit comfortably with your back supported and both feet on the floor (don't cross your legs). Elevate your arm to heart level on a table or a desk. Use the proper sized cuff. It should fit smoothly and snugly around your bare upper arm. There should be enough room to slip a fingertip under the cuff. The bottom edge of the cuff should be 1 inch above the crease of the elbow. Ideally, take 3 measurements at one sitting and record the average.   Lab Work: NONE If you have labs (blood work) drawn today and your tests are completely normal, you will receive your results only by: MyChart Message (if you have MyChart) OR A paper copy in the mail If you have any lab test that is abnormal or we need to change your treatment, we will call you to review the results.   Testing/Procedures: [phone]- Long Term Monitor Instructions  Your physician has requested you wear a ZIO patch monitor for 14 days.  This is a single patch monitor. [employer] supplies one patch monitor per enrollment. Additional stickers are not available. Please do not apply patch if you will be having a Nuclear Stress Test,  Echocardiogram, Cardiac CT, MRI, or Chest Xray during the period you would be wearing the  monitor. The patch cannot be worn during these tests. You cannot remove and re-apply the  ZIO XT patch monitor.  Your ZIO patch monitor will be mailed  3 day USPS to your address on file. It may take 3-5 days  to receive your monitor after you have been enrolled.  Once you have received your monitor, please review the enclosed instructions. Your monitor  has already been registered assigning a specific monitor serial # to you.  Billing and Patient Assistance Program Information  We have supplied [employer] with any of your insurance information on file for billing purposes. [employer] offers a sliding scale Patient Assistance Program for patients that do not have  insurance, or whose insurance does not completely cover the cost of the ZIO monitor.  You must apply for the Patient Assistance Program to qualify for this discounted rate.  To apply, please call [employer] at [phone], select option 4, select option 2, ask to apply for  Patient Assistance Program. [phone] will ask your household income, and how many people  are in your household. They will quote your out-of-pocket cost based on that information.  [employer] will also be able to set up a 13-month, interest-free payment plan if needed.  Applying the monitor   Shave hair from upper left chest.  Hold abrader disc by orange tab. Rub abrader in 40 strokes over the upper left chest as  indicated in your monitor instructions.  Clean area with 4 enclosed alcohol pads. Let dry.  Apply patch as indicated in monitor instructions. Patch will be placed under collarbone on left  side of chest with arrow pointing upward.  Rub patch adhesive  wings for 2 minutes. Remove white label marked "1". Remove the white  label marked "2". Rub patch adhesive wings for 2 additional minutes.  While looking in a mirror, press and release button in center of patch. A small green light will  flash 3-4 times. This will be your only indicator that the monitor has been turned on.  Do not shower for the first 24 hours. You may shower after the first 24 hours.  Press the button if you feel a symptom. You will hear a small  click. Record Date, Time and  Symptom in the Patient Logbook.  When you are ready to remove the patch, follow instructions on the last 2 pages of Patient  Logbook. Stick patch monitor onto the last page of Patient Logbook.  Place Patient Logbook in the blue and white box. Use locking tab on box and tape box closed  securely. The blue and white box has prepaid postage on it. Please place it in the mailbox as  soon as possible. Your physician should have your test results approximately 7 days after the  monitor has been mailed back to [facility].  Call [facility] Customer Care at [phone] if you have questions regarding  your ZIO XT patch monitor. Call them immediately if you see an orange light blinking on your  monitor.  If your monitor falls off in less than 4 days, contact our Monitor department at [phone].  If your monitor becomes loose or falls off after 4 days call [employer] at [phone] for  suggestions on securing your monitor    Follow-Up: At [facility], you and your health needs are our priority.  As part of our continuing mission to provide you with exceptional heart care, we have created designated Provider Care Teams.  These Care Teams include your primary Cardiologist (physician) and Advanced Practice Providers (APPs -  Physician Assistants and Nurse Practitioners) who all work together to provide you with the care you need, when you need it.  We recommend signing up for the patient portal called "MyChart".  Sign up information is provided on this After Visit Summary.  MyChart is used to connect with patients for Virtual Visits (Telemedicine).  Patients are able to view lab/test results, encounter notes, upcoming appointments, etc.  Non-urgent messages can be sent to your provider as well.   To learn more about what you can do with MyChart, go to [email or web address].    Your next appointment:   12 week(s)  The format for your next appointment:    In Person  Provider:   [name], DO

## 2022-11-10 NOTE — Progress Notes (Signed)
Cardiology Office Note:    Date:  11/11/2022   ID:  Sheri Brown, DOB [birth year], MRN [id number]  PCP:  [name], PA-C  Cardiologist:  [name], DO  Electrophysiologist:  None   Referring MD: [name], *   " I am dizzy everyday"  History of Present Illness:    Sheri Brown is a 86 y.o. female with a hx of HTN, HLD, GERD, and benign essential tremor who returns to clinic for follow-up.   Was previously followed by Dr. Jacinto Brown. Was last seen there on 08/07/16 where she underwent RHC which showed normal right heart catheterizaton with preserved cardiac output and cardiac index.  She was then seen by Dr. Shari Brown - recent visit was was 02/11/2021 at that time she was referred to pulmonary for her DOE - her Sheri Brown was also discussed which was normal. She was hypertensive metoprolol was to 50 mg and valsartan was started.   Since her visit she has been hospitalized at [facility] based on her discharge summary her primary diagnosis was noted to be strokelike symptoms with hypertensive emergency which was noted to be resolved during the time of her hospitalization.  One of the biggest problems that was there concerning was the fact that she was battling dizziness.  She had been started on Florinef as well as midodrine.  Metoprolol was discontinued at the time.  She was also asked to wear compression stocking and abdominal binder.  Today she is here with a friend.  She reports that she had been experiencing significant dizziness.  Off-and-on.  She has not really passed out.  Past Medical History:  Diagnosis Date   Anemia    Arthritis    OA hands   Benign essential tremor 08/24/2017   Complication of anesthesia    "doesnt need alot of anesthesia"   GERD (gastroesophageal reflux disease)    History of hyperlipidemia    Hypertension 03/06/2016   Infected sebaceous cyst    Memory difficulties 08/24/2017   Orthostatic hypotension    PONV (postoperative nausea and  vomiting)    Right inguinal hernia 09/07/2017   Sickle cell trait (HCC)    Thrombocytopathia (HCC)    Vitamin D deficiency    Weight loss     Past Surgical History:  Procedure Laterality Date   ABDOMINAL HYSTERECTOMY     BLADDER SUSPENSION     BUNIONECTOMY     CARDIAC CATHETERIZATION N/A 08/08/2016   Procedure: Right Heart Cath;  Surgeon: Yates Decamp, MD;  Location: [facility] INVASIVE CV LAB;  Service: Cardiovascular;  Laterality: N/A;   EYE SURGERY     cataract   INGUINAL HERNIA REPAIR Right 09/07/2017   Procedure: OPEN REPAIR RIGHT INGUINAL HERNIA WITH MESH;  Surgeon: Claud Kelp, MD;  Location: [facility];  Service: General;  Laterality: Right;   INSERTION OF MESH Right 09/07/2017   Procedure: INSERTION OF MESH;  Surgeon: Claud Kelp, MD;  Location: [facility];  Service: General;  Laterality: Right;   JOINT REPLACEMENT Right     Current Medications: Current Meds  Medication Sig   celecoxib (CELEBREX) 100 MG capsule Take 100 mg by mouth daily.   fludrocortisone (FLORINEF) 0.1 MG tablet Take 0.1 mg by mouth every Monday, Wednesday, and Friday.   metoprolol succinate (TOPROL XL) 25 MG 24 hr tablet Take 1 tablet (25 mg total) by mouth daily.   midodrine (PROAMATINE) 2.5 MG tablet TAKE 2 TABLETS BY MOUTH THREE TIMES DAILY AFTER A MEAL   [DISCONTINUED]  metoprolol succinate (TOPROL-XL) 25 MG 24 hr tablet Take 12.5 mg by mouth daily.     Allergies:   Aricept [donepezil hcl], Diazepam, and Gluten meal   Social History   Socioeconomic History   Marital status: Widowed    Spouse name: Not on file   Number of children: 3   Years of education: 16+   Highest education level: Not on file  Occupational History   Not on file  Tobacco Use   Smoking status: Never   Smokeless tobacco: Never  Vaping Use   Vaping Use: Never used  Substance and Sexual Activity   Alcohol use: No   Drug use: No   Sexual activity: Not on file  Other Topics Concern   Not on  file  Social History Narrative   Lives alone   Right handed    Caffeine use: none   Social Determinants of Health   Financial Resource Strain: Not on file  Food Insecurity: Not on file  Transportation Needs: Not on file  Physical Activity: Not on file  Stress: Not on file  Social Connections: Not on file     Family History: The patient's family history includes Cancer in her father; Hypertension in her mother.  ROS:   Review of Systems  Constitution: Negative for decreased appetite, fever and weight gain.  HENT: Negative for congestion, ear discharge, hoarse voice and sore throat.   Eyes: Negative for discharge, redness, vision loss in right eye and visual halos.  Cardiovascular: Negative for chest pain, dyspnea on exertion, leg swelling, orthopnea and palpitations.  Respiratory: Negative for cough, hemoptysis, shortness of breath and snoring.   Endocrine: Negative for heat intolerance and polyphagia.  Hematologic/Lymphatic: Negative for bleeding problem. Does not bruise/bleed easily.  Skin: Negative for flushing, nail changes, rash and suspicious lesions.  Musculoskeletal: Negative for arthritis, joint pain, muscle cramps, myalgias, neck pain and stiffness.  Gastrointestinal: Negative for abdominal pain, bowel incontinence, diarrhea and excessive appetite.  Genitourinary: Negative for decreased libido, genital sores and incomplete emptying.  Neurological: Negative for brief paralysis, focal weakness, headaches and loss of balance.  Psychiatric/Behavioral: Negative for altered mental status, depression and suicidal ideas.  Allergic/Immunologic: Negative for HIV exposure and persistent infections.    EKGs/Labs/Other Studies Reviewed:    The following studies were reviewed today:   EKG:  The ekg ordered today demonstrates   Recent Labs: No results found for requested labs within last 365 days.  Recent Lipid Panel No results found for: "CHOL", "TRIG", "HDL", "CHOLHDL",  "VLDL", "LDLCALC", "LDLDIRECT"  Physical Exam:    VS:  BP 128/70   Pulse 67   Ht 5\' 5"  (1.651 m)   Wt 123 lb 12.8 oz (56.2 kg)   SpO2 96%   BMI 20.60 kg/m     Wt Readings from Last 3 Encounters:  11/10/22 123 lb 12.8 oz (56.2 kg)  03/30/21 127 lb 9.6 oz (57.9 kg)  02/11/21 129 lb (58.5 kg)     GEN: Well nourished, well developed in no acute distress HEENT: Normal NECK: No JVD; No carotid bruits LYMPHATICS: No lymphadenopathy CARDIAC: S1S2 noted,RRR, no murmurs, rubs, gallops RESPIRATORY:  Clear to auscultation without rales, wheezing or rhonchi  ABDOMEN: Soft, non-tender, non-distended, +bowel sounds, no guarding. EXTREMITIES: No edema, No cyanosis, no clubbing MUSCULOSKELETAL:  No deformity  SKIN: Warm and dry NEUROLOGIC:  Alert and oriented x 3, non-focal PSYCHIATRIC:  Normal affect, good insight  ASSESSMENT:    1. Dizziness   2. Primary hypertension  PLAN:    I am concerned about her dizziness.  Some beta-blocker 50 mg daily.  I like to cut this back to 25 mg as well as monitor her blood pressure.  It will be beneficial to place a ZIO monitor on the patient to make sure the sinus node dysfunction is not playing a role here.  She will remain off of Florinef 3 times a week as well as continue her midodrine daily.  Has plans for follow-up with the autonomic specialist at [facility] I think this is a good idea.  In time if symptoms starts to persist and get worse we will have her put back on her abdominal binder and use support stockings.  We also talked about using the rollator which she is very happy to get adjusted to using this.  To help with stability and prevent falls.  The patient is in agreement with the above plan. The patient left the office in stable condition.  The patient will follow up in   Medication Adjustments/Labs and Tests Ordered: Current medicines are reviewed at length with the patient today.  Concerns regarding medicines are outlined above.   Orders Placed This Encounter  Procedures   LONG TERM MONITOR (3-14 DAYS)   EKG 12-Lead   Meds ordered this encounter  Medications   metoprolol succinate (TOPROL XL) 25 MG 24 hr tablet    Sig: Take 1 tablet (25 mg total) by mouth daily.    Dispense:  90 tablet    Refill:  3    Patient Instructions  Medication Instructions:  Your physician has recommended you make the following change in your medication:  START: Metoprolol XL 25mg  daily.  *If you need a refill on your cardiac medications before your next appointment, please call your pharmacy*  Please take her blood pressure daily for 2 weeks and fax it to our office at [phone]. Please include heart rates.   HOW TO TAKE YOUR BLOOD PRESSURE: Rest 5 minutes before taking your blood pressure. Don't smoke or drink caffeinated beverages for at least 30 minutes before. Take your blood pressure before (not after) you eat. Sit comfortably with your back supported and both feet on the floor (don't cross your legs). Elevate your arm to heart level on a table or a desk. Use the proper sized cuff. It should fit smoothly and snugly around your bare upper arm. There should be enough room to slip a fingertip under the cuff. The bottom edge of the cuff should be 1 inch above the crease of the elbow. Ideally, take 3 measurements at one sitting and record the average.   Lab Work: NONE If you have labs (blood work) drawn today and your tests are completely normal, you will receive your results only by: MyChart Message (if you have MyChart) OR A paper copy in the mail If you have any lab test that is abnormal or we need to change your treatment, we will call you to review the results.   Testing/Procedures: Christena Deem- Long Term Monitor Instructions  Your physician has requested you wear a ZIO patch monitor for 14 days.  This is a single patch monitor. [employer] supplies one patch monitor per enrollment. Additional stickers are not available.  Please do not apply patch if you will be having a Nuclear Stress Test,  Echocardiogram, Cardiac CT, MRI, or Chest Xray during the period you would be wearing the  monitor. The patch cannot be worn during these tests. You cannot remove and re-apply the  ZIO XT patch monitor.  Your ZIO patch monitor will be mailed 3 day USPS to your address on file. It may take 3-5 days  to receive your monitor after you have been enrolled.  Once you have received your monitor, please review the enclosed instructions. Your monitor  has already been registered assigning a specific monitor serial # to you.  Billing and Patient Assistance Program Information  We have supplied [employer] with any of your insurance information on file for billing purposes. [employer] offers a sliding scale Patient Assistance Program for patients that do not have  insurance, or whose insurance does not completely cover the cost of the ZIO monitor.  You must apply for the Patient Assistance Program to qualify for this discounted rate.  To apply, please call [employer] at [phone], select option 4, select option 2, ask to apply for  Patient Assistance Program. Meredeth Ide will ask your household income, and how many people  are in your household. They will quote your out-of-pocket cost based on that information.  [employer] will also be able to set up a 35-month, interest-free payment plan if needed.  Applying the monitor   Shave hair from upper left chest.  Hold abrader disc by orange tab. Rub abrader in 40 strokes over the upper left chest as  indicated in your monitor instructions.  Clean area with 4 enclosed alcohol pads. Let dry.  Apply patch as indicated in monitor instructions. Patch will be placed under collarbone on left  side of chest with arrow pointing upward.  Rub patch adhesive wings for 2 minutes. Remove white label marked "1". Remove the white  label marked "2". Rub patch adhesive wings for 2 additional minutes.  While  looking in a mirror, press and release button in center of patch. A small green light will  flash 3-4 times. This will be your only indicator that the monitor has been turned on.  Do not shower for the first 24 hours. You may shower after the first 24 hours.  Press the button if you feel a symptom. You will hear a small click. Record Date, Time and  Symptom in the Patient Logbook.  When you are ready to remove the patch, follow instructions on the last 2 pages of Patient  Logbook. Stick patch monitor onto the last page of Patient Logbook.  Place Patient Logbook in the blue and white box. Use locking tab on box and tape box closed  securely. The blue and white box has prepaid postage on it. Please place it in the mailbox as  soon as possible. Your physician should have your test results approximately 7 days after the  monitor has been mailed back to [facility].  Call [facility] Customer Care at [phone] if you have questions regarding  your ZIO XT patch monitor. Call them immediately if you see an orange light blinking on your  monitor.  If your monitor falls off in less than 4 days, contact our Monitor department at [phone].  If your monitor becomes loose or falls off after 4 days call [employer] at [phone] for  suggestions on securing your monitor    Follow-Up: At [facility], you and your health needs are our priority.  As part of our continuing mission to provide you with exceptional heart care, we have created designated Provider Care Teams.  These Care Teams include your primary Cardiologist (physician) and Advanced Practice Providers (APPs -  Physician Assistants and Nurse Practitioners) who all work together to provide you with  the care you need, when you need it.  We recommend signing up for the patient portal called "MyChart".  Sign up information is provided on this After Visit Summary.  MyChart is used to connect with patients for Virtual Visits  (Telemedicine).  Patients are able to view lab/test results, encounter notes, upcoming appointments, etc.  Non-urgent messages can be sent to your provider as well.   To learn more about what you can do with MyChart, go to [email or web address].    Your next appointment:   12 week(s)  The format for your next appointment:   In Person  Provider:   [name], DO      Adopting a Healthy Lifestyle.  Know what a healthy weight is for you (roughly BMI <25) and aim to maintain this   Aim for 7+ servings of fruits and vegetables daily   65-80+ fluid ounces of water or unsweet tea for healthy kidneys   Limit to max 1 drink of alcohol per day; avoid smoking/tobacco   Limit animal fats in diet for cholesterol and heart health - choose grass fed whenever available   Avoid highly processed foods, and foods high in saturated/trans fats   Aim for low stress - take time to unwind and care for your mental health   Aim for 150 min of moderate intensity exercise weekly for heart health, and weights twice weekly for bone health   Aim for 7-9 hours of sleep daily   When it comes to diets, agreement about the perfect plan isnt easy to find, even among the experts. Experts at the [facility] [employer] developed an idea known as the Healthy Eating Plate. Just imagine a plate divided into logical, healthy portions.   The emphasis is on diet quality:   Load up on vegetables and fruits - one-half of your plate: Aim for color and variety, and remember that potatoes dont count.   Go for whole grains - one-quarter of your plate: Whole wheat, barley, wheat berries, quinoa, oats, brown rice, and foods made with them. If you want pasta, go with whole wheat pasta.   Protein power - one-quarter of your plate: Fish, chicken, beans, and nuts are all healthy, versatile protein sources. Limit red meat.   The diet, however, does go beyond the plate, offering a few other suggestions.   Use  healthy plant oils, such as olive, canola, soy, corn, sunflower and peanut. Check the labels, and avoid partially hydrogenated oil, which have unhealthy trans fats.   If youre thirsty, drink water. Coffee and tea are good in moderation, but skip sugary drinks and limit milk and dairy products to one or two daily servings.   The type of carbohydrate in the diet is more important than the amount. Some sources of carbohydrates, such as vegetables, fruits, whole grains, and beans-are healthier than others.   Finally, stay active  Signed, [name], DO  11/11/2022 11:35 AM    [facility]

## 2022-11-14 ENCOUNTER — Ambulatory Visit: Payer: Medicare Other | Attending: Cardiology

## 2022-11-14 DIAGNOSIS — R42 Dizziness and giddiness: Secondary | ICD-10-CM

## 2022-11-14 NOTE — Progress Notes (Unsigned)
Enrolled patient for a 14 day Zio XT  monitor to be mailed to patients home  °

## 2022-11-21 DIAGNOSIS — R42 Dizziness and giddiness: Secondary | ICD-10-CM | POA: Diagnosis not present

## 2022-12-06 ENCOUNTER — Encounter: Payer: Self-pay | Admitting: Podiatry

## 2022-12-06 ENCOUNTER — Ambulatory Visit (INDEPENDENT_AMBULATORY_CARE_PROVIDER_SITE_OTHER): Payer: Medicare Other | Admitting: Podiatry

## 2022-12-06 VITALS — BP 182/96

## 2022-12-06 DIAGNOSIS — R030 Elevated blood-pressure reading, without diagnosis of hypertension: Secondary | ICD-10-CM | POA: Diagnosis not present

## 2022-12-06 DIAGNOSIS — I739 Peripheral vascular disease, unspecified: Secondary | ICD-10-CM

## 2022-12-06 DIAGNOSIS — R460 Very low level of personal hygiene: Secondary | ICD-10-CM | POA: Diagnosis not present

## 2022-12-06 DIAGNOSIS — B351 Tinea unguium: Secondary | ICD-10-CM | POA: Diagnosis not present

## 2022-12-06 DIAGNOSIS — M79676 Pain in unspecified toe(s): Secondary | ICD-10-CM

## 2022-12-06 NOTE — Progress Notes (Signed)
Subjective:  Patient ID: Sheri Brown, female    DOB: 1936-01-26,  MRN: [id number]  Sheri Brown presents to clinic today for at risk foot care. Patient has h/o PAD and painful elongated mycotic toenails 1-5 bilaterally which are tender when wearing enclosed shoe gear. Pain is relieved with periodic professional debridement.  Chief Complaint  Patient presents with   Nail Problem    RFC PCP-[name], Sheri Brown PCP VST-Summer 2023   New problem(s): None.   Patient is accompanied by her daughter on today's visit. They state she has been having problems with her blood pressure and are working on medication changes.  She has had systolics in the 200's. She relates no symptoms such as vision changes, no headaches, no dizziness, no chest pain.  PCP is Sheri Kill, PA-C.  Allergies  Allergen Reactions   Aricept [Donepezil Hcl]     Pt stated it caused dizziness, nausea   Diazepam Other (See Comments)    Mental disturbance.   Gluten Meal Nausea Only and Other (See Comments)    Severe gastric upset-flatulence. Severe gastric upset-flatulence.    Review of Systems: Negative except as noted in the HPI.  Objective: No changes noted in today's physical examination. Vitals:   12/06/22 1017 12/06/22 1026  BP: (!) 215/110 (!) 182/96 (manual blood pressure reading)   Sheri Brown is a pleasant 87 y.o. female WD, WN in NAD. AAO x 3.  Vascular Examination: DP pulses faintly palpable b/l. PT pulses nonpalpable b/l. Pedal hair sparse b/l LE. No pedal edema b/l.  Nonpitting edema noted b/l ankles.   Neurological Examination: Sensation grossly intact b/l with 10 gram monofilament. Vibratory sensation intact b/l.   Dermatological Examination: Pedal skin with normal turgor, texture and tone b/l. Toenails 2-5 b/l thick, discolored, elongated with subungual debris and pain on dorsal palpation.   No hyperkeratotic lesions noted b/l.   Pedal skin noted to exhibit signs of poor pedal hygiene  with noted foot odor b/l and interdigital debris. No open wounds noted.  Toenail(s) bilateral great toes elongated, discolored, dystrophic, thickened >1/4 inch. Nails are crumbly with subungual debris and there is exquisite tenderness to dorsal palpation. No subungual wound(s) noted.  Musculoskeletal Examination: Muscle strength 5/5 to b/l LE. HAV with bunion bilaterally and hammertoes 2-5 b/l. Pes planus deformity noted bilateral LE.  Radiographs: None  Assessment/Plan: 1. Pain due to onychomycosis of toenail   2. Poor hygiene   3. Elevated blood pressure reading   4. PAD (peripheral artery disease) (HCC)     No orders of the defined types were placed in this encounter.   -Patient was evaluated and treated. All patient's and/or POA's questions/concerns answered on today's visit. -Examined patient. -Discussed elevated blood pressure reading with patient. Patient advised to discuss blood pressure with PCP/Cardiologist. Patient/Family/Caregiver/POA related understanding. Will forward note to Dr. Thomasene Brown, Cardiologist. -Discussed interdigital pedal hygiene with her caregiver. She took pictures to show to Ms. Sheri Brown daughter and will discuss with her CNA. Written instructions given for weekly hygiene foot soaks. -Patient to continue soft, supportive shoe gear daily. -Toenails 1-5 b/l were debrided in length and girth with sterile nail nippers and dremel without iatrogenic bleeding.  -Patient/POA to call should there be question/concern in the interim.   Return in about 3 months (around 03/07/2023).  Sheri Brown, DPM

## 2022-12-06 NOTE — Patient Instructions (Signed)
WEEKLY FOOT SOAK INSTRUCTIONS FOR FOOT HYGIENE   SOAK FEET IN LUKEWARM SOAPY WATER FOR 10 MINUTES. HAVE A FAMILY MEMBER OR CAREGIVER CHECK THE WATER TEMPERATURE FOR YOU BEFORE SUBMERGING YOUR FEET IN THE WATER.  2.  DRY FEET WELL TAKING CARE TO DRY WELL BETWEEN TOES AND UNDER TOES.  3.  APPLY MOISTURIZING CREAM TO FEET AVOIDING APPLICATION BETWEEN TOES.  For normal skin: Moisturize feet once daily; do not apply between toes A.  CeraVe Daily Moisturizing Lotion B.  Vaseline Intensive Care Lotion C.  Lubriderm Lotion D.  Gold Bond Diabetic Foot Lotion E.  Eucerin Intensive Repair Moisturizing Lotion

## 2023-02-06 ENCOUNTER — Ambulatory Visit: Payer: Medicare Other | Admitting: Cardiology

## 2023-03-14 ENCOUNTER — Ambulatory Visit: Payer: Medicare Other | Admitting: Podiatry

## 2023-03-21 ENCOUNTER — Ambulatory Visit (INDEPENDENT_AMBULATORY_CARE_PROVIDER_SITE_OTHER): Payer: Medicare Other | Admitting: Podiatry

## 2023-03-21 VITALS — BP 183/83

## 2023-03-21 DIAGNOSIS — M79676 Pain in unspecified toe(s): Secondary | ICD-10-CM | POA: Diagnosis not present

## 2023-03-21 DIAGNOSIS — B351 Tinea unguium: Secondary | ICD-10-CM

## 2023-03-21 DIAGNOSIS — I739 Peripheral vascular disease, unspecified: Secondary | ICD-10-CM

## 2023-03-24 ENCOUNTER — Encounter: Payer: Self-pay | Admitting: Podiatry

## 2023-03-24 NOTE — Progress Notes (Signed)
Subjective:  Patient ID: Sheri Brown, female    DOB: October 27, 1936,  MRN: [id number]  Sheri Brown presents to clinic today for at risk foot care. Patient has h/o PAD. She is accompanied by her daughter. She lives at [facility] [place]. Patient does not remember if she has taken her blood pressure medication on today's visit. Chief Complaint  Patient presents with   Nail Problem.     RFC PCP-[name] PCP VST-do not know   New problem(s): None.   PCP is Sheri Kill, PA-C.  Allergies  Allergen Reactions   Aricept [Donepezil Hcl]     Pt stated it caused dizziness, nausea   Diazepam Other (See Comments)    Mental disturbance.   Gluten Meal Nausea Only and Other (See Comments)    Severe gastric upset-flatulence. Severe gastric upset-flatulence.    Review of Systems: Negative except as noted in the HPI.  Objective:  Vitals:   03/21/23 1027  BP: (!) 183/83   Sheri Brown is a pleasant 87 y.o. female WD, WN in NAD. AAO x 3.  Vascular Examination: DP pulses faintly palpable b/l. PT pulses nonpalpable b/l. Pedal hair sparse b/l LE. No pedal edema b/l.  Nonpitting edema noted b/l ankles.   Neurological Examination: Sensation grossly intact b/l with 10 gram monofilament. Vibratory sensation intact b/l.   Dermatological Examination: Pedal skin with normal turgor, texture and tone b/l. Toenails 2-5 b/l thick, discolored, elongated with subungual debris and pain on dorsal palpation.   No hyperkeratotic lesions noted b/l.   Toenail(s) bilateral great toes elongated, discolored, dystrophic, thickened >1/4 inch. Nails are crumbly with subungual debris and there is exquisite tenderness to dorsal palpation. No subungual wound(s) noted.  Musculoskeletal Examination: Muscle strength 5/5 to b/l LE. HAV with bunion bilaterally and hammertoes 2-5 b/l. Pes planus deformity noted bilateral LE.  Radiographs: None  Assessment/Plan: 1. Pain due to onychomycosis of toenail   2. PAD  (peripheral artery disease) (HCC)   -Consent given for treatment as described below: -Examined patient. -Continue supportive shoe gear daily. -Mycotic toenails 1-5 bilaterally were debrided in length and girth with sterile nail nippers and dremel without incident. -Patient/POA to call should there be question/concern in the interim.   Return in about 3 months (around 06/21/2023).  Freddie Breech, DPM

## 2023-06-25 ENCOUNTER — Ambulatory Visit (INDEPENDENT_AMBULATORY_CARE_PROVIDER_SITE_OTHER): Payer: Medicare Other | Admitting: Podiatry

## 2023-06-25 ENCOUNTER — Encounter: Payer: Self-pay | Admitting: Podiatry

## 2023-06-25 VITALS — BP 136/68 | HR 77 | Ht 65.0 in | Wt 123.0 lb

## 2023-06-25 DIAGNOSIS — I739 Peripheral vascular disease, unspecified: Secondary | ICD-10-CM

## 2023-06-25 DIAGNOSIS — M79676 Pain in unspecified toe(s): Secondary | ICD-10-CM | POA: Diagnosis not present

## 2023-06-25 DIAGNOSIS — B351 Tinea unguium: Secondary | ICD-10-CM

## 2023-06-25 NOTE — Progress Notes (Signed)
Subjective:  Patient ID: Sheri Brown, female    DOB: February 24, 1936,   MRN: [id number]  Chief Complaint  Patient presents with   Nail Problem    Needs nails trimmed     87 y.o. female presents for concern of thickened elongated and painful nails that are difficult to trim. Requesting to have them trimmed today. Patient with history of PAD and at risk for foot care. Marland Kitchen   PCP:  [name], PA-C    . Denies any other pedal complaints. Denies n/v/f/c.   Past Medical History:  Diagnosis Date   Anemia    Arthritis    OA hands   Benign essential tremor 08/24/2017   Complication of anesthesia    "doesnt need alot of anesthesia"   GERD (gastroesophageal reflux disease)    History of hyperlipidemia    Hypertension 03/06/2016   Infected sebaceous cyst    Memory difficulties 08/24/2017   Orthostatic hypotension    PONV (postoperative nausea and vomiting)    Right inguinal hernia 09/07/2017   Sickle cell trait (HCC)    Thrombocytopathia (HCC)    Vitamin D deficiency    Weight loss     Objective:  Physical Exam: Vascular: DP/PT pulses 2/4 bilateral. CFT <3 seconds. Absent hair growth on digits. No edema noted to bilateral lower extremities. Xerosis noted bilaterally.  Skin. No lacerations or abrasions bilateral feet. Nails 1-5 bilateral  are thickened discolored and elongated with subungual debris.  Musculoskeletal: MMT 5/5 bilateral lower extremities in DF, PF, Inversion and Eversion. Deceased ROM in DF of ankle joint.  Neurological: Sensation intact to light touch. Protective sensation intact bilateral.    Assessment:   1. Pain due to onychomycosis of toenail   2. PAD (peripheral artery disease) (HCC)      Plan:  Patient was evaluated and treated and all questions answered. -Mechanically debrided all nails 1-5 bilateral using sterile nail nipper and filed with dremel without incident  -Answered all patient questions -Patient to return  in 3 months for at risk foot  care -Patient advised to call the office if any problems or questions arise in the meantime.   Louann Sjogren, DPM

## 2023-07-06 ENCOUNTER — Encounter: Payer: Self-pay | Admitting: Cardiology

## 2023-07-06 MED ORDER — MIDODRINE HCL 2.5 MG PO TABS: 5.0000 mg | ORAL_TABLET | Freq: Three times a day (TID) | ORAL | 0 refills | Status: AC

## 2023-08-27 ENCOUNTER — Encounter (HOSPITAL_COMMUNITY): Payer: Self-pay | Admitting: Orthopedic Surgery

## 2023-08-27 ENCOUNTER — Other Ambulatory Visit: Payer: Self-pay

## 2023-08-27 ENCOUNTER — Inpatient Hospital Stay (HOSPITAL_COMMUNITY)
Admit: 2023-08-27 | Discharge: 2023-09-06 | DRG: 467 | Disposition: A | Discharge status: Skilled Nursing Facility | Payer: Medicare Other | Attending: Orthopedic Surgery | Admitting: Orthopedic Surgery

## 2023-08-27 ENCOUNTER — Encounter (HOSPITAL_COMMUNITY): Payer: Self-pay

## 2023-08-27 DIAGNOSIS — I5032 Chronic diastolic (congestive) heart failure: Secondary | ICD-10-CM | POA: Diagnosis present

## 2023-08-27 DIAGNOSIS — R54 Age-related physical debility: Secondary | ICD-10-CM | POA: Diagnosis present

## 2023-08-27 DIAGNOSIS — E785 Hyperlipidemia, unspecified: Secondary | ICD-10-CM | POA: Diagnosis present

## 2023-08-27 DIAGNOSIS — Z8249 Family history of ischemic heart disease and other diseases of the circulatory system: Secondary | ICD-10-CM

## 2023-08-27 DIAGNOSIS — D649 Anemia, unspecified: Secondary | ICD-10-CM | POA: Diagnosis not present

## 2023-08-27 DIAGNOSIS — D573 Sickle-cell trait: Secondary | ICD-10-CM | POA: Diagnosis present

## 2023-08-27 DIAGNOSIS — Z79899 Other long term (current) drug therapy: Secondary | ICD-10-CM

## 2023-08-27 DIAGNOSIS — I951 Orthostatic hypotension: Secondary | ICD-10-CM | POA: Diagnosis present

## 2023-08-27 DIAGNOSIS — M19042 Primary osteoarthritis, left hand: Secondary | ICD-10-CM | POA: Diagnosis present

## 2023-08-27 DIAGNOSIS — Z8673 Personal history of transient ischemic attack (TIA), and cerebral infarction without residual deficits: Secondary | ICD-10-CM | POA: Diagnosis not present

## 2023-08-27 DIAGNOSIS — Z8639 Personal history of other endocrine, nutritional and metabolic disease: Secondary | ICD-10-CM

## 2023-08-27 DIAGNOSIS — T84090A Other mechanical complication of internal right hip prosthesis, initial encounter: Secondary | ICD-10-CM | POA: Diagnosis present

## 2023-08-27 DIAGNOSIS — E559 Vitamin D deficiency, unspecified: Secondary | ICD-10-CM | POA: Diagnosis present

## 2023-08-27 DIAGNOSIS — G909 Disorder of the autonomic nervous system, unspecified: Secondary | ICD-10-CM | POA: Diagnosis present

## 2023-08-27 DIAGNOSIS — I739 Peripheral vascular disease, unspecified: Secondary | ICD-10-CM | POA: Diagnosis present

## 2023-08-27 DIAGNOSIS — M19041 Primary osteoarthritis, right hand: Secondary | ICD-10-CM | POA: Diagnosis present

## 2023-08-27 DIAGNOSIS — I130 Hypertensive heart and chronic kidney disease with heart failure and stage 1 through stage 4 chronic kidney disease, or unspecified chronic kidney disease: Secondary | ICD-10-CM | POA: Diagnosis present

## 2023-08-27 DIAGNOSIS — N189 Chronic kidney disease, unspecified: Secondary | ICD-10-CM | POA: Diagnosis not present

## 2023-08-27 DIAGNOSIS — Z5982 Transportation insecurity: Secondary | ICD-10-CM | POA: Diagnosis not present

## 2023-08-27 DIAGNOSIS — Z751 Person awaiting admission to adequate facility elsewhere: Secondary | ICD-10-CM

## 2023-08-27 DIAGNOSIS — Y792 Prosthetic and other implants, materials and accessory orthopedic devices associated with adverse incidents: Secondary | ICD-10-CM | POA: Diagnosis present

## 2023-08-27 DIAGNOSIS — M25551 Pain in right hip: Secondary | ICD-10-CM

## 2023-08-27 DIAGNOSIS — D696 Thrombocytopenia, unspecified: Secondary | ICD-10-CM | POA: Diagnosis present

## 2023-08-27 DIAGNOSIS — R1310 Dysphagia, unspecified: Secondary | ICD-10-CM | POA: Diagnosis present

## 2023-08-27 DIAGNOSIS — Z90710 Acquired absence of both cervix and uterus: Secondary | ICD-10-CM

## 2023-08-27 DIAGNOSIS — R739 Hyperglycemia, unspecified: Secondary | ICD-10-CM | POA: Diagnosis not present

## 2023-08-27 DIAGNOSIS — Z96641 Presence of right artificial hip joint: Secondary | ICD-10-CM | POA: Diagnosis not present

## 2023-08-27 DIAGNOSIS — G8929 Other chronic pain: Secondary | ICD-10-CM | POA: Diagnosis present

## 2023-08-27 DIAGNOSIS — I160 Hypertensive urgency: Secondary | ICD-10-CM | POA: Diagnosis present

## 2023-08-27 DIAGNOSIS — I129 Hypertensive chronic kidney disease with stage 1 through stage 4 chronic kidney disease, or unspecified chronic kidney disease: Secondary | ICD-10-CM | POA: Diagnosis not present

## 2023-08-27 DIAGNOSIS — K219 Gastro-esophageal reflux disease without esophagitis: Secondary | ICD-10-CM | POA: Diagnosis present

## 2023-08-27 DIAGNOSIS — D72829 Elevated white blood cell count, unspecified: Secondary | ICD-10-CM | POA: Diagnosis present

## 2023-08-27 DIAGNOSIS — I10 Essential (primary) hypertension: Secondary | ICD-10-CM | POA: Diagnosis present

## 2023-08-27 DIAGNOSIS — G250 Essential tremor: Secondary | ICD-10-CM | POA: Diagnosis present

## 2023-08-27 DIAGNOSIS — D62 Acute posthemorrhagic anemia: Secondary | ICD-10-CM | POA: Diagnosis not present

## 2023-08-27 DIAGNOSIS — N1832 Chronic kidney disease, stage 3b: Secondary | ICD-10-CM | POA: Diagnosis present

## 2023-08-27 LAB — CBC WITH DIFFERENTIAL/PLATELET
Abs Immature Granulocytes: 0.01 10*3/uL (ref 0.00–0.07)
Basophils Absolute: 0 10*3/uL (ref 0.0–0.1)
Basophils Relative: 0 %
Eosinophils Absolute: 0.2 10*3/uL (ref 0.0–0.5)
Eosinophils Relative: 3 %
HCT: 29.3 % — ABNORMAL LOW (ref 36.0–46.0)
Hemoglobin: 9.8 g/dL — ABNORMAL LOW (ref 12.0–15.0)
Immature Granulocytes: 0 %
Lymphocytes Relative: 16 %
Lymphs Abs: 1 10*3/uL (ref 0.7–4.0)
MCH: 27.9 pg (ref 26.0–34.0)
MCHC: 33.4 g/dL (ref 30.0–36.0)
MCV: 83.5 fL (ref 80.0–100.0)
Monocytes Absolute: 0.5 10*3/uL (ref 0.1–1.0)
Monocytes Relative: 9 %
Neutro Abs: 4.4 10*3/uL (ref 1.7–7.7)
Neutrophils Relative %: 72 %
Platelets: 120 10*3/uL — ABNORMAL LOW (ref 150–400)
RBC: 3.51 MIL/uL — ABNORMAL LOW (ref 3.87–5.11)
RDW: 14.5 % (ref 11.5–15.5)
WBC: 6.2 10*3/uL (ref 4.0–10.5)
nRBC: 0 % (ref 0.0–0.2)

## 2023-08-27 LAB — COMPREHENSIVE METABOLIC PANEL
ALT: 16 U/L (ref 0–44)
AST: 19 U/L (ref 15–41)
Albumin: 3.5 g/dL (ref 3.5–5.0)
Alkaline Phosphatase: 46 U/L (ref 38–126)
Anion gap: 7 (ref 5–15)
BUN: 25 mg/dL — ABNORMAL HIGH (ref 8–23)
CO2: 23 mmol/L (ref 22–32)
Calcium: 8.8 mg/dL — ABNORMAL LOW (ref 8.9–10.3)
Chloride: 108 mmol/L (ref 98–111)
Creatinine, Ser: 1.18 mg/dL — ABNORMAL HIGH (ref 0.44–1.00)
GFR, Estimated: 46 mL/min — ABNORMAL LOW (ref >60)
Glucose, Bld: 120 mg/dL — ABNORMAL HIGH (ref 70–99)
Potassium: 4 mmol/L (ref 3.5–5.1)
Sodium: 138 mmol/L (ref 135–145)
Total Bilirubin: 0.9 mg/dL (ref 0.3–1.2)
Total Protein: 7.6 g/dL (ref 6.5–8.1)

## 2023-08-27 MED ORDER — METOPROLOL TARTRATE 5 MG/5ML IV SOLN
5.0000 mg | Freq: Four times a day (QID) | INTRAVENOUS | Status: DC | PRN
  Administered 2023-08-27 – 2023-08-28 (×3): 5 mg via INTRAVENOUS
  Filled 2023-08-27 (×3): qty 5

## 2023-08-27 MED ORDER — SODIUM CHLORIDE 0.9 % IV SOLN: INTRAVENOUS | Status: DC

## 2023-08-27 MED ORDER — ACETAMINOPHEN 325 MG PO TABS
650.0000 mg | ORAL_TABLET | Freq: Four times a day (QID) | ORAL | Status: DC | PRN
  Administered 2023-09-01 – 2023-09-06 (×3): 650 mg via ORAL
  Filled 2023-08-27 (×5): qty 2

## 2023-08-27 MED ORDER — MIDODRINE HCL 5 MG PO TABS: 5.0000 mg | ORAL_TABLET | Freq: Three times a day (TID) | ORAL | Status: DC

## 2023-08-27 MED ORDER — ONDANSETRON HCL 4 MG/2ML IJ SOLN
4.0000 mg | Freq: Four times a day (QID) | INTRAMUSCULAR | Status: DC | PRN
  Administered 2023-08-28 – 2023-08-29 (×3): 4 mg via INTRAVENOUS
  Filled 2023-08-27 (×2): qty 2

## 2023-08-27 MED ORDER — DOCUSATE SODIUM 100 MG PO CAPS
100.0000 mg | ORAL_CAPSULE | Freq: Two times a day (BID) | ORAL | Status: DC
  Administered 2023-08-27 – 2023-09-06 (×19): 100 mg via ORAL
  Filled 2023-08-27 (×19): qty 1

## 2023-08-27 MED ORDER — METOPROLOL SUCCINATE ER 25 MG PO TB24
12.5000 mg | ORAL_TABLET | Freq: Every day | ORAL | Status: DC
  Administered 2023-08-27 – 2023-09-05 (×10): 12.5 mg via ORAL
  Filled 2023-08-27 (×10): qty 1

## 2023-08-27 MED ORDER — POLYETHYLENE GLYCOL 3350 17 G PO PACK
17.0000 g | PACK | Freq: Every day | ORAL | Status: DC | PRN
  Administered 2023-09-06: 17 g via ORAL
  Filled 2023-08-27: qty 1

## 2023-08-27 MED ORDER — OXYCODONE HCL 5 MG PO TABS: 5.0000 mg | ORAL_TABLET | ORAL | Status: DC | PRN

## 2023-08-27 MED ORDER — MIDODRINE HCL 5 MG PO TABS: 2.5000 mg | ORAL_TABLET | Freq: Two times a day (BID) | ORAL | Status: DC

## 2023-08-27 MED ORDER — SODIUM CHLORIDE 0.9% FLUSH
3.0000 mL | INTRAVENOUS | Status: DC | PRN
  Administered 2023-08-31 – 2023-09-05 (×2): 3 mL via INTRAVENOUS

## 2023-08-27 MED ORDER — HYDROMORPHONE HCL 1 MG/ML IJ SOLN
0.5000 mg | INTRAMUSCULAR | Status: DC | PRN
  Administered 2023-08-28: 0.5 mg via INTRAVENOUS
  Administered 2023-08-28 – 2023-09-01 (×4): 1 mg via INTRAVENOUS
  Filled 2023-08-27 (×7): qty 1

## 2023-08-27 MED ORDER — ONDANSETRON HCL 4 MG PO TABS: 4.0000 mg | ORAL_TABLET | Freq: Four times a day (QID) | ORAL | Status: DC | PRN

## 2023-08-27 MED ORDER — SODIUM CHLORIDE 0.9 % IV SOLN: 250.0000 mL | INTRAVENOUS | Status: DC | PRN

## 2023-08-27 MED ORDER — ACETAMINOPHEN 650 MG RE SUPP: 650.0000 mg | Freq: Four times a day (QID) | RECTAL | Status: DC | PRN

## 2023-08-27 MED ORDER — SODIUM CHLORIDE 0.9% FLUSH
3.0000 mL | Freq: Two times a day (BID) | INTRAVENOUS | Status: DC
  Administered 2023-08-27 – 2023-09-06 (×17): 3 mL via INTRAVENOUS

## 2023-08-27 MED ORDER — FUROSEMIDE 20 MG PO TABS
20.0000 mg | ORAL_TABLET | Freq: Every day | ORAL | Status: DC
  Administered 2023-08-28 – 2023-08-30 (×2): 20 mg via ORAL
  Filled 2023-08-27 (×2): qty 1

## 2023-08-27 MED ORDER — METHOCARBAMOL 1000 MG/10ML IJ SOLN: 500.0000 mg | Freq: Four times a day (QID) | INTRAVENOUS | Status: DC | PRN

## 2023-08-27 NOTE — Consult Note (Signed)
Initial Consultation Note   Patient: Sheri Brown UEA:[id number] DOB: Oct 22, 1936 PCP: [name], PA-C DOA: 08/27/2023 DOS: the patient was seen and examined on 08/27/2023 Primary service: Jodi Geralds, MD  Referring physician: Dr. Luiz Blare Reason for consult: pre-operative medical evaluation  Assessment/Plan: Assessment and Plan:  Pt is a very pleasant elderly female residing in independent living.She has no problems with activities of daily living from a cardiorespiratory stand point. Her arthritis is the major factor that limits her. She had recent cardiac testing with echocardiogram in September with EF 60-65% and no significant structural/valvular abnormalities.  Her [facility] activity status index of 15.45 with METS suggestive of moderate functional capacity.   -Orthopedic/spinal surgery is associated with an intermediate (1-5%) cardiovascular risk for cardiac death and nonfatal MI -With history of diastolic dysfunction and CVA, the revised cardiac index gives a risk estimate of 10.0% of 30 day risk of death, MI, cardiac arrest  -Because of this risk, a pre-operative EKG was obtained which was in normal sinus rhythm with non-specific T wave changes  -Based on these findings, It is reasonable for the patient to go to the OR without additional evaluation.     Autonomic dysfunction with Orthostatic hypotension She has orthostatic hypotension requiring midodrine and daughter reports she is now on a lower dose with BID dosing and I have adjusted medication order to reflect this. Okay to continue her metoprolol and Lasix.   Anemia Stable at her baseline of 9-10. She has hx of sickle cell trait  CKD3b -creatinine is actually more improved than her baseline of around 1.3   TRH will sign off at present, please call us again when needed.   HPI: Sheri Brown is a 87 y.o. female with past medical history of CKD 3b, HLD, PAD, autonomic dysfunction with orthostatic hypotension who is  being admitted by orthopedic team for increase right hip pain and inability to ambulate.  Pt has hx of right total hip repair and was last evaluated by orthopedic about 18 months ago with x-ray demonstrating severe wear of the acetabulum polyethylene liner or dislocation of the liner. She has been without severe pain until recently. Orthopedic is concerned about potential joint infection and has plans to obtain urgent CT imaging and a fluoroscopic guided aspiration before proceeding with total hip revision potentially on Wednesday (10/9). Hospitalist being consulted for pre-operative evaluation.   Pt is a very pleasant elderly female residing in independent living. Her daughter Sheri Brown who is a family physician helps to collaborate history given pt has mild cognitive impairment with current events. Pt at baseline ambulates with a rollator. She has autonomic dysfunction with orthostatic hypotension that is treated with midodrine. Has no hx of falls. She has severe shoulder arthritis and thus has an aid that assist with bathing and dressing but otherwise could do those activities independently if she did not have shoulder issues. She is able to ambulate across a room and flights of stairs without dyspnea or chest pain.   Many years ago she had issue with shortness of breath and underwent a right heart catheterization (07/2016) that was normal with preserved cardiac output and cardiac index.  On 12/2020 had negative myoview stress test.  PFT in 2022 with mild restriction with moderately reduced DLCO  Just last month in September she had repeat TTE with EF of 60-65% with moderate LVH. There is aortic valve sclerosis but no aortic stenosis. Mild aortic regurgitation. Trace mitral regurgitation. Trace tricuspid regurgitation.    Review of Systems: As  mentioned in the history of present illness. All other systems reviewed and are negative. Past Medical History:  Diagnosis Date   Anemia    Arthritis     OA hands   Benign essential tremor 08/24/2017   Complication of anesthesia    "doesnt need alot of anesthesia"   GERD (gastroesophageal reflux disease)    History of hyperlipidemia    Hypertension 03/06/2016   Infected sebaceous cyst    Memory difficulties 08/24/2017   Orthostatic hypotension    PONV (postoperative nausea and vomiting)    Right inguinal hernia 09/07/2017   Sickle cell trait (HCC)    Thrombocytopathia (HCC)    Vitamin D deficiency    Weight loss    Past Surgical History:  Procedure Laterality Date   ABDOMINAL HYSTERECTOMY     BLADDER SUSPENSION     BUNIONECTOMY     CARDIAC CATHETERIZATION N/A 08/08/2016   Procedure: Right Heart Cath;  Surgeon: Yates Decamp, MD;  Location: [facility] INVASIVE CV LAB;  Service: Cardiovascular;  Laterality: N/A;   EYE SURGERY     cataract   INGUINAL HERNIA REPAIR Right 09/07/2017   Procedure: OPEN REPAIR RIGHT INGUINAL HERNIA WITH MESH;  Surgeon: Claud Kelp, MD;  Location: [facility];  Service: General;  Laterality: Right;   INSERTION OF MESH Right 09/07/2017   Procedure: INSERTION OF MESH;  Surgeon: Claud Kelp, MD;  Location: [facility];  Service: General;  Laterality: Right;   JOINT REPLACEMENT Right    Social History:  reports that she has never smoked. She has never used smokeless tobacco. She reports that she does not drink alcohol and does not use drugs.  Allergies  Allergen Reactions   Aricept [Donepezil Hcl]     Pt stated it caused dizziness, nausea   Diazepam Other (See Comments)    Mental disturbance.    Family History  Problem Relation Age of Onset   Hypertension Mother    Cancer Father        bladder    Prior to Admission medications   Medication Sig Start Date End Date Taking? Authorizing Provider  furosemide (LASIX) 20 MG tablet Take 20 mg by mouth daily. 08/13/23  Yes [provider]  metoprolol succinate (TOPROL XL) 25 MG 24 hr tablet Take 1 tablet (25 mg total) by mouth  daily. Patient taking differently: Take 12.5 mg by mouth at bedtime. 11/10/22  Yes Tobb, Kardie, DO  midodrine (PROAMATINE) 2.5 MG tablet Take 5 mg by mouth 3 (three) times daily. 08/08/23  Yes [provider]  Cholecalciferol (VITAMIN D3) 50 MCG (2000 UT) TABS Take 2,000 Units by mouth daily.    [provider]  lidocaine (LIDODERM) 5 % Place 1 patch onto the skin daily as needed (Pain). Remove & Discard patch within 12 hours or as directed by MD    [provider]  Multiple Vitamins-Minerals (MULTIVITAMIN WITH MINERALS) tablet Take 1 tablet by mouth daily. Engineer, site, Historical, MD    Physical Exam: Vitals:   08/27/23 1804 08/27/23 2017  BP: (!) 203/95 (!) 212/99  Pulse: 76 76  Temp: 98.2 F (36.8 C) 98.4 F (36.9 C)  TempSrc: Oral Oral  SpO2: 99% 97%  Weight: 61.2 kg   Height: 5\' 5"  (1.651 m)    Constitutional: NAD, calm, comfortable, elderly female younger than stated age laying in bed Eyes: lids and conjunctivae normal ENMT: Mucous membranes are moist.  Neck: normal, supple Respiratory: clear to auscultation bilaterally, no  wheezing, no crackles. Normal respiratory effort. No accessory muscle use.  Cardiovascular: Regular rate and rhythm, no murmurs / rubs / gallops. No extremity edema. Abdomen: no tenderness, soft, non-distended Musculoskeletal: no clubbing / cyanosis. No joint deformity upper and lower extremities. Good ROM, no contractures. Normal muscle tone.  Skin: no rashes, lesions, ulcers. No induration Neurologic: CN 2-12 grossly intact. Difficulty lifting LE off bed against gravity due to hip pain Psychiatric: Normal judgment and insight. Alert and oriented x to self, place but time did had occasional difficult recalling current event. Normal mood.   Data Reviewed:   See A/P     Family Communication: Discussed with daughter Sheri Brown   Thank you very much for involving [place] in the care of your patient.  Author: Anselm Jungling, DO 08/27/2023 9:06 PM  For on call review [email or web address].

## 2023-08-27 NOTE — H&P (Signed)
A pre op hand p   Chief Complaint: Right hip pain and inability to stand and walk  HPI: Sheri Brown is a 87 y.o. female who presents for evaluation of Right hip pain with inability to stand and walk. It has been present for The last couple of days and has been worsening. She has failed conservative measures. Pain is rated as severe.The patient had a hip replacement put in an outside facility many years ago.  I saw her about 18 months ago with severe back and left hip pain.  She was treated conservatively ultimately ended up doing well.  Right hip began hurting her over the last couple of days and now she cannot move the hip without significant pain and has difficulty standing and walking.  On her evaluation 18 months ago her x-ray showed what it showed today in my office which is severe where of the acetabulum polyethylene liner or dislocation of that liner.She has a daughter who is a family Conservation officer, nature.  Past Medical History:  Diagnosis Date   Anemia    Arthritis    OA hands   Benign essential tremor 08/24/2017   Complication of anesthesia    "doesnt need alot of anesthesia"   GERD (gastroesophageal reflux disease)    History of hyperlipidemia    Hypertension 03/06/2016   Infected sebaceous cyst    Memory difficulties 08/24/2017   Orthostatic hypotension    PONV (postoperative nausea and vomiting)    Right inguinal hernia 09/07/2017   Sickle cell trait (HCC)    Thrombocytopathia (HCC)    Vitamin D deficiency    Weight loss    Past Surgical History:  Procedure Laterality Date   ABDOMINAL HYSTERECTOMY     BLADDER SUSPENSION     BUNIONECTOMY     CARDIAC CATHETERIZATION N/A 08/08/2016   Procedure: Right Heart Cath;  Surgeon: Yates Decamp, MD;  Location: [facility] INVASIVE CV LAB;  Service: Cardiovascular;  Laterality: N/A;   EYE SURGERY     cataract   INGUINAL HERNIA REPAIR Right 09/07/2017   Procedure: OPEN REPAIR RIGHT INGUINAL HERNIA WITH MESH;  Surgeon: Claud Kelp, MD;  Location:  [facility];  Service: General;  Laterality: Right;   INSERTION OF MESH Right 09/07/2017   Procedure: INSERTION OF MESH;  Surgeon: Claud Kelp, MD;  Location: [facility];  Service: General;  Laterality: Right;   JOINT REPLACEMENT Right    Social History   Socioeconomic History   Marital status: Widowed    Spouse name: Not on file   Number of children: 3   Years of education: 16+   Highest education level: Not on file  Occupational History   Not on file  Tobacco Use   Smoking status: Never   Smokeless tobacco: Never  Vaping Use   Vaping status: Never Used  Substance and Sexual Activity   Alcohol use: No   Drug use: No   Sexual activity: Not on file  Other Topics Concern   Not on file  Social History Narrative   Lives alone   Right handed    Caffeine use: none   Social Determinants of Health   Financial Resource Strain: Not on file  Food Insecurity: No Food Insecurity (08/27/2023)   Hunger Vital Sign    Worried About Running Out of Food in the Last Year: Never true    Ran Out of Food in the Last Year: Never true  Transportation Needs: No Transportation Needs (08/27/2023)   PRAPARE - Transportation  Lack of Transportation (Medical): No    Lack of Transportation (Non-Medical): No  Recent Concern: Transportation Needs - Unmet Transportation Needs (08/09/2023)   Received from [employer]    In the past 12 months, has lack of reliable transportation kept you from medical appointments, meetings, work or from getting things needed for daily living? : Yes  Physical Activity: Not on file  Stress: Not on file  Social Connections: Not on file   Family History  Problem Relation Age of Onset   Hypertension Mother    Cancer Father        bladder   Allergies  Allergen Reactions   Aricept [Donepezil Hcl]     Pt stated it caused dizziness, nausea   Diazepam Other (See Comments)    Mental disturbance.   Prior to Admission  medications   Medication Sig Start Date End Date Taking? Authorizing Provider  Cholecalciferol (VITAMIN D3) 50 MCG (2000 UT) TABS Take 2,000 Units by mouth daily.    [provider]  furosemide (LASIX) 20 MG tablet Take 20 mg by mouth daily. 08/13/23   [provider]  lidocaine (LIDODERM) 5 % Place 1 patch onto the skin daily as needed (Pain). Remove & Discard patch within 12 hours or as directed by MD    [provider]  metoprolol succinate (TOPROL XL) 25 MG 24 hr tablet Take 1 tablet (25 mg total) by mouth daily. Patient taking differently: Take 12.5 mg by mouth at bedtime. 11/10/22   Tobb, Kardie, DO  midodrine (PROAMATINE) 2.5 MG tablet Take 5 mg by mouth 3 (three) times daily. 08/08/23   [provider]  Multiple Vitamins-Minerals (MULTIVITAMIN WITH MINERALS) tablet Take 1 tablet by mouth daily. Engineer, site, Historical, MD     Positive ROS: None  All other systems have been reviewed and were otherwise negative with the exception of those mentioned in the HPI and as above.  Physical Exam: Vitals:   08/27/23 1804  BP: (!) 203/95  Pulse: 76  Temp: 98.2 F (36.8 C)  SpO2: 99%    General: Alert, no acute distress Cardiovascular: No pedal edema Respiratory: No cyanosis, no use of accessory musculature GI: No organomegaly, abdomen is soft and non-tender Skin: No lesions in the area of chief complaint Neurologic: Sensation intact distally Psychiatric: Patient is competent for consent with normal mood and affect Lymphatic: No axillary or cervical lymphadenopathy  MUSCULOSKELETAL: Right hip has extreme pain with internal and external rotation.  She has arthritis intact distally.  She has moderate pitting edema bilateral in the lower extremities.  Assessment/Plan: 87 year old female with history of inability to stand and walk on the right leg over the last several days.  She has had long-standing wear of an total hip liner versus  dislocation of the acetabular liner sometime in the past.  She has been standing and walking on this for at least 18 months without severe pain.  She began have worsening pain just recently.  About a month ago she had a significant urinary tract infection but was treated as an outpatient.  She comes in today with inability to stand and walk and severe right hip pain.  I spoke with Dr. Laqueta Linden Today he will be willing to take over her care.  After discussion we felt that CAT scan and fluoroscopic guided aspiration of the right hip would be appropriate.  Given the severe nature of the problem and the patient's inability to take care of herself  in her nursing facility we felt that the appropriate course of action will be admission for urgent CAT scan and a fluoroscopic guided aspiration.  We will try to get those tests done and potentially plan for surgery on Wednesday.  Harvie Junior, MD 08/27/2023 6:41 PM

## 2023-08-28 ENCOUNTER — Other Ambulatory Visit (HOSPITAL_COMMUNITY): Payer: Medicare Other

## 2023-08-28 ENCOUNTER — Encounter (HOSPITAL_COMMUNITY): Payer: Self-pay | Admitting: Orthopedic Surgery

## 2023-08-28 ENCOUNTER — Inpatient Hospital Stay (HOSPITAL_COMMUNITY): Payer: Medicare Other

## 2023-08-28 DIAGNOSIS — M25551 Pain in right hip: Secondary | ICD-10-CM | POA: Diagnosis not present

## 2023-08-28 DIAGNOSIS — G8929 Other chronic pain: Secondary | ICD-10-CM | POA: Diagnosis not present

## 2023-08-28 DIAGNOSIS — Z96641 Presence of right artificial hip joint: Secondary | ICD-10-CM | POA: Diagnosis not present

## 2023-08-28 LAB — C-REACTIVE PROTEIN: CRP: <0.5 mg/dL (ref <1.0)

## 2023-08-28 LAB — SURGICAL PCR SCREEN
MRSA, PCR: NEGATIVE
Staphylococcus aureus: NEGATIVE

## 2023-08-28 LAB — SEDIMENTATION RATE: Sed Rate: 14 mm/h (ref 0–22)

## 2023-08-28 MED ORDER — TRANEXAMIC ACID-NACL 1000-0.7 MG/100ML-% IV SOLN
1000.0000 mg | INTRAVENOUS | Status: AC
  Administered 2023-08-29: 1000 mg via INTRAVENOUS
  Filled 2023-08-28: qty 100

## 2023-08-28 MED ORDER — CHLORHEXIDINE GLUCONATE 4 % EX SOLN
60.0000 mL | Freq: Once | CUTANEOUS | Status: AC
  Administered 2023-08-29: 4 via TOPICAL
  Filled 2023-08-28 (×2): qty 60

## 2023-08-28 MED ORDER — HYDRALAZINE HCL 20 MG/ML IJ SOLN
10.0000 mg | INTRAMUSCULAR | Status: DC | PRN
  Administered 2023-09-04 – 2023-09-05 (×2): 10 mg via INTRAVENOUS
  Filled 2023-08-28 (×3): qty 1

## 2023-08-28 MED ORDER — CEFAZOLIN SODIUM-DEXTROSE 2-4 GM/100ML-% IV SOLN
2.0000 g | INTRAVENOUS | Status: AC
  Administered 2023-08-29: 2 g via INTRAVENOUS
  Filled 2023-08-28: qty 100

## 2023-08-28 MED ORDER — POVIDONE-IODINE 10 % EX SWAB: 2.0000 | Freq: Once | CUTANEOUS | Status: DC

## 2023-08-28 NOTE — Progress Notes (Signed)
Consultation Progress Note   Patient: Sheri Brown MVH:[id number] DOB: 1936-10-19 DOA: 08/27/2023 DOS: the patient was seen and examined on 08/28/2023 Primary service: Jodi Geralds, MD  Brief hospital course: Sheri Brown is an 87 year old female who presented with right hip pain or instability to stand and walk for the last few days secondary to failure of the hardware of her right hip arthroplasty.  She has been admitted by orthopedic surgery for revision..  Assessment and Plan: Principal Problem:   Chronic hip pain after total replacement of right hip joint Continue analgesics as needed. IR to obtain aspiration of the joint to rule out infection. Procedure per orthopedic surgery. Continue analgesics as needed. Continue muscle relaxant as needed. Continue stool softeners and MiraLAX for constipation prevention.  Active Problems:   Hypertension Continue oral metoprolol succinate. Hydralazine 10 mg IVP every 4 hours as needed.    Benign essential tremor Not significant at the moment. Consider primidone if it worsens.    History of hyperlipidemia Currently not on medical therapy.    Thrombocytopenia (HCC) Monitor platelet count.    Stage 3b chronic kidney disease (HCC) Monitor renal function/electrolytes.  TRH will continue to follow the patient.  Subjective: Patient stated that her pain is better but still continues to have mobility issues due to it.  Physical Exam: Vitals:   08/28/23 0834 08/28/23 1022 08/28/23 1222 08/28/23 1322  BP: (!) 219/105 (!) 176/79 (!) 226/108 (!) 183/101  Pulse: 63 64 66 67  Resp: 15  16   Temp: 98.2 F (36.8 C)     TempSrc: Oral     SpO2: 98%  100%   Weight:      Height:       Physical Exam Vitals and nursing note reviewed.  Constitutional:      General: She is awake. She is not in acute distress.    Appearance: Normal appearance. She is normal weight.  HENT:     Head: Normocephalic.     Nose: No rhinorrhea.  Eyes:     General:  No scleral icterus.    Pupils: Pupils are equal, round, and reactive to light.  Neck:     Vascular: No JVD.  Cardiovascular:     Rate and Rhythm: Normal rate and regular rhythm.     Heart sounds: S1 normal and S2 normal.  Pulmonary:     Effort: Pulmonary effort is normal.     Breath sounds: Normal breath sounds. No wheezing, rhonchi or rales.  Abdominal:     General: Bowel sounds are normal.     Palpations: Abdomen is soft.     Tenderness: There is no abdominal tenderness.  Musculoskeletal:     Cervical back: Neck supple.     Right hip: Tenderness present. Decreased range of motion.     Right lower leg: No edema.     Left lower leg: No edema.  Skin:    General: Skin is warm and dry.  Neurological:     General: No focal deficit present.     Mental Status: She is alert and oriented to person, place, and time.  Psychiatric:        Mood and Affect: Mood normal.        Behavior: Behavior normal. Behavior is cooperative.   Data Reviewed:  There are no new results to review at this time.  Family Communication:   Time spent: 20 minutes.  Author: Bobette Mo, MD 08/28/2023 1:47 PM  For on call review [email or web address].

## 2023-08-28 NOTE — Consult Note (Addendum)
ORTHOPAEDIC CONSULTATION  REQUESTING PHYSICIAN: Jodi Geralds, MD  Chief Complaint: Inability to weight-bear, history of right hip replacement  HPI: Sheri Brown is a 87 y.o. female who had right total hip arthroplasty for performed she reports approximately 16 years ago in [place].  She does not remember the name of the surgeon.  She had done well after surgery.  She is an independent ambulator in assisted living.  Couple of days ago has been unable to weight-bear.  He is localizing pain to the right hip but also to the left knee.  She denies distal numbness and tingling.  Denies any new injury or trauma.  Denies any recent illnesses or infections.    Past Medical History:  Diagnosis Date   Anemia    Arthritis    OA hands   Benign essential tremor 08/24/2017   Complication of anesthesia    "doesnt need alot of anesthesia"   GERD (gastroesophageal reflux disease)    History of hyperlipidemia    Hypertension 03/06/2016   Infected sebaceous cyst    Memory difficulties 08/24/2017   Orthostatic hypotension    PONV (postoperative nausea and vomiting)    Right inguinal hernia 09/07/2017   Sickle cell trait (HCC)    Thrombocytopathia (HCC)    Vitamin D deficiency    Weight loss    Past Surgical History:  Procedure Laterality Date   ABDOMINAL HYSTERECTOMY     BLADDER SUSPENSION     BUNIONECTOMY     CARDIAC CATHETERIZATION N/A 08/08/2016   Procedure: Right Heart Cath;  Surgeon: Yates Decamp, MD;  Location: [facility] INVASIVE CV LAB;  Service: Cardiovascular;  Laterality: N/A;   EYE SURGERY     cataract   INGUINAL HERNIA REPAIR Right 09/07/2017   Procedure: OPEN REPAIR RIGHT INGUINAL HERNIA WITH MESH;  Surgeon: Claud Kelp, MD;  Location: [facility];  Service: General;  Laterality: Right;   INSERTION OF MESH Right 09/07/2017   Procedure: INSERTION OF MESH;  Surgeon: Claud Kelp, MD;  Location: [facility];  Service: General;  Laterality: Right;   JOINT  REPLACEMENT Right    Social History   Socioeconomic History   Marital status: Widowed    Spouse name: Not on file   Number of children: 3   Years of education: 16+   Highest education level: Not on file  Occupational History   Not on file  Tobacco Use   Smoking status: Never    Passive exposure: Never   Smokeless tobacco: Never  Vaping Use   Vaping status: Never Used  Substance and Sexual Activity   Alcohol use: No   Drug use: No   Sexual activity: Not on file  Other Topics Concern   Not on file  Social History Narrative   Lives alone   Right handed    Caffeine use: none   Social Determinants of Health   Financial Resource Strain: Not on file  Food Insecurity: No Food Insecurity (08/27/2023)   Hunger Vital Sign    Worried About Running Out of Food in the Last Year: Never true    Ran Out of Food in the Last Year: Never true  Transportation Needs: No Transportation Needs (08/27/2023)   PRAPARE - Administrator, Civil Service (Medical): No    Lack of Transportation (Non-Medical): No  Recent Concern: Transportation Needs - Unmet Transportation Needs (08/09/2023)   Received from [employer]    In the past 12 months, has lack of  reliable transportation kept you from medical appointments, meetings, work or from getting things needed for daily living? : Yes  Physical Activity: Not on file  Stress: Not on file  Social Connections: Not on file   Family History  Problem Relation Age of Onset   Hypertension Mother    Cancer Father        bladder   Allergies  Allergen Reactions   Aricept [Donepezil Hcl]     Pt stated it caused dizziness, nausea   Diazepam Other (See Comments)    Mental disturbance.     Positive ROS: All other systems have been reviewed and were otherwise negative with the exception of those mentioned in the HPI and as above.  Physical Exam: General: Alert, no acute distress Cardiovascular: No pedal edema Respiratory:  No cyanosis, no use of accessory musculature Skin: No lesions in the area of chief complaint Neurologic: Sensation intact distally Psychiatric: Patient is competent for consent with normal mood and affect  MUSCULOSKELETAL:  LLE No traumatic wounds, ecchymosis, or rash  Very tender palpation about the left knee  Pain reproduced with leg logroll.  Mild palpable left knee effusion  Sens DPN, SPN, TN intact  Motor EHL, ext, flex 5/5  DP 2+, PT 2+, No significant edema RLE well-healed old incision on the right lateral side of the hip.  Nontender  Pain reproduced with hip manipulation and logroll  No knee or ankle effusion  Knee stable to varus/ valgus stress  Sens DPN, SPN, TN intact  Motor EHL, ext, flex 5/5  DP 2+, PT 2+, No significant edema   IMAGING: CT scan reviewed demonstrates articulation of the ball on the acetabular cup.  Assessment: Principal Problem:   Chronic hip pain after total replacement of right hip joint  Failure of right total hip arthroplasty  Left knee pain and swelling  Plan: Discussed with the patient that she has failure of her right hip arthroplasty.  The acetabular liner appears either worn down or broken.  Concern for development of metal corrosion as well as instability with this kind of problem.  Recommend revision arthroplasty with either head and liner exchange or removal and revision of the acetabular component if there is damage to the locking mechanism.  Plan for attempted aspiration today by interventional radiology for preoperative workup to rule out possible secondary prosthetic joint infection given the patient's acute worsening of symptoms over the past couple of days.  Also discussed with patient that given her pain and swelling about her left leg and left knee will obtain imaging today including left knee and left hip x-rays.  May consider aspiration of the left knee as well.    Joen Laura, MD  Contact information:   [email or web address]  7am-5pm epic message Dr. Blanchie Dessert, or call office for patient follow up: [phone] After hours and holidays please check [email or web address] for group call information for [facility]

## 2023-08-28 NOTE — Plan of Care (Signed)

## 2023-08-28 NOTE — TOC Initial Note (Signed)
Transition of Care [facility]) - Initial/Assessment Note    Patient Details  Name: Sheri Brown MRN: [id number] Date of Birth: 1936/05/11  [facility] CM/SW Contact:    Otelia Santee, LCSW Phone Number: 08/28/2023, 12:40 PM  Clinical Narrative:                 Pt coming from ILF where she lives alone. Pt is active with [facility] for HHPT/OT. HH orders will need to be placed prior to discharge for resumption of care. No DME needs identified. TOC will continue to follow  Expected Discharge Plan: Home w Home Health Services Barriers to Discharge: Continued Medical Work up   Patient Goals and CMS Choice Patient states their goals for this hospitalization and ongoing recovery are:: To return home CMS Medicare.gov Compare Post Acute Care list provided to:: Patient Choice offered to / list presented to : Patient [place] ownership interest in [facility].provided to::  (NA)    Expected Discharge Plan and Services In-house Referral: Clinical Social Work Discharge Planning Services: NA Post Acute Care Choice: Home Health, Resumption of Svcs/PTA Provider Living arrangements for the past 2 months: Independent Living Facility                 DME Arranged: N/A DME Agency: NA                  Prior Living Arrangements/Services Living arrangements for the past 2 months: Independent Living Facility   Patient language and need for interpreter reviewed:: Yes Do you feel safe going back to the place where you live?: Yes      Need for Family Participation in Patient Care: No (Comment) Care giver support system in place?: Yes (comment) Current home services: DME, Home OT, Home PT Criminal Activity/Legal Involvement Pertinent to Current Situation/Hospitalization: No - Comment as needed  Activities of Daily Living   ADL Screening (condition at time of admission) Independently performs ADLs?: No Does the patient have a NEW difficulty with  bathing/dressing/toileting/self-feeding that is expected to last >3 days?: Yes (Initiates electronic notice to provider for possible OT consult) Does the patient have a NEW difficulty with getting in/out of bed, walking, or climbing stairs that is expected to last >3 days?: Yes (Initiates electronic notice to provider for possible PT consult) Does the patient have a NEW difficulty with communication that is expected to last >3 days?: No Is the patient deaf or have difficulty hearing?: No Does the patient have difficulty seeing, even when wearing glasses/contacts?: No Does the patient have difficulty concentrating, remembering, or making decisions?: No  Permission Sought/Granted   Permission granted to share information with : No              Emotional Assessment Appearance:: Appears stated age Attitude/Demeanor/Rapport: Engaged Affect (typically observed): Pleasant Orientation: : Oriented to Self, Oriented to Place, Oriented to  Time, Oriented to Situation Alcohol / Substance Use: Not Applicable Psych Involvement: No (comment)  Admission diagnosis:  Chronic hip pain after total replacement of right hip joint [M25.551, G89.29, Z96.641] Patient Active Problem List   Diagnosis Date Noted   Chronic hip pain after total replacement of right hip joint 08/27/2023   Stroke-like symptoms 08/15/2022   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   Stage 3b chronic kidney disease (HCC) 11/01/2021   Orthostatic hypotension 03/18/2019   Vitamin D deficiency, unspecified 03/14/2019   Sickle cell trait (HCC) 03/10/2019   Thrombocytopenia (HCC) 03/10/2019   Weight loss 03/10/2019   Infected sebaceous cyst  of skin 02/19/2019   Right inguinal hernia 09/07/2017   Benign essential tremor 08/24/2017   Memory difficulties 08/24/2017   Shortness of breath 08/07/2016   Anemia 03/06/2016   Dizziness 03/06/2016   History of hyperlipidemia 03/06/2016   Hypertension 03/06/2016   PCP:  [name]  PA-C Pharmacy:   [id number] [address]), [place] - [address] [place] ([place]) [place] [address] Phone: [phone] Fax: [phone]     Social Determinants of Health (SDOH) Social History: SDOH Screenings   Food Insecurity: No Food Insecurity (08/27/2023)  Housing: Low Risk  (08/27/2023)  Transportation Needs: No Transportation Needs (08/27/2023)  Recent Concern: Transportation Needs - Unmet Transportation Needs (08/09/2023)   Received from [facility]  Utilities: Not At Risk (08/27/2023)  Tobacco Use: Low Risk  (08/27/2023)   SDOH Interventions:     Readmission Risk Interventions    08/28/2023   12:31 PM  Readmission Risk Prevention Plan  Post Dischage Appt Complete  Medication Screening Complete  Transportation Screening Complete

## 2023-08-29 ENCOUNTER — Inpatient Hospital Stay (HOSPITAL_COMMUNITY): Payer: Medicare Other

## 2023-08-29 ENCOUNTER — Inpatient Hospital Stay (HOSPITAL_COMMUNITY): Admission: RE | Admit: 2023-08-29 | Payer: Medicare Other | Source: Home / Self Care | Admitting: Orthopedic Surgery

## 2023-08-29 ENCOUNTER — Inpatient Hospital Stay (HOSPITAL_COMMUNITY): Payer: Medicare Other | Admitting: Certified Registered Nurse Anesthetist

## 2023-08-29 ENCOUNTER — Other Ambulatory Visit: Payer: Self-pay

## 2023-08-29 ENCOUNTER — Encounter (HOSPITAL_COMMUNITY): Payer: Self-pay | Admitting: Orthopedic Surgery

## 2023-08-29 ENCOUNTER — Encounter (HOSPITAL_COMMUNITY)
Disposition: A | Discharge status: Skilled Nursing Facility | Payer: Self-pay | Source: Home / Self Care | Attending: Orthopedic Surgery

## 2023-08-29 DIAGNOSIS — N189 Chronic kidney disease, unspecified: Secondary | ICD-10-CM | POA: Diagnosis not present

## 2023-08-29 DIAGNOSIS — G8929 Other chronic pain: Secondary | ICD-10-CM | POA: Diagnosis not present

## 2023-08-29 DIAGNOSIS — M25551 Pain in right hip: Secondary | ICD-10-CM | POA: Diagnosis not present

## 2023-08-29 DIAGNOSIS — T84090A Other mechanical complication of internal right hip prosthesis, initial encounter: Secondary | ICD-10-CM | POA: Diagnosis not present

## 2023-08-29 DIAGNOSIS — Z96641 Presence of right artificial hip joint: Secondary | ICD-10-CM | POA: Diagnosis not present

## 2023-08-29 DIAGNOSIS — I129 Hypertensive chronic kidney disease with stage 1 through stage 4 chronic kidney disease, or unspecified chronic kidney disease: Secondary | ICD-10-CM

## 2023-08-29 HISTORY — PX: TOTAL HIP REVISION: SHX763

## 2023-08-29 LAB — POCT I-STAT, CHEM 8
BUN: 17 mg/dL (ref 8–23)
Calcium, Ion: 1.26 mmol/L (ref 1.15–1.40)
Chloride: 104 mmol/L (ref 98–111)
Creatinine, Ser: 1.1 mg/dL — ABNORMAL HIGH (ref 0.44–1.00)
Glucose, Bld: 89 mg/dL (ref 70–99)
HCT: 37 % (ref 36.0–46.0)
Hemoglobin: 12.6 g/dL (ref 12.0–15.0)
Potassium: 4.3 mmol/L (ref 3.5–5.1)
Sodium: 140 mmol/L (ref 135–145)
TCO2: 25 mmol/L (ref 22–32)

## 2023-08-29 LAB — ABO/RH: ABO/RH(D): O POS

## 2023-08-29 LAB — PREPARE RBC (CROSSMATCH)

## 2023-08-29 SURGERY — TOTAL HIP REVISION
Anesthesia: Spinal | Site: Hip | Laterality: Right

## 2023-08-29 MED ORDER — BUPIVACAINE IN DEXTROSE 0.75-8.25 % IT SOLN
INTRATHECAL | Status: DC | PRN
  Administered 2023-08-29: 1.6 mL via INTRATHECAL

## 2023-08-29 MED ORDER — PHENYLEPHRINE 80 MCG/ML (10ML) SYRINGE FOR IV PUSH (FOR BLOOD PRESSURE SUPPORT)
PREFILLED_SYRINGE | INTRAVENOUS | Status: AC
  Filled 2023-08-29: qty 10

## 2023-08-29 MED ORDER — ISOPROPYL ALCOHOL 70 % SOLN
Status: DC | PRN
  Administered 2023-08-29: 1 via TOPICAL

## 2023-08-29 MED ORDER — PHENYLEPHRINE 80 MCG/ML (10ML) SYRINGE FOR IV PUSH (FOR BLOOD PRESSURE SUPPORT)
PREFILLED_SYRINGE | INTRAVENOUS | Status: DC | PRN
  Administered 2023-08-29: 80 ug via INTRAVENOUS

## 2023-08-29 MED ORDER — BUPIVACAINE LIPOSOME 1.3 % IJ SUSP
INTRAMUSCULAR | Status: DC | PRN
  Administered 2023-08-29: 20 mL

## 2023-08-29 MED ORDER — PHENYLEPHRINE HCL-NACL 20-0.9 MG/250ML-% IV SOLN
INTRAVENOUS | Status: DC | PRN
  Administered 2023-08-29: 30 ug/min via INTRAVENOUS

## 2023-08-29 MED ORDER — ISOPROPYL ALCOHOL 70 % SOLN
Status: AC
  Filled 2023-08-29: qty 480

## 2023-08-29 MED ORDER — PROPOFOL 1000 MG/100ML IV EMUL
INTRAVENOUS | Status: AC
  Filled 2023-08-29: qty 100

## 2023-08-29 MED ORDER — LACTATED RINGERS IV SOLN: INTRAVENOUS | Status: DC

## 2023-08-29 MED ORDER — SODIUM CHLORIDE (PF) 0.9 % IJ SOLN
INTRAMUSCULAR | Status: AC
  Filled 2023-08-29: qty 50

## 2023-08-29 MED ORDER — FENTANYL CITRATE PF 50 MCG/ML IJ SOSY
PREFILLED_SYRINGE | INTRAMUSCULAR | Status: AC
  Filled 2023-08-29: qty 3

## 2023-08-29 MED ORDER — SURGIRINSE WOUND IRRIGATION SYSTEM - OPTIME
TOPICAL | Status: DC | PRN
  Administered 2023-08-29: 450 mL via TOPICAL

## 2023-08-29 MED ORDER — EPHEDRINE 5 MG/ML INJ
INTRAVENOUS | Status: AC
  Filled 2023-08-29: qty 5

## 2023-08-29 MED ORDER — 0.9 % SODIUM CHLORIDE (POUR BTL) OPTIME
TOPICAL | Status: DC | PRN
  Administered 2023-08-29: 1000 mL

## 2023-08-29 MED ORDER — WATER FOR IRRIGATION, STERILE IR SOLN
Status: DC | PRN
  Administered 2023-08-29: 2000 mL

## 2023-08-29 MED ORDER — ACETAMINOPHEN 10 MG/ML IV SOLN
INTRAVENOUS | Status: AC
  Filled 2023-08-29: qty 100

## 2023-08-29 MED ORDER — DROPERIDOL 2.5 MG/ML IJ SOLN: 0.6250 mg | Freq: Once | INTRAMUSCULAR | Status: DC | PRN

## 2023-08-29 MED ORDER — SODIUM CHLORIDE (PF) 0.9 % IJ SOLN
INTRAMUSCULAR | Status: DC | PRN
  Administered 2023-08-29: 60 mL

## 2023-08-29 MED ORDER — SODIUM CHLORIDE 0.9 % IV SOLN
INTRAVENOUS | Status: DC | PRN
Start: 2023-08-29 — End: 2023-08-29

## 2023-08-29 MED ORDER — ACETAMINOPHEN 10 MG/ML IV SOLN
1000.0000 mg | Freq: Once | INTRAVENOUS | Status: DC | PRN
  Administered 2023-08-29: 1000 mg via INTRAVENOUS

## 2023-08-29 MED ORDER — EPHEDRINE SULFATE-NACL 50-0.9 MG/10ML-% IV SOSY
PREFILLED_SYRINGE | INTRAVENOUS | Status: DC | PRN
  Administered 2023-08-29: 5 mg via INTRAVENOUS

## 2023-08-29 MED ORDER — SODIUM CHLORIDE 0.9 % IV SOLN
10.0000 mL/h | Freq: Once | INTRAVENOUS | Status: AC
  Administered 2023-08-29: 10 mL/h via INTRAVENOUS

## 2023-08-29 MED ORDER — OXYCODONE HCL 5 MG PO TABS
5.0000 mg | ORAL_TABLET | ORAL | Status: DC | PRN
  Administered 2023-08-30 – 2023-09-04 (×9): 5 mg via ORAL
  Filled 2023-08-29 (×8): qty 1
  Filled 2023-08-29: qty 2
  Filled 2023-08-29 (×2): qty 1

## 2023-08-29 MED ORDER — PHENYLEPHRINE HCL-NACL 20-0.9 MG/250ML-% IV SOLN
0.0000 ug/min | INTRAVENOUS | Status: DC
  Administered 2023-08-29: 50 ug/min via INTRAVENOUS

## 2023-08-29 MED ORDER — DEXAMETHASONE SODIUM PHOSPHATE 10 MG/ML IJ SOLN
INTRAMUSCULAR | Status: DC | PRN
  Administered 2023-08-29: 5 mg via INTRAVENOUS

## 2023-08-29 MED ORDER — SODIUM CHLORIDE 0.9 % IR SOLN
Status: DC | PRN
  Administered 2023-08-29: 3000 mL
  Administered 2023-08-29: 1000 mL

## 2023-08-29 MED ORDER — PROPOFOL 500 MG/50ML IV EMUL
INTRAVENOUS | Status: DC | PRN
  Administered 2023-08-29: 75 ug/kg/min via INTRAVENOUS

## 2023-08-29 MED ORDER — FENTANYL CITRATE (PF) 100 MCG/2ML IJ SOLN
INTRAMUSCULAR | Status: AC
  Filled 2023-08-29: qty 2

## 2023-08-29 MED ORDER — MIDAZOLAM HCL 2 MG/2ML IJ SOLN
INTRAMUSCULAR | Status: AC
  Filled 2023-08-29: qty 2

## 2023-08-29 MED ORDER — SODIUM CHLORIDE (PF) 0.9 % IJ SOLN
INTRAMUSCULAR | Status: AC
  Filled 2023-08-29: qty 10

## 2023-08-29 MED ORDER — CEFADROXIL 500 MG PO CAPS
500.0000 mg | ORAL_CAPSULE | Freq: Two times a day (BID) | ORAL | Status: DC
  Administered 2023-09-01 – 2023-09-06 (×10): 500 mg via ORAL
  Filled 2023-08-29 (×11): qty 1

## 2023-08-29 MED ORDER — PHENYLEPHRINE HCL-NACL 20-0.9 MG/250ML-% IV SOLN
INTRAVENOUS | Status: AC
  Administered 2023-08-29: 40 ug/min via INTRAVENOUS
  Filled 2023-08-29: qty 250

## 2023-08-29 MED ORDER — BUPIVACAINE LIPOSOME 1.3 % IJ SUSP
INTRAMUSCULAR | Status: AC
  Filled 2023-08-29: qty 20

## 2023-08-29 MED ORDER — FENTANYL CITRATE (PF) 100 MCG/2ML IJ SOLN
INTRAMUSCULAR | Status: DC | PRN
  Administered 2023-08-29 (×2): 50 ug via INTRAVENOUS

## 2023-08-29 MED ORDER — SODIUM CHLORIDE 0.9 % IV SOLN
250.0000 mL | INTRAVENOUS | Status: DC
  Administered 2023-08-29: 250 mL via INTRAVENOUS

## 2023-08-29 MED ORDER — MIDAZOLAM HCL 2 MG/2ML IJ SOLN
INTRAMUSCULAR | Status: DC | PRN
Start: 2023-08-29 — End: 2023-08-29
  Administered 2023-08-29: 2 mg via INTRAVENOUS

## 2023-08-29 MED ORDER — CHLORHEXIDINE GLUCONATE 0.12 % MT SOLN
15.0000 mL | Freq: Once | OROMUCOSAL | Status: AC
  Administered 2023-08-29: 15 mL via OROMUCOSAL

## 2023-08-29 MED ORDER — FENTANYL CITRATE PF 50 MCG/ML IJ SOSY
25.0000 ug | PREFILLED_SYRINGE | INTRAMUSCULAR | Status: DC | PRN
  Administered 2023-08-29 (×3): 50 ug via INTRAVENOUS

## 2023-08-29 MED ORDER — PHENYLEPHRINE HCL-NACL 20-0.9 MG/250ML-% IV SOLN
25.0000 ug/min | INTRAVENOUS | Status: DC
  Administered 2023-08-29: 40 ug/min via INTRAVENOUS

## 2023-08-29 MED ORDER — CEFAZOLIN SODIUM-DEXTROSE 2-4 GM/100ML-% IV SOLN
2.0000 g | Freq: Three times a day (TID) | INTRAVENOUS | Status: AC
  Administered 2023-08-29 – 2023-09-01 (×9): 2 g via INTRAVENOUS
  Filled 2023-08-29 (×9): qty 100

## 2023-08-29 SURGICAL SUPPLY — 85 items
ADH SKN CLS APL DERMABOND .7 (GAUZE/BANDAGES/DRESSINGS) ×1
APL PRP STRL LF DISP 70% ISPRP (MISCELLANEOUS) ×2
BAG COUNTER SPONGE SURGICOUNT (BAG) IMPLANT
BAG DECANTER FOR FLEXI CONT (MISCELLANEOUS) ×1 IMPLANT
BAG SPEC THK2 15X12 ZIP CLS (MISCELLANEOUS) ×1
BAG SPNG CNTER NS LX DISP (BAG)
BAG ZIPLOCK 12X15 (MISCELLANEOUS) ×1 IMPLANT
BLADE SAW SAG 25X90X1.19 (BLADE) IMPLANT
BLADE SAW SGTL 81X20 HD (BLADE) ×1 IMPLANT
CANISTER WOUND CARE 500ML ATS (WOUND CARE) IMPLANT
CHLORAPREP W/TINT 26 (MISCELLANEOUS) ×2 IMPLANT
CNTNR URN SCR LID CUP LEK RST (MISCELLANEOUS) IMPLANT
CONT SPEC 4OZ STRL OR WHT (MISCELLANEOUS) ×4
COVER SURGICAL LIGHT HANDLE (MISCELLANEOUS) ×1 IMPLANT
CUP ACETAB 56MM (Orthopedic Implant) IMPLANT
DERMABOND ADVANCED .7 DNX12 (GAUZE/BANDAGES/DRESSINGS) ×1 IMPLANT
DRAPE 3/4 80X56 (DRAPES) ×2 IMPLANT
DRAPE C-ARM 42X120 X-RAY (DRAPES) ×1 IMPLANT
DRAPE HIP W/POCKET STRL (MISCELLANEOUS) ×1 IMPLANT
DRAPE INCISE IOBAN 66X45 STRL (DRAPES) ×1 IMPLANT
DRAPE INCISE IOBAN 85X60 (DRAPES) ×1 IMPLANT
DRAPE POUCH INSTRU U-SHP 10X18 (DRAPES) ×1 IMPLANT
DRAPE SHEET LG 3/4 BI-LAMINATE (DRAPES) IMPLANT
DRAPE U-SHAPE 47X51 STRL (DRAPES) ×1 IMPLANT
DRESSING AQUACEL AG SP 3.5X10 (GAUZE/BANDAGES/DRESSINGS) ×1 IMPLANT
DRESSING PEEL AND PLAC PRVNA20 (GAUZE/BANDAGES/DRESSINGS) IMPLANT
DRSG AQUACEL AG SP 3.5X10 (GAUZE/BANDAGES/DRESSINGS) ×1
DRSG PEEL AND PLACE PREVENA 20 (GAUZE/BANDAGES/DRESSINGS) ×1
ELECT BLADE TIP CTD 4 INCH (ELECTRODE) ×1 IMPLANT
ELECT NDL TIP 2.8 STRL (NEEDLE) ×1 IMPLANT
ELECT NEEDLE TIP 2.8 STRL (NEEDLE) ×1 IMPLANT
ELECT REM PT RETURN 15FT ADLT (MISCELLANEOUS) ×1 IMPLANT
FACESHIELD WRAPAROUND (MASK) ×1 IMPLANT
FACESHIELD WRAPAROUND OR TEAM (MASK) IMPLANT
GLOVE BIO SURGEON STRL SZ 6.5 (GLOVE) ×2 IMPLANT
GLOVE BIOGEL PI IND STRL 6.5 (GLOVE) ×1 IMPLANT
GLOVE BIOGEL PI IND STRL 8 (GLOVE) ×1 IMPLANT
GLOVE SURG ORTHO 8.0 STRL STRW (GLOVE) ×2 IMPLANT
GOWN STRL REUS W/ TWL XL LVL3 (GOWN DISPOSABLE) ×1 IMPLANT
GOWN STRL REUS W/TWL XL LVL3 (GOWN DISPOSABLE) ×1
HANDPIECE INTERPULSE COAX TIP (DISPOSABLE)
HEAD SROM 36MM PLUS9 (Hips) IMPLANT
HOLDER FOLEY CATH W/STRAP (MISCELLANEOUS) ×1 IMPLANT
HOOD PEEL AWAY T7 (MISCELLANEOUS) ×3 IMPLANT
JET LAVAGE IRRISEPT WOUND (IRRIGATION / IRRIGATOR)
KIT BASIN OR (CUSTOM PROCEDURE TRAY) ×1 IMPLANT
KIT TURNOVER KIT A (KITS) IMPLANT
LAVAGE JET IRRISEPT WOUND (IRRIGATION / IRRIGATOR) IMPLANT
MANIFOLD NEPTUNE II (INSTRUMENTS) ×1 IMPLANT
MARKER SKIN DUAL TIP RULER LAB (MISCELLANEOUS) ×1 IMPLANT
NDL HYPO 22X1.5 SAFETY MO (MISCELLANEOUS) IMPLANT
NEEDLE HYPO 22X1.5 SAFETY MO (MISCELLANEOUS) IMPLANT
NS IRRIG 1000ML POUR BTL (IV SOLUTION) ×1 IMPLANT
PACK TOTAL JOINT (CUSTOM PROCEDURE TRAY) ×1 IMPLANT
PINNACLE ALTRX PLUS 4 N 36X56 (Hips) IMPLANT
PROTECTOR NERVE ULNAR (MISCELLANEOUS) ×1 IMPLANT
RETRIEVER SUT HEWSON (MISCELLANEOUS) ×1 IMPLANT
SCREW 6.5MMX25MM (Screw) IMPLANT
SCREW 6.5MMX35MM (Screw) IMPLANT
SCREW PINN CAN BONE 6.5MMX15MM (Screw) IMPLANT
SEALER BIPOLAR AQUA 6.0 (INSTRUMENTS) ×1 IMPLANT
SET HNDPC FAN SPRY TIP SCT (DISPOSABLE) IMPLANT
SLEEVE SCD COMPRESS KNEE MED (STOCKING) IMPLANT
SOLUTION IRRIG SURGIPHOR (IV SOLUTION) ×1 IMPLANT
SPIKE FLUID TRANSFER (MISCELLANEOUS) ×3 IMPLANT
SPONGE T-LAP 18X18 ~~LOC~~+RFID (SPONGE) IMPLANT
SROM HEAD 36MM PLUS9 (Hips) ×1 IMPLANT
SUCTION TUBE FRAZIER 12FR DISP (SUCTIONS) ×1 IMPLANT
SUT BONE WAX W31G (SUTURE) ×1 IMPLANT
SUT ETHIBOND #5 BRAIDED 30INL (SUTURE) ×1 IMPLANT
SUT ETHILON 3 0 PS 1 (SUTURE) IMPLANT
SUT MNCRL AB 3-0 PS2 18 (SUTURE) ×1 IMPLANT
SUT STRATAFIX 0 PDS 27 VIOLET (SUTURE) ×1
SUT STRATAFIX PDO 1 14 VIOLET (SUTURE) ×1
SUT STRATFX PDO 1 14 VIOLET (SUTURE) ×1
SUT VIC AB 2-0 CT2 27 (SUTURE) ×2 IMPLANT
SUTURE STRATFX 0 PDS 27 VIOLET (SUTURE) ×1 IMPLANT
SUTURE STRATFX PDO 1 14 VIOLET (SUTURE) ×1 IMPLANT
SYR 20ML LL LF (SYRINGE) ×2 IMPLANT
TOWEL OR 17X26 10 PK STRL BLUE (TOWEL DISPOSABLE) ×1 IMPLANT
TRAY FOLEY MTR SLVR 14FR STAT (SET/KITS/TRAYS/PACK) IMPLANT
TRAY FOLEY MTR SLVR 16FR STAT (SET/KITS/TRAYS/PACK) ×1 IMPLANT
TUBE SUCTION HIGH CAP CLEAR NV (SUCTIONS) ×1 IMPLANT
UNDERPAD 30X36 HEAVY ABSORB (UNDERPADS AND DIAPERS) ×1 IMPLANT
WATER STERILE IRR 1000ML POUR (IV SOLUTION) ×2 IMPLANT

## 2023-08-29 NOTE — Anesthesia Postprocedure Evaluation (Signed)
Anesthesia Post Note  Patient: Sheri Brown  Procedure(s) Performed: TOTAL HIP REVISION (Right: Hip)     Patient location during evaluation: PACU Anesthesia Type: Spinal Level of consciousness: oriented and awake and alert Pain management: pain level controlled Vital Signs Assessment: post-procedure vital signs reviewed and stable Respiratory status: spontaneous breathing, respiratory function stable and patient connected to nasal cannula oxygen Cardiovascular status: blood pressure returned to baseline and stable Postop Assessment: no headache, no backache and no apparent nausea or vomiting Anesthetic complications: no   No notable events documented.  Last Vitals:  Vitals:   08/29/23 1530 08/29/23 1545  BP: 134/79 (!) 142/88  Pulse: 74 72  Resp: 14 12  Temp:    SpO2: 92% 96%    Last Pain:  Vitals:   08/29/23 1027  TempSrc:   PainSc: 0-No pain                 Richey Nation

## 2023-08-29 NOTE — Discharge Instructions (Addendum)
INSTRUCTIONS AFTER JOINT REPLACEMENT   Remove items at home which could result in a fall. This includes throw rugs or furniture in walking pathways ICE to the affected joint every three hours while awake for 30 minutes at a time, for at least the first 3-5 days, and then as needed for pain and swelling.  Continue to use ice for pain and swelling. You may notice swelling that will progress down to the foot and ankle.  This is normal after surgery.  Elevate your leg when you are not up walking on it.   Continue to use the breathing machine you got in the hospital (incentive spirometer) which will help keep your temperature down.  It is common for your temperature to cycle up and down following surgery, especially at night when you are not up moving around and exerting yourself.  The breathing machine keeps your lungs expanded and your temperature down.  DIET:  As you were doing prior to hospitalization, we recommend a well-balanced diet.  DRESSING / WOUND CARE / SHOWERING:  Keep the surgical dressing until follow up.  The dressing is water-resistant, please keep this dry when showering.  Do not submerge the dressing.  IF THE DRESSING FALLS OFF or the wound gets wet inside, change the dressing with sterile gauze.  Please use good hand washing techniques before changing the dressing.  Do not use any lotions or creams on the incision until instructed by your surgeon.    ACTIVITY  Increase activity slowly as tolerated, but follow the weight bearing instructions below.   No driving for 6 weeks or until further direction given by your physician.  You cannot drive while taking narcotics.  No lifting or carrying greater than 10 lbs. until further directed by your surgeon. Avoid periods of inactivity such as sitting longer than an hour when not asleep. This helps prevent blood clots.  You may return to work once you are authorized by your doctor.   WEIGHT BEARING: Weight bearing as tolerated with assist  device (walker, cane, etc) as directed, use it as long as suggested by your surgeon or therapist, typically at least 4-6 weeks.  EXERCISES  Results after joint replacement surgery are often greatly improved when you follow the exercise, range of motion and muscle strengthening exercises prescribed by your doctor. Safety measures are also important to protect the joint from further injury. Any time any of these exercises cause you to have increased pain or swelling, decrease what you are doing until you are comfortable again and then slowly increase them. If you have problems or questions, call your caregiver or physical therapist for advice.   Rehabilitation is important following a joint replacement. After just a few days of immobilization, the muscles of the leg can become weakened and shrink (atrophy).  These exercises are designed to build up the tone and strength of the thigh and leg muscles and to improve motion. Often times heat used for twenty to thirty minutes before working out will loosen up your tissues and help with improving the range of motion but do not use heat for the first two weeks following surgery (sometimes heat can increase post-operative swelling).   These exercises can be done on a training (exercise) mat, on the floor, on a table or on a bed. Use whatever works the best and is most comfortable for you.    Use music or television while you are exercising so that the exercises are a pleasant break in your day. This will  make your life better with the exercises acting as a break in your routine that you can look forward to.   Perform all exercises about fifteen times, three times per day or as directed.  You should exercise both the operative leg and the other leg as well.  Exercises include:   Quad Sets - Tighten up the muscle on the front of the thigh (Quad) and hold for 5-10 seconds.   Straight Leg Raises - With your knee straight (if you were given a brace, keep it on), lift  the leg to 60 degrees, hold for 3 seconds, and slowly lower the leg.  Perform this exercise against resistance later as your leg gets stronger.  Leg Slides: Lying on your back, slowly slide your foot toward your buttocks, bending your knee up off the floor (only go as far as is comfortable). Then slowly slide your foot back down until your leg is flat on the floor again.  Angel Wings: Lying on your back spread your legs to the side as far apart as you can without causing discomfort.  Hamstring Strength:  Lying on your back, push your heel against the floor with your leg straight by tightening up the muscles of your buttocks.  Repeat, but this time bend your knee to a comfortable angle, and push your heel against the floor.  You may put a pillow under the heel to make it more comfortable if necessary.   A rehabilitation program following joint replacement surgery can speed recovery and prevent re-injury in the future due to weakened muscles. Contact your doctor or a physical therapist for more information on knee rehabilitation.   CONSTIPATION:  Constipation is defined medically as fewer than three stools per week and severe constipation as less than one stool per week.  Even if you have a regular bowel pattern at home, your normal regimen is likely to be disrupted due to multiple reasons following surgery.  Combination of anesthesia, postoperative narcotics, change in appetite and fluid intake all can affect your bowels.   YOU MUST use at least one of the following options; they are listed in order of increasing strength to get the job done.  They are all available over the counter, and you may need to use some, POSSIBLY even all of these options:    Drink plenty of fluids (prune juice may be helpful) and high fiber foods Colace 100 mg by mouth twice a day  Senokot for constipation as directed and as needed Dulcolax (bisacodyl), take with full glass of water  Miralax (polyethylene glycol) once or twice  a day as needed.  If you have tried all these things and are unable to have a bowel movement in the first 3-4 days after surgery call either your surgeon or your primary doctor.    If you experience loose stools or diarrhea, hold the medications until you stool forms back up.  If your symptoms do not get better within 1 week or if they get worse, check with your doctor.  If you experience "the worst abdominal pain ever" or develop nausea or vomiting, please contact the office immediately for further recommendations for treatment.  ITCHING:  If you experience itching with your medications, try taking only a single pain pill, or even half a pain pill at a time.  You can also use Benadryl over the counter for itching or also to help with sleep.   TED HOSE STOCKINGS:  Use stockings on both legs until for at least  2 weeks or as directed by physician office. They may be removed at night for sleeping.  MEDICATIONS:  See your medication summary on the "After Visit Summary" that nursing will review with you.  You may have some home medications which will be placed on hold until you complete the course of blood thinner medication.  It is important for you to complete the blood thinner medication as prescribed.  Blood clot prevention (DVT Prophylaxis): After surgery you are at an increased risk for a blood clot. you were prescribed a blood thinner, Aspirin 81mg  XXX, to be taken twice daily for a total of 4 weeks from surgery to help reduce your risk of getting a blood clot.  Signs of a pulmonary embolus (blood clot in the lungs) include sudden short of breath, feeling lightheaded or dizzy, chest pain with a deep breath, rapid pulse rapid breathing.  Signs of a blood clot in your arms or legs include new unexplained swelling and cramping, warm, red or darkened skin around the painful area.  Please call the office or 911 right away if these signs or symptoms develop.  PRECAUTIONS:   If you experience chest pain or  shortness of breath - call 911 immediately for transfer to the hospital emergency department.   If you develop a fever greater that 101 F, purulent drainage from wound, increased redness or drainage from wound, foul odor from the wound/dressing, or calf pain - CONTACT YOUR SURGEON.                                                   FOLLOW-UP APPOINTMENTS:  If you do not already have a post-op appointment, please call the office for an appointment to be seen by your surgeon.  Guidelines for how soon to be seen are listed in your "After Visit Summary", but are typically between 2-3 weeks after surgery.  If you have a specialized bandage, you may be told to follow up 1 week after surgery.  POST-OPERATIVE OPIOID TAPER INSTRUCTIONS: It is important to wean off of your opioid medication as soon as possible. If you do not need pain medication after your surgery it is ok to stop day one. Opioids include: Codeine, Hydrocodone(Norco, Vicodin), Oxycodone(Percocet, oxycontin) and hydromorphone amongst others.  Long term and even short term use of opiods can cause: Increased pain response Dependence Constipation Depression Respiratory depression And more.  Withdrawal symptoms can include Flu like symptoms Nausea, vomiting And more Techniques to manage these symptoms Hydrate well Eat regular healthy meals Stay active Use relaxation techniques(deep breathing, meditating, yoga) Do Not substitute Alcohol to help with tapering If you have been on opioids for less than two weeks and do not have pain than it is ok to stop all together.  Plan to wean off of opioids This plan should start within one week post op of your joint replacement. Maintain the same interval or time between taking each dose and first decrease the dose.  Cut the total daily intake of opioids by one tablet each day Next start to increase the time between doses. The last dose that should be eliminated is the evening dose.   MAKE SURE  YOU:  Understand these instructions.  Get help right away if you are not doing well or get worse.    Thank you for letting us be a part of  your medical care team.  It is a privilege we respect greatly.  We hope these instructions will help you stay on track for a fast and full recovery!

## 2023-08-29 NOTE — Interval H&P Note (Signed)
The patient has been re-examined, and the chart reviewed, and there have been no interval changes to the documented history and physical.    Plan for Revision right hip arthroplasty, possible head liner exchange versus cup and stem revision  The operative side was examined and the patient was confirmed to have sensation to DPN, SPN, TN intact, Motor EHL, ext, flex 5/5, and DP 2+, PT 2+, No significant edema.   The risks, benefits, and alternatives have been discussed at length with patient, and the patient is willing to proceed.  Right hip marked. Consent has been signed.

## 2023-08-29 NOTE — Op Note (Addendum)
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  PREOPERATIVE INDICATIONS:    Sheri Brown is an 87 y.o. female who had undergone right total hip arthroplasty in [place] approximately 14 years ago.  She did well after surgery.  However more recently having acute pain in the right hip.  X-rays obtained by Dr. Luiz Blare in this clinic demonstrated articulation of the head ball on the socket concerning for failure of the liner and wear of the acetabular cup.  Given the significant pain disability she was having and failure of hip replacement component revision was recommended.  The risks benefits and alternatives were discussed with the patient including but not limited to the risks of nonoperative treatment, versus surgical intervention including infection, bleeding, nerve injury, periprosthetic fracture, the need for revision surgery, dislocation, leg length discrepancy, blood clots, cardiopulmonary complications, morbidity, mortality, among others, and they were willing to proceed.     OPERATIVE REPORT     SURGEON:  Weber Cooks, MD    ASSISTANT:  Kathie Dike, PA-C, (Present throughout the entire procedure,  necessary for completion of procedure in a timely manner, assisting with retraction, instrumentation, and closure)     ANESTHESIA:  spinal  ESTIMATED BLOOD LOSS: 600cc    COMPLICATIONS:  None.     UNIQUE ASPECTS OF THE CASE: Wear and breakage of the acetabular liner which was dislodged.  There was a S articulation of the ceramic head ball on the metal cup which should  cause severe wear and damage to the acetabular cup and there was significant corrosion in the soft tissues however without damage to the abductors or other muscular structures  COMPONENTS:   56 mm multihole acetabular shell, 6.5 hex screws x 3, +4offset highly cross-linked poly, 36+9 mm cobalt chrome head with 11/13 taper Implant Name Type Inv. Item Serial No. Manufacturer Lot No. LRB No. Used Action  CUP ACETAB - [id number] Orthopedic Implant CUP ACETAB  [employer] [id number] Right 1 Implanted  SCREW PINN CAN BONE 6.1HYQ65HQ - [id number] Screw SCREW PINN CAN BONE 6.5MMX15MM  [employer]PU[employer] [id number] Right 1 Implanted  SCREW 6.5MMX35MM - [id number] Screw SCREW 6.5MMX35MM  DEPU[employer] [id number] Right 1 Implanted  SCREW 6.5MMX25MM - [id number] Screw SCREW 6.5MMX25MM  [employer]PU[employer] [id number] Right 1 Implanted  SROM HEAD PLUS9 - [id number] Hips SROM HEAD PLUS9  [employer]PU[employer] [id number] Right 1 Implanted  PINNACLE ALTRX PLUS 4 N 36X56 - [id number] Hips PINNACLE ALTRX PLUS 4 N 36X56  [employer] [id number] Right 1 Implanted    The aquamantis was utilized for this case to help facilitate better hemostasis as patient was felt to be at increased risk of bleeding because of complex case requiring increased OR time and/or exposure.        PROCEDURE IN DETAIL:   The patient was met in the holding area and  identified.  The appropriate hip was identified and marked at the operative site.  The patient was then transported to the OR  and  placed under anesthesia.  At that point, the patient was  placed in the lateral decubitus position with the  operative side up and  secured to the operating room table  and all bony prominences padded. A subaxillary role was also placed.    The operative lower extremity was prepped from the iliac crest to the distal leg.  Sterile draping was performed.  Preoperative antibiotics, 2 gm of ancef,1 gm of Tranexamic Acid, and 8 mg of  Decadron administered. Time out was performed prior to incision.      A routine posterolateral approach was utilized via sharp dissection through the patient's old incision.  Dissection was carried down to the subcutaneous tissue.  Gross bleeders were Bovie coagulated.  The iliotibial band was identified and incised along the length of the skin incision through the glute max fascia.  Charnley retractor was placed with care to protect the sciatic nerve posteriorly.   A capsulotomy was then performed off the femoral insertion and also tagged with a #5 Ethibond.  Upon opening of the capsule there was significant metal stained fluid in the joint.  The synovial lining was also completely stained black.  Fortunately there was no damage to the abductors or other muscles around the hip.  The hip was then dislocated and the ceramic ball was removed.  The old ceramic ball was a 36+6 mm ball.  There was no significant wear damage to the trunnion.  The trunnion was thoroughly cleaned.    I then exposed the deep acetabulum.  The old polyethylene liner had broken and spun out and was loose in the socket.  This was easily removed.  There was a single screw in the cup which was removed without difficulty.  The cup however was well-fixed.  Notably the position was vertical and had minimal anteversion.  The cuff also had significant superior wear and damage and did not feel the locking mechanism was in good condition to place a new liner.  At this point felt the cup needed to be revised.  Using cup cutters we worked around the cup circumferentially until the cup came loose.  The cup was then removed.  The old cup was a 54 mm cup and had a neutral liner.  We then started reaming with a 50 mm reamer we started by reaming medially down to the medial wall.  I reamed up to 56 mm reamer with good bony bed preparation and a 56 mm cup was chosen.  I made sure to place the cup at about 40 degrees of inclination and 20 degrees of  anteversion matching the patient's native anatomy.  The real cup was then impacted into place.  Appropriate version and inclination was confirmed clinically matching their bony anatomy, and also with the use of the jig.  I placed 3 screws in the posterior superior quadrant to augment fixation.  Given the medialization of the new cup I trialed a +4 liner and a 36+9 head ball.    I reduced the hip and it was found to have excellent stability.  There was no impingement with full extension and 90 degrees external rotation.  The hip was stable at the position of sleep and with 90 degrees flexion and 80 degrees of internal rotation.  Leg lengths were also clinically assessed in the lateral position and felt to be equal. Intra-Op flatplate was obtained and confirmed appropriate component position and restoration of leg length and offset. No evidence or concern for fracture.  I then impacted the +4 liner as well as the +9 head ball.  Unfortunately there is no titanium sleeve for the 11/13  taper of the S-ROM so a cobalt chrome head was selected as there was concern with the ceramic head would be at risk for possible ceramic head fracture given possible micro wear to the trunnion.  The posterior capsule was then closed with #5 Ethibond.   I then irrigated the hip copiously with dilute Betadine and with normal saline pulse lavage. Periarticular injection was then performed with Exparel.   We repaired the fascia #1 barbed suture, followed by 0 barbed suture for the subcutaneous fat.  Skin was closed with 2-0 Vicryl and 3-0 Nylon.  Prevena wound VAC dressing dressing were applied. The patient was then awakened and returned to PACU in stable and satisfactory condition.  Leg lengths in the supine position were assessed and felt to be clinically equal. There were no complications.  Post op recs: WB: WBAT RLE, posterior precautions x 6 weeks Abx: ancef while in-house, discharge on cefadroxil 500 mg twice daily x 7  days Imaging: PACU pelvis Xray Dressing: Prevena wound VAC keep intact until follow-up DVT prophylaxis: Aspirin 81BID starting POD1 Follow up: 2 weeks after surgery for a wound check with Dr. Blanchie Dessert at [facility].  Address: [address], [place], [place] [address]  Office Phone: [phone]   Weber Cooks, MD Orthopedic Surgeon

## 2023-08-29 NOTE — Transfer of Care (Signed)
Immediate Anesthesia Transfer of Care Note  Patient: Sheri Brown  Procedure(s) Performed: TOTAL HIP REVISION (Right: Hip)  Patient Location: PACU  Anesthesia Type:Spinal  Level of Consciousness: awake and alert   Airway & Oxygen Therapy: Patient Spontanous Breathing  Post-op Assessment: Report given to RN and Post -op Vital signs reviewed and stable  Post vital signs: Reviewed and stable  Last Vitals:  Vitals Value Taken Time  BP 143/87 08/29/23 1601  Temp 36.4 C 08/29/23 1445  Pulse 83 08/29/23 1606  Resp 14 08/29/23 1606  SpO2 98 % 08/29/23 1606  Vitals shown include unfiled device data.  Last Pain:  Vitals:   08/29/23 1027  TempSrc:   PainSc: 0-No pain      Patients Stated Pain Goal: 0 (08/28/23 0056)  Complications: No notable events documented.

## 2023-08-29 NOTE — Progress Notes (Signed)
     Subjective:  Patient reports pain as improved this morning. Having decreased pain in the left knee. Localizes pain to the right hip area. Denies distal n/t.  Objective:   VITALS:   Vitals:   08/28/23 1520 08/28/23 2000 08/28/23 2333 08/29/23 0422  BP: (!) 140/77 111/61 (!) 153/87 (!) 156/96  Pulse: 63 66 64 65  Resp: 15 18 18 16   Temp: 98.6 F (37 C) 98.2 F (36.8 C) 98.7 F (37.1 C) 98.6 F (37 C)  TempSrc: Oral Oral Oral Oral  SpO2: 97% 95% 97% 99%  Weight:      Height:        Sensation intact distally Intact pulses distally Dorsiflexion/Plantar flexion intact Incision: well healed old scar Compartment soft Pain with log roll and hip range of motion.  Lab Results  Component Value Date   WBC 6.2 08/27/2023   HGB 9.8 (L) 08/27/2023   HCT 29.3 (L) 08/27/2023   MCV 83.5 08/27/2023   PLT 120 (L) 08/27/2023   BMET    Component Value Date/Time   NA 138 08/27/2023 2029   K 4.0 08/27/2023 2029   CL 108 08/27/2023 2029   CO2 23 08/27/2023 2029   GLUCOSE 120 (H) 08/27/2023 2029   BUN 25 (H) 08/27/2023 2029   CREATININE 1.18 (H) 08/27/2023 2029   CALCIUM 8.8 (L) 08/27/2023 2029   GFRNONAA 46 (L) 08/27/2023 2029    Xray: CT scan shows head ball articulation on the acetabular component. 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  Discussed with patient and family that she has developed wear or breakage of the original poly liner done 14 year prior. Would recommend revision at this point. Discussed possibility of doing a head liner exchange vs a full component revision. Stem appears well fixed so no plan to revise unless needed for  stability. Patient has been NPO.   Inflammatory markers within normal limits reassuring against possibility of prosthetic joint infection.    Nayla Dias A Sydney Hasten 08/29/2023, 8:37 AM   Weber Cooks, MD  Contact information:   [phone] 7am-5pm epic message Dr. Blanchie Dessert, or call office for patient follow up: [phone] After hours and holidays please check [email or web address] for group call information for [facility]

## 2023-08-29 NOTE — Progress Notes (Signed)
[facility] PROGRESS NOTE  Libbie Bartley (DOB: August 16, 1936) BJY:[id number] PCP: [name], PA-C  Brief Narrative: Sheri Brown is an 87 y.o. female with a history of right hip arthroplasty ~2010, HFpEF, orthostatic hypotension who presented to the ED on 08/27/2023 with right hip pain, inability to ambulate, admitted by orthopedic surgery for failure of prosthesis, requiring revision on 10/9.   Subjective: Seen immediately after returning to the floor postoperatively, still some anesthetic effects. Daughter at bedside. Pt has no complaints.   Objective: BP (!) 142/88   Pulse 72   Temp 97.6 F (36.4 C)   Resp 12   Ht 5\' 5"  (1.651 m)   Wt 61.2 kg   SpO2 96%   BMI 22.47 kg/m   Gen: No distress Pulm: Clear, nonlabored  CV: RRR, no MRG, No edema.  GI: Soft, NT, ND, +BS Neuro: Alert and oriented. No new focal deficits.  Assessment & Plan: Right hip arthroplasty prosthesis failure:  - Orthopedics planning revision/explant today. Inflammatory markers not elevated, so not suggestive of PJI.  - PT/OT to evaluate postoperatively.  Chronic HFpEF: Discharge summary from [facility] admission 9/19-9/23 reviewed.  - Continue metoprolol, lasix 20mg . Fortunately appears to have no pulmonary or peripheral edema at this time.   Orthostatic hypotension:  - Midodrine was initiated, would change to prn given her elevated BPs.   Stage IIIb CKD: At baseline.  - Avoid nephrotoxins, monitor in AM, encourage  HTN:  - Continue metoprolol succinate  Thrombocytopenia:  - Monitor in AM.   Essential tremor: No Tx  Tyrone Nine, MD [facility] [email or web address] 08/29/2023, 4:22 PM

## 2023-08-29 NOTE — Progress Notes (Signed)
[facility] Pre-op Review 1.Surgical orders: Consent orders and signed?   Yes Antibiotic:    Ancef  2.  Pre-procedure checklist completed:  Yes  3.  NPO:  Since Midnight  4.  CHG Bath completed:  Request Rhona Leavens, LPN to complete  Belongings removed.  Placed in clean gown.  5.  Labs Performed: CBC    Abnormal CMP/BMP   Abnormal PT/INR  NA HA1C   NA Type and Screen Order placed to complete with ABO Surgical PCR:  08/28/23 Pregnancy:  NA EKG:    08/27/23 Chest x-ray:     6.  Recent H&P or progress note if inpatient:  08/27/23  7.  Language Barrier:  NA  8.  Vital Signs:  Hypertensive Oxygen:  Room Air  9.  Medications: Cardiac Drips:  NA Pain Medications: None given Beta Blocker:   Metoprolol last dose 2120 Anticoagulants:  NA GLP1:   NA  Pre-op Medications Ordered: Ancef, TXA, Povidone nasal  10. IV access:  22 Right FA  11. Diabetic:   NA

## 2023-08-29 NOTE — H&P (View-Only) (Signed)
     Subjective:  Patient reports pain as improved this morning. Having decreased pain in the left knee. Localizes pain to the right hip area. Denies distal n/t.  Objective:   VITALS:   Vitals:   08/28/23 1520 08/28/23 2000 08/28/23 2333 08/29/23 0422  BP: (!) 140/77 111/61 (!) 153/87 (!) 156/96  Pulse: 63 66 64 65  Resp: 15 18 18 16   Temp: 98.6 F (37 C) 98.2 F (36.8 C) 98.7 F (37.1 C) 98.6 F (37 C)  TempSrc: Oral Oral Oral Oral  SpO2: 97% 95% 97% 99%  Weight:      Height:        Sensation intact distally Intact pulses distally Dorsiflexion/Plantar flexion intact Incision: well healed old scar Compartment soft Pain with log roll and hip range of motion.  Lab Results  Component Value Date   WBC 6.2 08/27/2023   HGB 9.8 (L) 08/27/2023   HCT 29.3 (L) 08/27/2023   MCV 83.5 08/27/2023   PLT 120 (L) 08/27/2023   BMET    Component Value Date/Time   NA 138 08/27/2023 2029   K 4.0 08/27/2023 2029   CL 108 08/27/2023 2029   CO2 23 08/27/2023 2029   GLUCOSE 120 (H) 08/27/2023 2029   BUN 25 (H) 08/27/2023 2029   CREATININE 1.18 (H) 08/27/2023 2029   CALCIUM 8.8 (L) 08/27/2023 2029   GFRNONAA 46 (L) 08/27/2023 2029    Xray: CT scan shows head ball articulation on the acetabular component. 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  Discussed with patient and family that she has developed wear or breakage of the original poly liner done 14 year prior. Would recommend revision at this point. Discussed possibility of doing a head liner exchange vs a full component revision. Stem appears well fixed so no plan to revise unless needed for  stability. Patient has been NPO.   Inflammatory markers within normal limits reassuring against possibility of prosthetic joint infection.    Nayla Dias A Sydney Hasten 08/29/2023, 8:37 AM   Weber Cooks, MD  Contact information:   [phone] 7am-5pm epic message Dr. Blanchie Dessert, or call office for patient follow up: [phone] After hours and holidays please check [email or web address] for group call information for [facility]

## 2023-08-29 NOTE — Plan of Care (Signed)
  Problem: Education: Goal: Knowledge of General Education information will improve Description: Including pain rating scale, medication(s)/side effects and non-pharmacologic comfort measures Outcome: Progressing   Problem: Clinical Measurements: Goal: Will remain free from infection Outcome: Progressing   Problem: Activity: Goal: Risk for activity intolerance will decrease Outcome: Progressing   Problem: Nutrition: Goal: Adequate nutrition will be maintained Outcome: Progressing   Problem: Elimination: Goal: Will not experience complications related to bowel motility Outcome: Progressing Goal: Will not experience complications related to urinary retention Outcome: Progressing   Problem: Pain Managment: Goal: General experience of comfort will improve Outcome: Progressing   Problem: Safety: Goal: Ability to remain free from injury will improve Outcome: Progressing   Problem: Skin Integrity: Goal: Risk for impaired skin integrity will decrease Outcome: Progressing

## 2023-08-29 NOTE — Anesthesia Procedure Notes (Signed)
Spinal  Patient location during procedure: OR Start time: 08/29/2023 11:55 AM End time: 08/29/2023 12:00 PM Reason for block: surgical anesthesia Staffing Performed: anesthesiologist  Anesthesiologist: Albion Nation, MD Performed by:  Nation, MD Authorized by:  Nation, MD   Preanesthetic Checklist Completed: patient identified, IV checked, site marked, risks and benefits discussed, surgical consent, monitors and equipment checked, pre-op evaluation and timeout performed Spinal Block Patient position: sitting Prep: DuraPrep Patient monitoring: heart rate, cardiac monitor, continuous pulse ox and blood pressure Approach: midline Location: L4-5 Needle Needle type: Quincke  Needle gauge: 24 G Assessment Sensory level: T6 Additional Notes Functioning IV was confirmed and monitors were applied. Sterile prep and drape, including hand hygiene and sterile gloves were used. The patient was positioned and the spine was prepped. The skin was anesthetized with lidocaine.  Free flow of clear CSF was obtained prior to injecting local anesthetic into the CSF.  The spinal needle aspirated freely following injection.  The needle was carefully withdrawn.  The patient tolerated the procedure well.

## 2023-08-29 NOTE — Anesthesia Preprocedure Evaluation (Addendum)
Anesthesia Evaluation  Patient identified by MRN, date of birth, ID band Patient awake    Reviewed: Allergy & Precautions, H&P , NPO status , Patient's Chart, lab work & pertinent test results  History of Anesthesia Complications (+) PONV and history of anesthetic complications  Airway Mallampati: II  TM Distance: >3 FB Neck ROM: Full    Dental no notable dental hx.    Pulmonary neg pulmonary ROS   Pulmonary exam normal breath sounds clear to auscultation       Cardiovascular hypertension, Normal cardiovascular exam Rhythm:Regular Rate:Normal     Neuro/Psych negative neurological ROS  negative psych ROS   GI/Hepatic Neg liver ROS,GERD  ,,  Endo/Other  negative endocrine ROS    Renal/GU CRFRenal disease  negative genitourinary   Musculoskeletal  (+) Arthritis ,    Abdominal   Peds negative pediatric ROS (+)  Hematology  (+) Blood dyscrasia, anemia   Anesthesia Other Findings Plt 120 No hx of anticoagulation / antiplatelet meds  Reproductive/Obstetrics negative OB ROS                             Anesthesia Physical Anesthesia Plan  ASA: 3  Anesthesia Plan: Spinal   Post-op Pain Management:    Induction:   PONV Risk Score and Plan: Treatment may vary due to age or medical condition  Airway Management Planned: Natural Airway  Additional Equipment:   Intra-op Plan:   Post-operative Plan:   Informed Consent: I have reviewed the patients History and Physical, chart, labs and discussed the procedure including the risks, benefits and alternatives for the proposed anesthesia with the patient or authorized representative who has indicated his/her understanding and acceptance.     Dental advisory given  Plan Discussed with: CRNA  Anesthesia Plan Comments:         Anesthesia Quick Evaluation

## 2023-08-29 NOTE — Plan of Care (Signed)

## 2023-08-29 NOTE — Anesthesia Procedure Notes (Signed)
Procedure Name: MAC Date/Time: 08/29/2023 12:10 PM  Performed by: Vanessa Tohatchi, CRNAPre-anesthesia Checklist: Patient identified, Emergency Drugs available, Suction available and Patient being monitored Patient Re-evaluated:Patient Re-evaluated prior to induction Oxygen Delivery Method: Simple face mask

## 2023-08-30 ENCOUNTER — Encounter (HOSPITAL_COMMUNITY): Payer: Self-pay | Admitting: Orthopedic Surgery

## 2023-08-30 DIAGNOSIS — Z96641 Presence of right artificial hip joint: Secondary | ICD-10-CM | POA: Diagnosis not present

## 2023-08-30 DIAGNOSIS — G8929 Other chronic pain: Secondary | ICD-10-CM | POA: Diagnosis not present

## 2023-08-30 DIAGNOSIS — M25551 Pain in right hip: Secondary | ICD-10-CM | POA: Diagnosis not present

## 2023-08-30 LAB — CBC
HCT: 28.3 % — ABNORMAL LOW (ref 36.0–46.0)
Hemoglobin: 9.7 g/dL — ABNORMAL LOW (ref 12.0–15.0)
MCH: 28.5 pg (ref 26.0–34.0)
MCHC: 34.3 g/dL (ref 30.0–36.0)
MCV: 83.2 fL (ref 80.0–100.0)
Platelets: 89 10*3/uL — ABNORMAL LOW (ref 150–400)
RBC: 3.4 MIL/uL — ABNORMAL LOW (ref 3.87–5.11)
RDW: 14.8 % (ref 11.5–15.5)
WBC: 11.4 10*3/uL — ABNORMAL HIGH (ref 4.0–10.5)
nRBC: 0 % (ref 0.0–0.2)

## 2023-08-30 LAB — BASIC METABOLIC PANEL
Anion gap: 6 (ref 5–15)
BUN: 20 mg/dL (ref 8–23)
CO2: 25 mmol/L (ref 22–32)
Calcium: 8.4 mg/dL — ABNORMAL LOW (ref 8.9–10.3)
Chloride: 103 mmol/L (ref 98–111)
Creatinine, Ser: 1.11 mg/dL — ABNORMAL HIGH (ref 0.44–1.00)
GFR, Estimated: 49 mL/min — ABNORMAL LOW (ref >60)
Glucose, Bld: 121 mg/dL — ABNORMAL HIGH (ref 70–99)
Potassium: 4.1 mmol/L (ref 3.5–5.1)
Sodium: 134 mmol/L — ABNORMAL LOW (ref 135–145)

## 2023-08-30 LAB — TYPE AND SCREEN
ABO/RH(D): O POS
Antibody Screen: NEGATIVE
Unit division: 0

## 2023-08-30 LAB — BPAM RBC
Blood Product Expiration Date: 202411072359
ISSUE DATE / TIME: 202410091309
Unit Type and Rh: 5100

## 2023-08-30 MED ORDER — ASPIRIN 81 MG PO TBEC
81.0000 mg | DELAYED_RELEASE_TABLET | Freq: Two times a day (BID) | ORAL | Status: DC
  Administered 2023-08-30 – 2023-09-06 (×15): 81 mg via ORAL
  Filled 2023-08-30 (×15): qty 1

## 2023-08-30 MED ORDER — METHOCARBAMOL 500 MG PO TABS
500.0000 mg | ORAL_TABLET | Freq: Four times a day (QID) | ORAL | Status: DC | PRN
  Administered 2023-08-31 – 2023-09-04 (×4): 500 mg via ORAL
  Filled 2023-08-30 (×4): qty 1

## 2023-08-30 MED ORDER — CHLORHEXIDINE GLUCONATE CLOTH 2 % EX PADS
6.0000 | MEDICATED_PAD | Freq: Every day | CUTANEOUS | Status: DC
  Administered 2023-08-30 – 2023-09-06 (×8): 6 via TOPICAL

## 2023-08-30 NOTE — Plan of Care (Signed)

## 2023-08-30 NOTE — Progress Notes (Addendum)
[facility] PROGRESS NOTE  Dhani Imel (DOB: [birth year]) WGN:[id number] PCP: [name], PA-C  Brief Narrative: Sheri Brown is an 87 y.o. female with a history of right hip arthroplasty ~2010, HFpEF, orthostatic hypotension who presented to the ED on 08/27/2023 with right hip pain, inability to ambulate, admitted by orthopedic surgery for failure of prosthesis, requiring revision on 10/9.   Subjective: Tired this morning, held any oral intake in mouth without swallowing for RN this AM. Did have some cough as well. She did not have complaints to me this morning. No family at bedside  Objective: BP (!) 142/74 (BP Location: Right Arm)   Pulse 82   Temp 98.4 F (36.9 C) (Oral)   Resp 16   Ht 5\' 5"  (1.651 m)   Wt 61.2 kg   SpO2 99%   BMI 22.47 kg/m   Gen: Elderly frail female in no distress Pulm: Clear, nonlabored  CV: RRR, no MRG or pitting edema GI: Soft, NT, ND, +BS  Neuro: Sleeping but awakens, withdrawn but tracks with eyes and vocalizes. Moved all extremities this AM Ext: Warm, dry, R lateral thigh/hip wound vac in good position without surrounding erythema and no discharge.  Assessment & Plan: Right hip arthroplasty prosthesis failure:  - Orthopedics planning revision/explant today. Inflammatory markers not elevated, so not suggestive of PJI. Monitoring cultures while on first generation cephalosporin. - PT/OT to evaluate postoperatively. Ordered OT in anticipation of SNF requirement post discharge.  Dysphagia: May be more related to delirium, suspect hypoactive delirium.  - SLP evaluation to inform safest diet and precautions.   Chronic HFpEF: Discharge summary from [facility] admission 9/19-9/23 reviewed.  - Continue metoprolol, lasix 20mg . Does not appear to have pulmonary or peripheral edema at this time. Will confirm ability to wean to room air.   Orthostatic hypotension:  - Midodrine was initiated during recent hospital stay, not a standing order. BP  currently elevated, so not ordered here at this time.    Stage IIIb CKD: At baseline.  - Avoid nephrotoxins, stable.  Postoperative blood loss anemia:  - Recheck in AM  HTN:  - Continue metoprolol succinate  Thrombocytopenia:  - Down postoperatively, still >50k. Will recheck in AM or prn bleeding. Ok for ASA 81mg  BID for VTE ppx per primary.  Essential tremor: No Tx  Tyrone Nine, MD [facility] [email or web address] 08/30/2023, 9:45 AM

## 2023-08-30 NOTE — Progress Notes (Addendum)
     Subjective: Patient drowsy this morning. Not answering questions, but did say come in upon knocking on the door. Lying comfortably in bed. Shows discomfort when trying to move either foot or leg. ,   Objective:   VITALS:   Vitals:   08/29/23 1630 08/29/23 1655 08/29/23 2146 08/30/23 0513  BP: (!) 150/98 (!) 159/84 122/69 (!) 130/58  Pulse: 75 74 79 83  Resp: 16 16 18 17   Temp:    99.1 F (37.3 C)  TempSrc:    Oral  SpO2: 93% 100% 100% 99%  Weight:      Height:        Intact pulses distally Incision: dressing C/D/I Compartment soft Wound vac holding suction in cannister   Lab Results  Component Value Date   WBC 6.2 08/27/2023   HGB 12.6 08/29/2023   HCT 37.0 08/29/2023   MCV 83.5 08/27/2023   PLT 120 (L) 08/27/2023   BMET    Component Value Date/Time   NA 140 08/29/2023 1518   K 4.3 08/29/2023 1518   CL 104 08/29/2023 1518   CO2 23 08/27/2023 2029   GLUCOSE 89 08/29/2023 1518   BUN 17 08/29/2023 1518   CREATININE 1.10 (H) 08/29/2023 1518   CALCIUM 8.8 (L) 08/27/2023 2029   GFRNONAA 46 (L) 08/27/2023 2029      Xray: THA revision components in good position, no adverse features  Assessment/Plan: 1 Day Post-Op   Principal Problem:   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 Imaging: PACU pelvis Xray Dressing: Prevena wound VAC keep intact until follow-up DVT prophylaxis: Aspirin 81BID starting POD1 Follow up: 2 weeks after surgery for a wound check with Dr. Blanchie Dessert at [facility].  Address: [address], [place], [place] [address]  Office Phone:  [phone]   Joen Laura 08/30/2023, 6:55 AM   Weber Cooks, MD  Contact information:   [phone] 7am-5pm epic message Dr. Blanchie Dessert, or call office for patient follow up: [phone] After hours and holidays please check [email or web address] for group call information for [facility]

## 2023-08-30 NOTE — Evaluation (Signed)
Physical Therapy Evaluation Patient Details Name: Sheri Brown MRN: [id number] DOB: Nov 05, 1936 Today's Date: 08/30/2023  History of Present Illness  Sheri Brown is an 87 y.o. female with a history of right hip arthroplasty ~2010, HFpEF, orthostatic hypotension who presented to the ED on 08/27/2023 with right hip pain, inability to ambulate, admitted by orthopedic surgery for failure of right  prosthesis, requiring revision on 08/29/23 of right with explant and revision of the acetabular component as well as the ball and liner, retention of the femoral stem.  Clinical Impression  Pt admitted with above diagnosis.  Pt currently with functional limitations due to the deficits listed below (see PT Problem List). Pt will benefit from acute skilled PT to increase their independence and safety with mobility to allow discharge.       The patient is very lethargic, arouses but stares, does answer  afew personal questions, ID'd family in room and on phone.  Patient's right leg internally rotated in the bed, assisted to place near neutral and float heel with patient yelling out, indicating pain. Patient had pain meds prior, Family reports multiple joint pain due to arthritis PTA.  At baseline, patient ambulatory with rollator, ambulates to meals, does receive assistance for B/D at ALF. Patient independent in toileting. Patient will benefit from continued inpatient follow up therapy, <3 hours/day     If plan is discharge home, recommend the following: Two people to help with walking and/or transfers;Two people to help with bathing/dressing/bathroom;Assistance with feeding;Help with stairs or ramp for entrance;Assistance with cooking/housework;Assist for transportation   Can travel by private vehicle   No    Equipment Recommendations None recommended by PT  Recommendations for Other Services       Functional Status Assessment Patient has had a recent decline in their functional status and demonstrates  the ability to make significant improvements in function in a reasonable and predictable amount of time.     Precautions / Restrictions Precautions Precautions: Posterior Hip Precaution Comments: right  leg int. rotated, repositioned to  neutral alignment with pillows Restrictions RLE Weight Bearing: Weight bearing as tolerated      Mobility  Bed Mobility Overal bed mobility: Needs Assistance Bed Mobility: Rolling Rolling: Total assist, +2 for physical assistance, +2 for safety/equipment         General bed mobility comments: patient very lethargic, did not arouse enough to follow, moans out with any repositioning of the  body, ep right leg. Placed in bed chair position  to work on arousal and attemt rom legs. daughter in room.    Transfers                        Ambulation/Gait                  Stairs            Wheelchair Mobility     Tilt Bed    Modified Rankin (Stroke Patients Only)       Balance                                             Pertinent Vitals/Pain Pain Assessment Pain Assessment: PAINAD Breathing: occasional labored breathing, short period of hyperventilation Negative Vocalization: repeated troubled calling out, loud moaning/groaning, crying Facial Expression: sad, frightened, frown Body Language: tense, distressed pacing, fidgeting Consolability: distracted or reassured  by voice/touch PAINAD Score: 6 Pain Intervention(s): Monitored during session, Premedicated before session    Home Living Family/patient expects to be discharged to:: Assisted living                 Home Equipment: Transport chair Additional Comments: [employer]    Prior Function Prior Level of Function : Needs assist       Physical Assist : ADLs (physical)     Mobility Comments: Independnet with a rollator, amb to dining, Independnet toileting ADLs Comments: Staff assisted with dressing in AM and PM      Extremity/Trunk Assessment   Upper Extremity Assessment Upper Extremity Assessment: RUE deficits/detail;LUE deficits/detail RUE Deficits / Details: dtr reports patient  has OA in shoulder, does not use it very well, has become more left handed    Lower Extremity Assessment Lower Extremity Assessment: RLE deficits/detail;LLE deficits/detail RLE Deficits / Details: limited active movement noted with poor arousal to follow directions, indicates pain in  leg with PROM, LLE Deficits / Details: minimal active movement, does not follow directions, dtr reports "bad arthritis in  knee"    Cervical / Trunk Assessment Cervical / Trunk Assessment: Other exceptions Cervical / Trunk Exceptions: unable to assess  Communication   Communication Communication: Difficulty communicating thoughts/reduced clarity of speech;Difficulty following commands/understanding Following commands: Follows one step commands inconsistently Cueing Techniques: Verbal cues;Gestural cues;Tactile cues  Cognition Arousal: Lethargic Behavior During Therapy: Flat affect Overall Cognitive Status: Impaired/Different from baseline Area of Impairment: Orientation, Attention, Memory, Following commands, Awareness                 Orientation Level: Place, Time, Situation Current Attention Level: Focused   Following Commands: Follows one step commands inconsistently   Awareness: Intellectual   General Comments: patient is very lethargic, difficult in making eye contact when she looks at therapist and family        General Comments      Exercises     Assessment/Plan    PT Assessment Patient needs continued PT services  PT Problem List Decreased strength;Decreased mobility;Decreased safety awareness;Decreased range of motion;Decreased knowledge of precautions;Decreased activity tolerance;Decreased cognition;Decreased balance       PT Treatment Interventions DME instruction;Therapeutic exercise;Gait  training;Functional mobility training;Therapeutic activities;Patient/family education;Cognitive remediation    PT Goals (Current goals can be found in the Care Plan section)  Acute Rehab PT Goals Patient Stated Goal: to walk PT Goal Formulation: With family Time For Goal Achievement: 09/13/23 Potential to Achieve Goals: Fair    Frequency Min 1X/week     Co-evaluation               AM-PAC PT "6 Clicks" Mobility  Outcome Measure Help needed turning from your back to your side while in a flat bed without using bedrails?: Total Help needed moving from lying on your back to sitting on the side of a flat bed without using bedrails?: Total Help needed moving to and from a bed to a chair (including a wheelchair)?: Total Help needed standing up from a chair using your arms (e.g., wheelchair or bedside chair)?: Total Help needed to walk in hospital room?: Total Help needed climbing 3-5 steps with a railing? : Total 6 Click Score: 6    End of Session   Activity Tolerance: Patient limited by lethargy;Patient limited by pain Patient left: in bed;with family/visitor present;with call bell/phone within reach;with bed alarm set Nurse Communication: Mobility status PT Visit Diagnosis: Unsteadiness on feet (R26.81);History of falling (Z91.81);Difficulty in walking, not elsewhere  classified (R26.2);Pain Pain - Right/Left: Right Pain - part of body: Hip    Time: 1000-1025 PT Time Calculation (min) (ACUTE ONLY): 25 min   Charges:   PT Evaluation $PT Eval Low Complexity: 1 Low PT Treatments $Therapeutic Activity: 8-22 mins PT General Charges $$ ACUTE PT VISIT: 1 Visit         Blanchard Kelch PT Acute Rehabilitation Services Office [phone] Weekend [phone]   Rada Hay 08/30/2023, 3:32 PM

## 2023-08-30 NOTE — Evaluation (Signed)
Clinical/Bedside Swallow Evaluation Patient Details  Name: Sheri Brown MRN: [id number] Date of Birth: [birth year]  Today's Date: 08/30/2023 Time: SLP Start Time (ACUTE ONLY): 1205 SLP Stop Time (ACUTE ONLY): 1221 SLP Time Calculation (min) (ACUTE ONLY): 16 min  Past Medical History:  Past Medical History:  Diagnosis Date   Anemia    Arthritis    OA hands   Benign essential tremor 08/24/2017   Complication of anesthesia    "doesnt need alot of anesthesia"   GERD (gastroesophageal reflux disease)    History of hyperlipidemia    Hypertension 03/06/2016   Infected sebaceous cyst    Memory difficulties 08/24/2017   Orthostatic hypotension    PONV (postoperative nausea and vomiting)    Right inguinal hernia 09/07/2017   Sickle cell trait (HCC)    Thrombocytopathia (HCC)    Vitamin D deficiency    Weight loss    Past Surgical History:  Past Surgical History:  Procedure Laterality Date   ABDOMINAL HYSTERECTOMY     BLADDER SUSPENSION     BUNIONECTOMY     CARDIAC CATHETERIZATION N/A 08/08/2016   Procedure: Right Heart Cath;  Surgeon: Yates Decamp, MD;  Location: [facility] INVASIVE CV LAB;  Service: Cardiovascular;  Laterality: N/A;   EYE SURGERY     cataract   INGUINAL HERNIA REPAIR Right 09/07/2017   Procedure: OPEN REPAIR RIGHT INGUINAL HERNIA WITH MESH;  Surgeon: Claud Kelp, MD;  Location: [facility];  Service: General;  Laterality: Right;   INSERTION OF MESH Right 09/07/2017   Procedure: INSERTION OF MESH;  Surgeon: Claud Kelp, MD;  Location: [facility];  Service: General;  Laterality: Right;   JOINT REPLACEMENT Right    TOTAL HIP REVISION Right 08/29/2023   Procedure: TOTAL HIP REVISION;  Surgeon: Joen Joyceann Kruser, MD;  Location: [facility];  Service: Orthopedics;  Laterality: Right;   HPI:  Pt is an 87 yo female presenting 10/7  with R hip pain. She was admitted for failure of prosthesis, requiring revision 10/9. Pt was observed to have oral  holding and some coughing with POs on 10/10. Pt had a previous clinical swallow eval at outside facility in September 2023 that was [facility]. PMH includes: GERD, memory difficulties, R hip arthroplasty ~ 2010, HFpEF, orthostatic hypotension    Assessment / Plan / Recommendation  Clinical Impression  Pt presents with signs of a cognitively-based dysphagia primarily characterized by oral holding. It is mildly improved with SLP providing assistance for self-feeding, but even still it can last upwards of 30 seconds per bolus while SLP is cueing pt to swallow. Mild oral residue remains with purees, which is mostly cleared with a liquid wash although SLP did clean remaining residue out at the end of trials. Recommend changing to full liquid diet at this time with potential to advance back to more solid textures with improvements in mentation. \ SLP Visit Diagnosis: Dysphagia, oral phase (R13.11)    Aspiration Risk  Mild aspiration risk;Risk for inadequate nutrition/hydration    Diet Recommendation Thin liquid (full liquid diet)    Liquid Administration via: Cup;Straw Medication Administration: Crushed with puree Supervision: Staff to assist with self feeding;Full supervision/cueing for compensatory strategies Compensations: Minimize environmental distractions;Slow rate;Small sips/bites;Follow solids with liquid Postural Changes: Seated upright at 90 degrees    Other  Recommendations Oral Care Recommendations: Oral care BID    Recommendations for follow up therapy are one component of a multi-disciplinary discharge planning process, led by the attending physician.  Recommendations may be updated  based on patient status, additional functional criteria and insurance authorization.  Follow up Recommendations  (tba)      Assistance Recommended at Discharge    Functional Status Assessment Patient has had a recent decline in their functional status and demonstrates the ability to make significant  improvements in function in a reasonable and predictable amount of time.  Frequency and Duration min 2x/week  2 weeks       Prognosis Prognosis for improved oropharyngeal function: Good Barriers to Reach Goals: Cognitive deficits      Swallow Study   General HPI: Pt is an 87 yo female presenting 10/7  with R hip pain. She was admitted for failure of prosthesis, requiring revision 10/9. Pt was observed to have oral holding and some coughing with POs on 10/10. Pt had a previous clinical swallow eval at outside facility in September 2023 that was [facility]. PMH includes: GERD, memory difficulties, R hip arthroplasty ~ 2010, HFpEF, orthostatic hypotension Type of Study: Bedside Swallow Evaluation Previous Swallow Assessment: see HPI Diet Prior to this Study: Regular;Thin liquids (Level 0) Temperature Spikes Noted: No Respiratory Status: Nasal cannula History of Recent Intubation: No Behavior/Cognition: Alert;Cooperative;Doesn't follow directions Oral Cavity Assessment: Other (comment) (small amount of residue from food on tongue) Oral Care Completed by SLP: Yes Oral Cavity - Dentition: Adequate natural dentition Vision: Functional for self-feeding Self-Feeding Abilities: Needs assist Patient Positioning: Upright in bed Baseline Vocal Quality: Normal Volitional Cough: Cognitively unable to elicit Volitional Swallow: Unable to elicit    Oral/Motor/Sensory Function Overall Oral Motor/Sensory Function: Within functional limits (as can be assessed - not following all commands)   Ice Chips Ice chips: Not tested   Thin Liquid Thin Liquid: Impaired Presentation: Self Fed;Straw Oral Phase Functional Implications: Oral holding    Nectar Thick Nectar Thick Liquid: Not tested   Honey Thick Honey Thick Liquid: Not tested   Puree Puree: Impaired Presentation: Spoon Oral Phase Functional Implications: Oral holding;Oral residue   Solid     Solid: Not tested      Mahala Menghini., M.A. CCC-SLP Acute  Rehabilitation Services Office [phone]  Secure chat preferred  08/30/2023,1:10 PM

## 2023-08-30 NOTE — Progress Notes (Addendum)
The patient appears to have some difficulty swallowing. She does not swallow on command.The pt was holding water and pills in mouth. Upon swallowing pt began to cough x 2 episodes. Hospitalist provider updated.

## 2023-08-31 ENCOUNTER — Inpatient Hospital Stay (HOSPITAL_COMMUNITY): Payer: Medicare Other

## 2023-08-31 DIAGNOSIS — Z96641 Presence of right artificial hip joint: Secondary | ICD-10-CM | POA: Diagnosis not present

## 2023-08-31 DIAGNOSIS — G8929 Other chronic pain: Secondary | ICD-10-CM | POA: Diagnosis not present

## 2023-08-31 DIAGNOSIS — M25551 Pain in right hip: Secondary | ICD-10-CM | POA: Diagnosis not present

## 2023-08-31 LAB — CBC
HCT: 28.1 % — ABNORMAL LOW (ref 36.0–46.0)
Hemoglobin: 9.7 g/dL — ABNORMAL LOW (ref 12.0–15.0)
MCH: 28.9 pg (ref 26.0–34.0)
MCHC: 34.5 g/dL (ref 30.0–36.0)
MCV: 83.6 fL (ref 80.0–100.0)
Platelets: 76 10*3/uL — ABNORMAL LOW (ref 150–400)
RBC: 3.36 MIL/uL — ABNORMAL LOW (ref 3.87–5.11)
RDW: 15.2 % (ref 11.5–15.5)
WBC: 14.3 10*3/uL — ABNORMAL HIGH (ref 4.0–10.5)
nRBC: 0 % (ref 0.0–0.2)

## 2023-08-31 LAB — BASIC METABOLIC PANEL
Anion gap: 11 (ref 5–15)
BUN: 19 mg/dL (ref 8–23)
CO2: 24 mmol/L (ref 22–32)
Calcium: 8.3 mg/dL — ABNORMAL LOW (ref 8.9–10.3)
Chloride: 99 mmol/L (ref 98–111)
Creatinine, Ser: 1.27 mg/dL — ABNORMAL HIGH (ref 0.44–1.00)
GFR, Estimated: 42 mL/min — ABNORMAL LOW (ref >60)
Glucose, Bld: 117 mg/dL — ABNORMAL HIGH (ref 70–99)
Potassium: 3.8 mmol/L (ref 3.5–5.1)
Sodium: 134 mmol/L — ABNORMAL LOW (ref 135–145)

## 2023-08-31 MED ORDER — OXYCODONE HCL 5 MG PO TABS
2.5000 mg | ORAL_TABLET | ORAL | 0 refills | Status: AC | PRN
Start: 2023-08-31 — End: 2023-09-07

## 2023-08-31 MED ORDER — CEFADROXIL 500 MG PO CAPS: 500.0000 mg | ORAL_CAPSULE | Freq: Two times a day (BID) | ORAL | 0 refills | Status: AC

## 2023-08-31 MED ORDER — FUROSEMIDE 20 MG PO TABS: 20.0000 mg | ORAL_TABLET | Freq: Every day | ORAL | Status: DC

## 2023-08-31 MED ORDER — ASPIRIN 81 MG PO TBEC: 81.0000 mg | DELAYED_RELEASE_TABLET | Freq: Two times a day (BID) | ORAL | 0 refills | Status: AC

## 2023-08-31 NOTE — Progress Notes (Signed)
Speech Language Pathology Treatment: Dysphagia  Patient Details Name: Sheri Brown MRN: [id number] DOB: 21-Oct-1936 Today's Date: 08/31/2023 Time: 1914-7829 SLP Time Calculation (min) (ACUTE ONLY): 14 min  Assessment / Plan / Recommendation Clinical Impression  Pt seen for dysphagia management to determine readiness for dietary advancement. Pt's "church daughter" in room with her and reports pt is lethargic. Sheri Brown did awaken to SLP verbal and tactile stimulation but required frequent cues to remain alert. SLP faciliated intake by assisting with intake of graham crackers, ice cream and Ensure milkshake. Prolonged mastication with adequate oral clearance noted. Will advance diet to mechanical soft/dys3/thin - but advised pt must feed herself- to which she was agreeable.   Pt and her family friend agreeable to plan. Will follow up.      HPI HPI: Pt is an 87 yo female presenting 10/7  with R hip pain. She was admitted for failure of prosthesis, requiring revision 10/9. Pt was observed to have oral holding and some coughing with POs on 10/10. Pt had a previous clinical swallow eval at outside facility in September 2023 that was [facility]. PMH includes: GERD, memory difficulties, R hip arthroplasty ~ 2010, HFpEF, orthostatic hypotension      SLP Plan  Continue with current plan of care      Recommendations for follow up therapy are one component of a multi-disciplinary discharge planning process, led by the attending physician.  Recommendations may be updated based on patient status, additional functional criteria and insurance authorization.    Recommendations  Diet recommendations: Dysphagia 3 (mechanical soft);Thin liquid Liquids provided via: Cup;Straw Medication Administration: Whole meds with puree Supervision: Staff to assist with self feeding Compensations: Small sips/bites;Slow rate Postural Changes and/or Swallow Maneuvers: Seated upright 90 degrees;Upright 30-60 min after meal                   Oral care BID   None Dysphagia, oral phase (R13.11)     Continue with current plan of care    Sheri Infante, Sheri [facility] SLP Acute Rehab Services Office [phone]  Sheri Brown  08/31/2023, 1:27 PM

## 2023-08-31 NOTE — Evaluation (Signed)
Occupational Therapy Evaluation Patient Details Name: Sheri Brown MRN: [id number] DOB: Mar 21, 1936 Today's Date: 08/31/2023   History of Present Illness Sheri Brown is an 87 yr old female with a history of right hip arthroplasty ~2010, HFpEF, orthostatic hypotension who presented to the ED on 08/27/2023 with right hip pain, inability to ambulate, admitted by orthopedic surgery for failure of right  prosthesis, requiring revision on 08/29/23 of right with explant and revision of the acetabular component as well as the ball and liner, retention of the femoral stem.   Clinical Impression   The pt is currently presenting significantly below her baseline level of functioning for self-care management, given the below listed deficits (see OT problem list). At current, she requires significant physical assist for tasks, including bed mobility, dressing, and toileting. She required 2 person total assist to transfer to the EOB. She initially presented with poor sitting balance, however progressed to CGA. While EOB, she performed self-feeding, though needing significant hand-over-hand assist and cues for scooping and bringing food to her mouth. She presented with waxing and waning alertness throughout the session, requiring cues in this regard. OT will follow pt in the acute care setting. Patient will benefit from continued inpatient follow up therapy, <3 hours/day.       If plan is discharge home, recommend the following: Two people to help with walking and/or transfers;Two people to help with bathing/dressing/bathroom;Direct supervision/assist for medications management    Functional Status Assessment  Patient has had a recent decline in their functional status and demonstrates the ability to make significant improvements in function in a reasonable and predictable amount of time.  Equipment Recommendations  Other (comment) (defer to next level of care)    Recommendations for Other Services        Precautions / Restrictions Precautions Precautions: Posterior Hip Precaution Comments: right  leg internally rotated, repositioned to  neutral alignment with pillows Restrictions Weight Bearing Restrictions: Yes RLE Weight Bearing: Weight bearing as tolerated      Mobility Bed Mobility Overal bed mobility: Needs Assistance Bed Mobility: Supine to Sit, Sit to Supine Rolling: Total assist, +2 for physical assistance, +2 for safety/equipment   Supine to sit: Total assist, +2 for safety/equipment, +2 for physical assistance, HOB elevated Sit to supine: Total assist, +2 for safety/equipment, +2 for physical assistance   General bed mobility comments: pt  not able to assist with any mobility.    Transfers      General transfer comment: unable to safely assess      Balance       Sitting balance - Comments: initially poor, required support. Able to gain  balance and sit at midline for ~ 10 minutes, able to take in some food with assistnace           ADL either performed or assessed with clinical judgement   ADL Overall ADL's : Needs assistance/impaired Eating/Feeding: Sitting;Moderate assistance;Cueing for sequencing Eating/Feeding Details (indicate cue type and reason): Required mod hand-over-hand assist with cues for bringing utensil to mouth in sitting EOB. Pt's overall performance appeared to be limited by her waxing and waning arousal. Grooming: Maximal assistance;Bed level Grooming Details (indicate cue type and reason): Increased assist likely needed given pt's waxing and waning alertness.         Upper Body Dressing : Maximal assistance;Bed level   Lower Body Dressing: Total assistance;Bed level       Toileting- Clothing Manipulation and Hygiene: Total assistance;Bed level  Pertinent Vitals/Pain Pain Assessment Pain Assessment: Faces Pain Score: 5  Pain Location: RLE with activity Pain Intervention(s): Limited activity within patient's  tolerance, Monitored during session, Repositioned, Premedicated before session     Extremity/Trunk Assessment Upper Extremity Assessment RUE Deficits / Details: daughter reports patient  has OA in shoulder, does not use it very well, has become more left handed LUE Deficits / Details: chronic shoulder ROM limitations with arthritic changes of hands. Functional grip strength   Lower Extremity Assessment RLE Deficits / Details: limited active movement noted with poor arousal to follow directions, indicates pain in  leg with PROM, (per PT eval) LLE Deficits / Details: minimal active movement, does not follow directions, dtr reports "bad arthritis in  knee" (per PT eval)       Communication Communication Communication: Difficulty communicating thoughts/reduced clarity of speech;Difficulty following commands/understanding   Cognition Arousal: Lethargic Behavior During Therapy: Flat affect Overall Cognitive Status: Impaired/Different from baseline Area of Impairment: Orientation, Attention, Memory, Following commands, Awareness      Orientation Level: Disoriented to, Time, Situation, Place     Following Commands: Follows one step commands inconsistently       General Comments: patient is very lethargic, intermittent periods of arousal to converse, then  back to lethargy.                Home Living Family/patient expects to be discharged to:: Assisted living        Additional Comments: [facility]      Prior Functioning/Environment Prior Level of Function : Needs assist             Mobility Comments: Her prior therapy note, she ambulated without assist at the facility using a rollator. ADLs Comments: Per prior therapy note, she required assist for dressing tasks. She could feed herself.        OT Problem List: Decreased strength;Decreased range of motion;Decreased activity tolerance;Impaired balance (sitting and/or standing);Decreased coordination;Decreased  knowledge of precautions;Decreased knowledge of use of DME or AE;Pain;Impaired UE functional use      OT Treatment/Interventions: Self-care/ADL training;Therapeutic exercise;Energy conservation;DME and/or AE instruction;Therapeutic activities;Balance training;Patient/family education    OT Goals(Current goals can be found in the care plan section) Acute Rehab OT Goals Patient Stated Goal: pt did not specifically state OT Goal Formulation: Patient unable to participate in goal setting Time For Goal Achievement: 09/14/23 Potential to Achieve Goals:  (Guarded at current) ADL Goals Pt Will Perform Eating: with set-up;sitting Pt Will Perform Grooming: with set-up;with supervision;sitting Pt Will Transfer to Toilet: with min assist;bedside commode;stand pivot transfer Additional ADL Goal #1: The pt will complete bed mobility with min assist, in prep for progressive ADL participation.  OT Frequency: Min 1X/week    Co-evaluation   Reason for Co-Treatment: For patient/therapist safety;To address functional/ADL transfers PT goals addressed during session: Mobility/safety with mobility OT goals addressed during session: ADL's and self-care      AM-PAC OT "6 Clicks" Daily Activity     Outcome Measure Help from another person eating meals?: A Lot Help from another person taking care of personal grooming?: A Lot Help from another person toileting, which includes using toliet, bedpan, or urinal?: Total Help from another person bathing (including washing, rinsing, drying)?: A Lot Help from another person to put on and taking off regular upper body clothing?: A Lot Help from another person to put on and taking off regular lower body clothing?: Total 6 Click Score: 10   End of Session Equipment Utilized During Treatment: Other (comment)  Nurse Communication: Other (comment) (nurse cleared pt for therapy)  Activity Tolerance: Patient limited by lethargy Patient left: in bed;with call bell/phone  within reach;with bed alarm set  OT Visit Diagnosis: Muscle weakness (generalized) (M62.81);Pain;Other abnormalities of gait and mobility (R26.89) Pain - Right/Left: Right Pain - part of body: Leg                Time: 7253-6644 OT Time Calculation (min): 23 min Charges:  OT General Charges $OT Visit: 1 Visit OT Evaluation $OT Eval Moderate Complexity: 1 Mod    Ansley Stanwood L Caydin Yeatts, OTR/L 08/31/2023, 2:21 PM

## 2023-08-31 NOTE — Progress Notes (Signed)
[facility] PROGRESS NOTE  Beatrix Breece (DOB: 02-15-1936) ZOX:[id number] PCP: [name], PA-C  Brief Narrative: Sheri Brown is an 87 y.o. female with a history of right hip arthroplasty ~2010, HFpEF, orthostatic hypotension who presented to the ED on 08/27/2023 with right hip pain, inability to ambulate, admitted by orthopedic surgery for failure of prosthesis, requiring revision on 10/9.   Subjective: Much more alert, smiles and says good morning, says her pain is being managed well. No dyspnea cough or pain other than her leg.   Objective: BP (!) 145/71 (BP Location: Right Arm)   Pulse 75   Temp 98.1 F (36.7 C)   Resp 17   Ht 5\' 5"  (1.651 m)   Wt 61.2 kg   SpO2 100%   BMI 22.47 kg/m   Gen: Pleasant elderly female in no acute distress Pulm: No crackles or wheezes  CV: RRR, no edema or JVD GI: Soft, NT, ND, +BS  Neuro: Alert and interactive this morning. No new focal deficits. Ext: Warm, dry.  Skin: Wound vac lateral right LE stable.    Assessment & Plan: Right hip arthroplasty prosthesis failure:  - Orthopedics planning revision/explant today. Inflammatory markers not elevated, so not suggestive of PJI. Monitoring cultures while on first generation cephalosporin. - PT/OT > anticipate SNF requirement post discharge.  Dysphagia: May be more related to delirium, suspect hypoactive delirium and this has improved. - Continue liquids, likely can advance now that there's improved mentation.  - SLP evaluation appreciated  Chronic HFpEF: Discharge summary from [facility] admission 9/19-9/23 reviewed.  - Continue metoprolol, lasix 20mg  (will hold today given minor bump in creatinine and euvolemic status). Does not appear to have pulmonary or peripheral edema at this time. Will confirm ability to wean to room air, [employer].   Orthostatic hypotension:  - Midodrine was initiated during recent hospital stay, not a standing order. BP currently elevated, so not ordered  here at this time.    Stage IIIb CKD: At baseline.  - Avoid nephrotoxins, stable.  Postoperative blood loss anemia:  - Stable with no gross bleeding.  Leukocytosis: Afebrile, note she received decadron, no nidus for infection declaring itself, so will monitor off abx for now.  HTN:  - Continue metoprolol succinate  Thrombocytopenia:  - Down postoperatively, still >50k. Suggest recheck in AM or prn bleeding. Ok for ASA 81mg  BID for VTE ppx per primary.  Essential tremor: No Tx  Tyrone Nine, MD [facility] [email or web address] 08/31/2023, 10:17 AM

## 2023-08-31 NOTE — Progress Notes (Signed)
Physical Therapy Treatment Patient Details Name: Sheri Brown MRN: [id number] DOB: 1936-02-11 Today's Date: 08/31/2023   History of Present Illness Sheri Brown is an 87 y.o. female with a history of right hip arthroplasty ~2010, HFpEF, orthostatic hypotension who presented to the ED on 08/27/2023 with right hip pain, inability to ambulate, admitted by orthopedic surgery for failure of right  prosthesis, requiring revision on 08/29/23 of right with explant and revision of the acetabular component as well as the ball and liner, retention of the femoral stem.    PT Comments  The patient initially was alert and able to converse. Patient had intermittent periods on more lethargy.  Patient was premedicated with PO pain medication 1 hr. Prior.  Patient assisted to sitting onto bed edge with +2  total assistance. Once sitting, patient did gain balance and sat x ~ 20'. Family not present.  Assisted back into bed, patient  not ready to attempt standing. Continue PT for mobility. Patient will benefit from continued inpatient follow up therapy, <3 hours/day    If plan is discharge home, recommend the following: Two people to help with walking and/or transfers;Two people to help with bathing/dressing/bathroom;Assistance with feeding;Help with stairs or ramp for entrance;Assist for transportation   Can travel by private vehicle        Equipment Recommendations  None recommended by PT    Recommendations for Other Services       Precautions / Restrictions Precautions Precautions: Posterior Hip Precaution Comments: right  leg int. rotated, repositioned to  neutral alignment with pillows     Mobility  Bed Mobility Overal bed mobility: Needs Assistance Bed Mobility: Supine to Sit, Sit to Supine     Supine to sit: Total assist, +2 for safety/equipment, +2 for physical assistance, HOB elevated Sit to supine: Total assist, +2 for safety/equipment, +2 for physical assistance   General bed mobility  comments: pt  not able to assist with any mobility.    Transfers                   General transfer comment: NT    Ambulation/Gait                   Stairs             Wheelchair Mobility     Tilt Bed    Modified Rankin (Stroke Patients Only)       Balance Overall balance assessment: Needs assistance Sitting-balance support: No upper extremity supported, Feet supported Sitting balance-Leahy Scale: Poor Sitting balance - Comments: initially poor, required support. Able to gain  balance and sit at midline x ~ 10", able to take in some food with assistnace                                    Cognition Arousal: Lethargic Behavior During Therapy: Flat affect Overall Cognitive Status: Impaired/Different from baseline Area of Impairment: Orientation, Attention, Memory, Following commands, Awareness                 Orientation Level: Place, Time, Situation Current Attention Level: Focused   Following Commands: Follows one step commands inconsistently   Awareness: Intellectual   General Comments: patient is very lethargic, intermittent periods of arousal to converse, then  back to lethargy.        Exercises      General Comments        Pertinent Vitals/Pain  Pain Assessment Pain Assessment: PAINAD Breathing: occasional labored breathing, short period of hyperventilation Negative Vocalization: occasional moan/groan, low speech, negative/disapproving quality Facial Expression: sad, frightened, frown Body Language: tense, distressed pacing, fidgeting Consolability: distracted or reassured by voice/touch PAINAD Score: 5 Pain Intervention(s): Monitored during session, Premedicated before session, Repositioned    Home Living                          Prior Function            PT Goals (current goals can now be found in the care plan section) Progress towards PT goals: Progressing toward goals     Frequency    Min 1X/week      PT Plan      Co-evaluation PT/OT/SLP Co-Evaluation/Treatment: Yes Reason for Co-Treatment: Complexity of the patient's impairments (multi-system involvement);For patient/therapist safety;To address functional/ADL transfers PT goals addressed during session: Mobility/safety with mobility OT goals addressed during session: ADL's and self-care      AM-PAC PT "6 Clicks" Mobility   Outcome Measure  Help needed turning from your back to your side while in a flat bed without using bedrails?: Total Help needed moving from lying on your back to sitting on the side of a flat bed without using bedrails?: Total Help needed moving to and from a bed to a chair (including a wheelchair)?: Total Help needed standing up from a chair using your arms (e.g., wheelchair or bedside chair)?: Total Help needed to walk in hospital room?: Total Help needed climbing 3-5 steps with a railing? : Total 6 Click Score: 6    End of Session   Activity Tolerance: Patient limited by lethargy;Patient tolerated treatment well Patient left: in bed;with call bell/phone within reach;with bed alarm set Nurse Communication: Mobility status;Need for lift equipment PT Visit Diagnosis: Unsteadiness on feet (R26.81);History of falling (Z91.81);Difficulty in walking, not elsewhere classified (R26.2);Pain Pain - Right/Left: Right Pain - part of body: Hip     Time: 4098-1191 PT Time Calculation (min) (ACUTE ONLY): 24 min  Charges:    $Therapeutic Activity: 8-22 mins PT General Charges $$ ACUTE PT VISIT: 1 Visit                     Blanchard Kelch PT Acute Rehabilitation Services Office [phone] Weekend [phone]    Rada Hay 08/31/2023, 12:54 PM

## 2023-08-31 NOTE — Progress Notes (Signed)
     Subjective: Patient more interactive this AM. Pain described as mild-moderate, feels controlled.  Daughter present at bedside, states she's doing better than yesterday.  Has not mobilized with PT due to lethargy. Lying comfortably in bed.  Requesting ice packs for leg.  Laughed and smiled while assessing plantarflexion, patient reporting she's ticklish under her feet.  No other complaints.   Objective:   VITALS:   Vitals:   08/30/23 0920 08/30/23 1210 08/30/23 1958 08/31/23 0501  BP: (!) 142/74 (!) 151/76 134/65 (!) 145/71  Pulse: 82 80 80 75  Resp: 16 17 18 17   Temp: 98.4 F (36.9 C) 98.4 F (36.9 C) 98.7 F (37.1 C) 98.1 F (36.7 C)  TempSrc: Oral Oral Oral   SpO2: 99% 100% 100% 100%  Weight:      Height:        AAOx4, resting comfortably, mildly groggy appearing MSK: RLE Sensation intact distally Intact pulses distally, adequate perfusion Dorsiflexion/Plantar flexion intact, equal bilaterally Incision: dressing C/D/I Compartment soft Wiggles toes appropriately No significant difference in leg lengths Tolerates gentle small ROMs of right hip without severe pain Wound vac maintaining suction at setting, 0cc in cannister   Lab Results  Component Value Date   WBC 14.3 (H) 08/31/2023   HGB 9.7 (L) 08/31/2023   HCT 28.1 (L) 08/31/2023   MCV 83.6 08/31/2023   PLT 76 (L) 08/31/2023   BMET    Component Value Date/Time   NA 134 (L) 08/31/2023 0534   K 3.8 08/31/2023 0534   CL 99 08/31/2023 0534   CO2 24 08/31/2023 0534   GLUCOSE 117 (H) 08/31/2023 0534   BUN 19 08/31/2023 0534   CREATININE 1.27 (H) 08/31/2023 0534   CALCIUM 8.3 (L) 08/31/2023 0534   GFRNONAA 42 (L) 08/31/2023 0534      Xray: THA revision components in good position, no adverse features  Assessment/Plan: 2 Days Post-Op   Principal Problem:   Chronic hip pain after total replacement of right hip joint Active Problems:   Benign essential tremor   History of hyperlipidemia    Hypertension   Thrombocytopenia (HCC)   Stage 3b chronic kidney disease (HCC)  S/p R THA revision 08/29/23  08/31/23: Hgb 9.7, generally stable.  More interactive today.  Plan to mobilize with PT if able.  PT note 08/30/23 mentioned severe pain with RLE movement and internal rotation of RLE, no obvious rotation on exam today and tolerated gentle ROM without significant pain.  Discussed with Dr. Blanchie Dessert.  If difficulty mobilizing with PT, plan for repeat X-ray for further evaluation.  Anticipate likely SNF placement upon DC.  Post op recs: WB: WBAT RLE, posterior precautions x 6 weeks Abx: ancef while in-house, discharge on cefadroxil 500 mg twice daily x 7 days, follow up introap cxs: NGTD x3. Imaging: PACU pelvis Xray Dressing: Prevena wound VAC keep intact until follow-up DVT prophylaxis: Aspirin 81BID starting POD1 Follow up: 1 week after surgery for a wound check with Dr. Blanchie Dessert at [facility].  Address: [address], [place], [place] [address]  Office Phone: [phone]   Cecil Cobbs 08/31/2023, 7:03 AM   Weber Cooks, MD  Contact information:   [phone] 7am-5pm epic message Dr. Blanchie Dessert, or call office for patient follow up: [phone] After hours and holidays please check [email or web address] for group call information for [facility]

## 2023-08-31 NOTE — TOC Progression Note (Addendum)
Transition of Care [facility]) - Progression Note    Patient Details  Name: Sheri Brown MRN: [id number] Date of Birth: 1936-06-25  [facility]) CM/SW Contact  Otelia Santee, LCSW Phone Number: 08/31/2023, 12:18 PM  Clinical Narrative:    Attempted to meet with pt to discuss recommendation for SNF placement however, pt was lethargic and unable to participate in conversation. Will attempt to meet with pt at a later time.   ADDENDUM: Met with pt again however, pt continued to be lethargic and unable to participate in conversation. Pt provided verbal consent to speak with her daughter.  Spoke with Lauris Poag via t/c who shares pt is from ILF at [facility]. She shares that pt is able to get in-house therapy to come to her apartment. CSW reviewed pt's level of functioning during PT/OT sessions today. Pt's daughter prefers pt to return home but, understands at her current level she will need SNF. Preference for SNF is [facility]. Pt's daughter requesting to see how pt progresses tomorrow prior to sending out SNF referrals.   Expected Discharge Plan: Home w Home Health Services Barriers to Discharge: Continued Medical Work up  Expected Discharge Plan and Services In-house Referral: Clinical Social Work Discharge Planning Services: NA Post Acute Care Choice: Home Health, Resumption of Svcs/PTA Provider Living arrangements for the past 2 months: Independent Living Facility                 DME Arranged: N/A DME Agency: NA                   Social Determinants of Health (SDOH) Interventions SDOH Screenings   Food Insecurity: No Food Insecurity (08/27/2023)  Housing: Low Risk  (08/27/2023)  Transportation Needs: No Transportation Needs (08/27/2023)  Recent Concern: Transportation Needs - Unmet Transportation Needs (08/09/2023)   Received from [facility]  Utilities: Not At Risk (08/27/2023)  Tobacco Use: Low Risk  (08/29/2023)    Readmission Risk Interventions     08/28/2023   12:31 PM  Readmission Risk Prevention Plan  Post Dischage Appt Complete  Medication Screening Complete  Transportation Screening Complete

## 2023-08-31 NOTE — Plan of Care (Signed)

## 2023-09-01 DIAGNOSIS — Z96641 Presence of right artificial hip joint: Secondary | ICD-10-CM | POA: Diagnosis not present

## 2023-09-01 DIAGNOSIS — G8929 Other chronic pain: Secondary | ICD-10-CM | POA: Diagnosis not present

## 2023-09-01 DIAGNOSIS — M25551 Pain in right hip: Secondary | ICD-10-CM | POA: Diagnosis not present

## 2023-09-01 LAB — COMPREHENSIVE METABOLIC PANEL WITH GFR
ALT: 7 U/L (ref 0–44)
AST: 21 U/L (ref 15–41)
Albumin: 2.4 g/dL — ABNORMAL LOW (ref 3.5–5.0)
Alkaline Phosphatase: 33 U/L — ABNORMAL LOW (ref 38–126)
Anion gap: 8 (ref 5–15)
BUN: 25 mg/dL — ABNORMAL HIGH (ref 8–23)
CO2: 24 mmol/L (ref 22–32)
Calcium: 8.1 mg/dL — ABNORMAL LOW (ref 8.9–10.3)
Chloride: 101 mmol/L (ref 98–111)
Creatinine, Ser: 1.2 mg/dL — ABNORMAL HIGH (ref 0.44–1.00)
GFR, Estimated: 45 mL/min — ABNORMAL LOW
Glucose, Bld: 164 mg/dL — ABNORMAL HIGH (ref 70–99)
Potassium: 3.2 mmol/L — ABNORMAL LOW (ref 3.5–5.1)
Sodium: 133 mmol/L — ABNORMAL LOW (ref 135–145)
Total Bilirubin: 0.9 mg/dL (ref 0.3–1.2)
Total Protein: 6.3 g/dL — ABNORMAL LOW (ref 6.5–8.1)

## 2023-09-01 LAB — CBC WITH DIFFERENTIAL/PLATELET
Abs Immature Granulocytes: 0.07 K/uL (ref 0.00–0.07)
Basophils Absolute: 0 K/uL (ref 0.0–0.1)
Basophils Relative: 0 %
Eosinophils Absolute: 0.2 K/uL (ref 0.0–0.5)
Eosinophils Relative: 1 %
HCT: 24.3 % — ABNORMAL LOW (ref 36.0–46.0)
Hemoglobin: 8.1 g/dL — ABNORMAL LOW (ref 12.0–15.0)
Immature Granulocytes: 1 %
Lymphocytes Relative: 6 %
Lymphs Abs: 0.7 K/uL (ref 0.7–4.0)
MCH: 28.6 pg (ref 26.0–34.0)
MCHC: 33.3 g/dL (ref 30.0–36.0)
MCV: 85.9 fL (ref 80.0–100.0)
Monocytes Absolute: 0.9 K/uL (ref 0.1–1.0)
Monocytes Relative: 7 %
Neutro Abs: 10.1 K/uL — ABNORMAL HIGH (ref 1.7–7.7)
Neutrophils Relative %: 85 %
Platelets: 68 K/uL — ABNORMAL LOW (ref 150–400)
RBC: 2.83 MIL/uL — ABNORMAL LOW (ref 3.87–5.11)
RDW: 15.3 % (ref 11.5–15.5)
WBC: 11.9 K/uL — ABNORMAL HIGH (ref 4.0–10.5)
nRBC: 0 % (ref 0.0–0.2)

## 2023-09-01 NOTE — Progress Notes (Signed)
Physical Therapy Treatment Patient Details Name: Sheri Brown MRN: [id number] DOB: [birth year] Today's Date: 09/01/2023   History of Present Illness Sheri Brown is an 87 y.o. female with a history of right hip arthroplasty ~2010, HFpEF, orthostatic hypotension who presented to the ED on 08/27/2023 with right hip pain, inability to ambulate, admitted by orthopedic surgery for failure of right  prosthesis, requiring revision on 08/29/23 of right with explant and revision of the acetabular component as well as the ball and liner, retention of the femoral stem.    PT Comments  Sheri Brown is a 87 y.o. female POD3 s/p procedure above. Patient is now limited by functional impairments (see PT problem list below) and requires total assist for bed mobility and for transfers, attempted STS via RW and pt maxA+2, unable to straighten legs, returned to sitting, STS and SPT completed total dependent via STEDY, communicated to nursing staff. Reviewed posterior hip precautions and pt without recall. Patient instructed in exercise to facilitate ROM and circulation to manage edema, very little carryover of instructions.  Pt in recliner with call bell within reach, ice applied to R hip, BLE elevated and semi-reclined in chair, family present. Patient will benefit from continued skilled PT interventions to address impairments and progress towards PLOF. Acute PT will follow to progress mobility and stair training in preparation for safe discharge home.     If plan is discharge home, recommend the following: Two people to help with walking and/or transfers;Two people to help with bathing/dressing/bathroom;Assistance with feeding;Help with stairs or ramp for entrance;Assist for transportation   Can travel by private vehicle     No  Equipment Recommendations  None recommended by PT    Recommendations for Other Services       Precautions / Restrictions Precautions Precautions: Posterior Hip Precaution Booklet Issued:  Yes (comment) Precaution Comments: Reviewed, pt with no recall / carryover Restrictions Weight Bearing Restrictions: Yes RLE Weight Bearing: Weight bearing as tolerated     Mobility  Bed Mobility Overal bed mobility: Needs Assistance Bed Mobility: Supine to Sit     Supine to sit: Total assist, +2 for safety/equipment, +2 for physical assistance, HOB elevated     General bed mobility comments: pt  not able to assist with any mobility.    Transfers Overall transfer level: Needs assistance Equipment used: Standard walker, Ambulation equipment used Transfers: Sit to/from Stand, Bed to chair/wheelchair/BSC Sit to Stand: Max assist, +2 physical assistance, +2 safety/equipment, From elevated surface, Via lift equipment   Step pivot transfers: Total assist       General transfer comment: Pt required maxA+2 for lift assistance and anterior weight shift to lift buttocks off bed, pt's feet sliding despite grippy socks unable to block knees, returned to sitting position, with STEDY pt required ModA+2 with multimodal cuing and depenent for BUE placement on horizontal bar, pt crying out in pain, in recliner after transfer Transfer via Lift Equipment: Stedy  Ambulation/Gait                   Stairs             Wheelchair Mobility     Tilt Bed    Modified Rankin (Stroke Patients Only)       Balance Overall balance assessment: Needs assistance Sitting-balance support: No upper extremity supported, Feet supported Sitting balance-Leahy Scale: Poor Sitting balance - Comments: initially poor, required total assistance to support. Able to gain  balance and sit at midline x ~ 12"  Standing balance-Leahy Scale: Zero Standing balance comment: Requires external support                            Cognition Arousal: Lethargic Behavior During Therapy: Flat affect, WFL for tasks assessed/performed Overall Cognitive Status: Impaired/Different from  baseline Area of Impairment: Orientation, Attention, Memory, Following commands, Awareness                 Orientation Level: Disoriented to, Time, Situation, Place Current Attention Level: Focused Memory: Decreased recall of precautions Following Commands: Follows one step commands inconsistently   Awareness: Intellectual            Exercises      General Comments General comments (skin integrity, edema, etc.): Family present      Pertinent Vitals/Pain Pain Assessment Pain Assessment: PAINAD Pain Score: 5  Faces Pain Scale: No hurt Breathing: occasional labored breathing, short period of hyperventilation Negative Vocalization: occasional moan/groan, low speech, negative/disapproving quality Facial Expression: sad, frightened, frown Body Language: tense, distressed pacing, fidgeting Consolability: distracted or reassured by voice/touch PAINAD Score: 5 Pain Location: RLE with activity Pain Descriptors / Indicators: Operative site guarding Pain Intervention(s): Limited activity within patient's tolerance, Monitored during session, Repositioned, RN gave pain meds during session, Patient requesting pain meds-RN notified, Ice applied    Home Living                          Prior Function            PT Goals (current goals can now be found in the care plan section) Acute Rehab PT Goals Patient Stated Goal: to walk PT Goal Formulation: With family Time For Goal Achievement: 09/13/23 Potential to Achieve Goals: Fair Progress towards PT goals: Progressing toward goals    Frequency    Min 1X/week      PT Plan      Co-evaluation              AM-PAC PT "6 Clicks" Mobility   Outcome Measure  Help needed turning from your back to your side while in a flat bed without using bedrails?: Total Help needed moving from lying on your back to sitting on the side of a flat bed without using bedrails?: Total Help needed moving to and from a bed to a  chair (including a wheelchair)?: Total Help needed standing up from a chair using your arms (e.g., wheelchair or bedside chair)?: Total Help needed to walk in hospital room?: Total Help needed climbing 3-5 steps with a railing? : Total 6 Click Score: 6    End of Session Equipment Utilized During Treatment: Gait belt Activity Tolerance: Patient limited by pain Patient left: with call bell/phone within reach;in chair;with chair alarm set;with family/visitor present Nurse Communication: Mobility status;Need for lift equipment PT Visit Diagnosis: Unsteadiness on feet (R26.81);History of falling (Z91.81);Difficulty in walking, not elsewhere classified (R26.2);Pain Pain - Right/Left: Right Pain - part of body: Hip     Time: 0865-7846 PT Time Calculation (min) (ACUTE ONLY): 42 min  Charges:    $Therapeutic Activity: 38-52 mins PT General Charges $$ ACUTE PT VISIT: 1 Visit                     Jamesetta Geralds, PT, DPT [facility] Department Office: [phone]   Jamesetta Geralds 09/01/2023, 3:26 PM

## 2023-09-01 NOTE — Progress Notes (Signed)
[facility] PROGRESS NOTE  Sheri Brown (DOB: 1936-02-08) WUJ:[id number] PCP: Sheri Kill, PA-C  Brief Narrative: Sheri Brown is an 87 y.o. female with a history of right hip arthroplasty ~2010, HFpEF, orthostatic hypotension who presented to the ED on 08/27/2023 with right hip pain, inability to ambulate, admitted by orthopedic surgery for failure of prosthesis, requiring revision on 10/9.   Subjective: Alert again this AM, smiling, says my hands are cold. Sheri Brown again at bedside and other family called on speaker phone during encounter. No complaints.   Objective: BP (!) 155/75 (BP Location: Right Arm)   Pulse 76   Temp 98.5 F (36.9 C)   Resp 16   Ht 5\' 5"  (1.651 m)   Wt 61.2 kg   SpO2 97%   BMI 22.47 kg/m   Gen: Pleasant, alert elderly female in no distress Pulm: Clear, nonlabored  CV: RRR, no MRG or edema GI: Soft, NT, ND, +BS Neuro: Alert and interactive, cooperative with no noted focal deficits.   Assessment & Plan: Right hip arthroplasty prosthesis failure:  - Orthopedics planning revision/explant today. Inflammatory markers not elevated, so not suggestive of PJI. Monitoring cultures while on first generation cephalosporin. - PT/OT > anticipate SNF requirement post discharge.  Dysphagia: May be more related to delirium, suspect hypoactive delirium and this has improved. - Continue more advanced diet with her improved mentation.  - SLP evaluation appreciated  Chronic HFpEF: Discharge summary from [facility] admission 9/19-9/23 reviewed.  - Continue metoprolol. Remains euvolemic and likely eating/drinking less than usual, so will again hold lasix 20mg .  Orthostatic hypotension:  - Midodrine formerly given PTA, BP currently elevated, so not ordered here at this time.    Stage IIIb CKD: At baseline.  - Avoid nephrotoxins, stable. Recheck in AM if still here.  Postoperative blood loss anemia:  - Stable with no gross bleeding. Can recheck in AM if still  here.  Leukocytosis: Afebrile, Tmax 99.68F , note she received decadron, no nidus for infection declaring itself. She's on ancef regardless.  HTN:  - Continue metoprolol succinate  Thrombocytopenia:  - Down postoperatively, still >50k. Recheck in AM or prn bleeding. Ok for ASA 81mg  BID for VTE ppx per primary.  Essential tremor: No Tx  Sheri Nine, MD [facility] [email or web address] 09/01/2023, 9:02 AM

## 2023-09-01 NOTE — Progress Notes (Signed)
3 Days Post-Op Procedure(s) (LRB): TOTAL HIP REVISION (Right)  Subjective: Patient interactive this AM.  Pain feels controlled.  Slept good.  Lying comfortably in bed.  Daughter at bedside, reports patient ate all her breakfast today.  Laughing and smiling throughout encounter.  No other complaints.   Has not mobilized with yet PT today, but was able to participate some yesterday.  Still unable to stand with PT.  Patient appears motivated for PT today, daughter hopeful.   Objective:   VITALS:   Vitals:   08/31/23 0501 08/31/23 1323 08/31/23 2031 09/01/23 0545  BP: (!) 145/71 (!) 108/57 (!) 146/70 (!) 155/75  Pulse: 75 82 78 76  Resp: 17 15 16 16   Temp: 98.1 F (36.7 C) 99.8 F (37.7 C) 98.5 F (36.9 C) 98.5 F (36.9 C)  TempSrc:  Oral    SpO2: 100% 100% 96% 97%  Weight:      Height:        AAOx4, resting comfortably, interacts appropriately MSK: RLE Sensation intact distally Intact pulses distally, adequate perfusion Dorsiflexion/Plantar flexion intact, equal bilaterally Incision: dressing C/D/I Compartment soft Wiggles toes appropriately Severe valgus deformity bilateral knees, minimal internal rotation of right hip at rest, no significant difference in leg lengths when aligned together Shows fair muscle engagement for gentle ROM of hip Wound vac maintaining suction at setting, 0cc in cannister   Lab Results  Component Value Date   WBC 14.3 (H) 08/31/2023   HGB 9.7 (L) 08/31/2023   HCT 28.1 (L) 08/31/2023   MCV 83.6 08/31/2023   PLT 76 (L) 08/31/2023   BMET    Component Value Date/Time   NA 134 (L) 08/31/2023 0534   K 3.8 08/31/2023 0534   CL 99 08/31/2023 0534   CO2 24 08/31/2023 0534   GLUCOSE 117 (H) 08/31/2023 0534   BUN 19 08/31/2023 0534   CREATININE 1.27 (H) 08/31/2023 0534   CALCIUM 8.3 (L) 08/31/2023 0534   GFRNONAA 42 (L) 08/31/2023 0534     Xray: R hip and pelvis  08/29/23: THA revision components in good position, no  adverse features 08/31/23: Stable post-operative imaging, no evidence of dislocation or new adverse features   Assessment/Plan: 3 Days Post-Op   Principal Problem:   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  More interactive today.  Plan to mobilize with PT if able.  PT note 08/30/23 mentioned severe pain with RLE movement and internal rotation of RLE, no obvious rotation on exam today and tolerated gentle ROM without significant pain.  Discussed with Dr. Blanchie Dessert.  If difficulty mobilizing with PT, plan for repeat X-ray for further evaluation.  Anticipate likely SNF placement upon DC.  09/01/23: Labs pending.  Pt resting in bed, more interactive again today.  Increased appetite, smiling and laughing.  X-rays reassuring from yesterday.  No evidence of dislocation/complication.  Continue with PT/OT -- DC plan of SNF vs back to ILF with HHPT based PT/OT eval today.    Post op recs: WB: WBAT RLE, posterior precautions x 6 weeks Abx: ancef while in-house, discharge on cefadroxil 500 mg twice daily x 7 days, follow up introap cxs: NGTD x3. Imaging: PACU pelvis Xray Dressing: Prevena wound VAC keep intact until follow-up in 1  wk DVT prophylaxis: Aspirin 81BID starting POD1 Follow up: 1 week after surgery for a wound check with Dr. Blanchie Dessert at [facility].  Address: [address], [place], [place] [address]  Office Phone: [phone]   Cecil Cobbs 09/01/2023, 10:52 AM   Contact information:   Weekdays 7am-5pm epic message Dr. Blanchie Dessert, or call office for patient follow up: [phone] After hours and holidays please check [email or web address] for group call information for [facility]

## 2023-09-01 NOTE — Plan of Care (Signed)

## 2023-09-02 DIAGNOSIS — G8929 Other chronic pain: Secondary | ICD-10-CM | POA: Diagnosis not present

## 2023-09-02 DIAGNOSIS — Z96641 Presence of right artificial hip joint: Secondary | ICD-10-CM | POA: Diagnosis not present

## 2023-09-02 DIAGNOSIS — M25551 Pain in right hip: Secondary | ICD-10-CM | POA: Diagnosis not present

## 2023-09-02 LAB — CBC
HCT: 24.7 % — ABNORMAL LOW (ref 36.0–46.0)
Hemoglobin: 8.2 g/dL — ABNORMAL LOW (ref 12.0–15.0)
MCH: 28.4 pg (ref 26.0–34.0)
MCHC: 33.2 g/dL (ref 30.0–36.0)
MCV: 85.5 fL (ref 80.0–100.0)
Platelets: 84 10*3/uL — ABNORMAL LOW (ref 150–400)
RBC: 2.89 MIL/uL — ABNORMAL LOW (ref 3.87–5.11)
RDW: 15.4 % (ref 11.5–15.5)
WBC: 11.1 10*3/uL — ABNORMAL HIGH (ref 4.0–10.5)
nRBC: 0 % (ref 0.0–0.2)

## 2023-09-02 LAB — BASIC METABOLIC PANEL
Anion gap: 9 (ref 5–15)
BUN: 28 mg/dL — ABNORMAL HIGH (ref 8–23)
CO2: 26 mmol/L (ref 22–32)
Calcium: 8.3 mg/dL — ABNORMAL LOW (ref 8.9–10.3)
Chloride: 99 mmol/L (ref 98–111)
Creatinine, Ser: 1.24 mg/dL — ABNORMAL HIGH (ref 0.44–1.00)
GFR, Estimated: 43 mL/min — ABNORMAL LOW (ref >60)
Glucose, Bld: 113 mg/dL — ABNORMAL HIGH (ref 70–99)
Potassium: 3.7 mmol/L (ref 3.5–5.1)
Sodium: 134 mmol/L — ABNORMAL LOW (ref 135–145)

## 2023-09-02 NOTE — Progress Notes (Signed)
4 Days Post-Op Procedure(s) (LRB): TOTAL HIP REVISION (Right)  Subjective: Patient interactive this AM.  Reports pain as controlled.  Slept good.  Lying comfortably in bed.  Daughter not at bedside.  Laughing and smiling throughout encounter.    Noted a lot of pain with PT yesterday.  Still unable to stand with PT.  Patient appears motivated for PT today.  No other complaints.    Objective:   VITALS:   Vitals:   09/01/23 0545 09/01/23 1207 09/01/23 1943 09/02/23 0526  BP: (!) 155/75 (!) 123/59 (!) 123/58 139/72  Pulse: 76 76 74 62  Resp: 16 16 20 17   Temp: 98.5 F (36.9 C) 98.5 F (36.9 C) 97.7 F (36.5 C) 97.7 F (36.5 C)  TempSrc:  Oral Oral Oral  SpO2: 97% 97% 100% 98%  Weight:      Height:        AAOx4, resting comfortably, interacts appropriately MSK: RLE Sensation intact distally Intact pulses distally Dorsiflexion/Plantar flexion intact Incision: dressing C/D/I Compartment soft Wiggles toes appropriately Severe valgus deformity bilateral knees, minimal internal rotation of right hip at rest, no significant difference in leg lengths when aligned together, no bony deformity appreciated Wound vac maintaining suction at setting, 0cc in cannister   Lab Results  Component Value Date   WBC 11.1 (H) 09/02/2023   HGB 8.2 (L) 09/02/2023   HCT 24.7 (L) 09/02/2023   MCV 85.5 09/02/2023   PLT 84 (L) 09/02/2023   BMET    Component Value Date/Time   NA 134 (L) 09/02/2023 0545   K 3.7 09/02/2023 0545   CL 99 09/02/2023 0545   CO2 26 09/02/2023 0545   GLUCOSE 113 (H) 09/02/2023 0545   BUN 28 (H) 09/02/2023 0545   CREATININE 1.24 (H) 09/02/2023 0545   CALCIUM 8.3 (L) 09/02/2023 0545   GFRNONAA 43 (L) 09/02/2023 0545     Xray: R hip and pelvis  08/29/23: THA revision components in good position, no adverse features 08/31/23: Stable post-operative imaging, no evidence of dislocation or new adverse features   Assessment/Plan: 4 Days Post-Op    Principal Problem:   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  More interactive today.  Plan to mobilize with PT if able.  PT note 08/30/23 mentioned severe pain with RLE movement and internal rotation of RLE, no obvious rotation on exam today and tolerated gentle ROM without significant pain.  Discussed with Dr. Blanchie Dessert.  If difficulty mobilizing with PT, plan for repeat X-ray for further evaluation.  Anticipate likely SNF placement upon DC.  09/01/23: Labs pending.  Pt resting in bed, more interactive again today.  Increased appetite, smiling and laughing.  X-rays reassuring from yesterday.  No evidence of dislocation/complication.  Continue with PT/OT -- DC plan of SNF vs back to ILF with HHPT based PT/OT eval today.    09/02/23: Hgb stable around 8.2, likely post-operative anemia, though mildly anemic prior to surgery.  WBC 11.1.  Cr 1.24 and GFR 43, generally stable with her CKD.  Still difficulty with PT though pt hopeful.  Continue with PT/OT.   Post op recs: WB: WBAT RLE, posterior precautions x 6 weeks Abx: ancef while in-house, discharge on cefadroxil 500 mg twice daily x  7 days, follow up introap cxs: NGTD x3. Imaging: PACU pelvis Xray Dressing: Prevena wound VAC keep intact until follow-up in 1 wk DVT prophylaxis: Aspirin 81BID starting POD1 Follow up: 1 week after surgery for a wound check with Dr. Blanchie Dessert at [facility].  Address: [address], [place], [place] [address]  Office Phone: [phone]   Cecil Cobbs 09/02/2023, 8:22 AM   Contact information:   Weekdays 7am-5pm epic message Dr. Blanchie Dessert, or call office for patient follow up: [phone] After hours and holidays  please check [email or web address] for group call information for [facility]

## 2023-09-02 NOTE — [place] FL2 (Signed)
[place] MEDICAID FL2 LEVEL OF CARE FORM     IDENTIFICATION  Patient Name: Sheri Brown Birthdate: 1936-07-24 Sex: female Admission Date (Current Location): 08/27/2023  [facility] and [place] Number:  Producer, television/film/video and Address:  [facility],  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   Stage 3b chronic kidney disease (HCC) 11/01/2021   Orthostatic hypotension 03/18/2019   Vitamin D deficiency, unspecified 03/14/2019   Sickle cell trait (HCC) 03/10/2019   Thrombocytopenia (HCC) 03/10/2019   Weight loss 03/10/2019   Infected sebaceous cyst of skin 02/19/2019   Right inguinal hernia 09/07/2017   Benign essential tremor 08/24/2017   Memory difficulties 08/24/2017   Shortness of breath 08/07/2016   Anemia 03/06/2016   Dizziness 03/06/2016   History of hyperlipidemia 03/06/2016   Hypertension 03/06/2016    Orientation RESPIRATION BLADDER Height & Weight     Self, Time, Situation, Place  Normal Continent Weight: 135 lb (61.2 kg) Height:  5\' 5"  (165.1 cm)  BEHAVIORAL SYMPTOMS/MOOD NEUROLOGICAL BOWEL NUTRITION STATUS      Continent Diet (See discharge summary)  AMBULATORY STATUS COMMUNICATION OF NEEDS Skin   Extensive Assist Verbally Surgical wounds                       Personal Care Assistance Level of Assistance  Bathing, Feeding, Dressing Bathing Assistance: Maximum  assistance Feeding assistance: Limited assistance Dressing Assistance: Maximum assistance     Functional Limitations Info  Sight, Hearing, Speech Sight Info: Adequate Hearing Info: Impaired Speech Info: Adequate    SPECIAL CARE FACTORS FREQUENCY  PT (By licensed PT), OT (By licensed OT), Speech therapy     PT Frequency: 5x/wk OT Frequency: 5x/wk     Speech Therapy Frequency: 5x/wk      Contractures Contractures Info: Not present    Additional Factors Info  Code Status, Allergies Code Status Info: FULL Allergies Info: Aricept (Donepezil Hcl), Diazepam           Current Medications (09/02/2023):  This is the current hospital active medication list Current Facility-Administered Medications  Medication Dose Route Frequency Provider Last Rate Last Admin   acetaminophen (TYLENOL) tablet 650 mg  650 mg Oral Q6H PRN Cecil Cobbs, PA-C   650 mg at 09/01/23 2245   Or   acetaminophen (TYLENOL) suppository 650 mg  650 mg Rectal Q6H PRN Cecil Cobbs, PA-C       aspirin EC tablet 81 mg  81 mg Oral BID Joen Laura, MD   81 mg at 09/02/23 8413   cefadroxil (DURICEF) capsule 500 mg  500 mg Oral BID Kathie Dike M, PA-C   500 mg at 09/02/23 2440   Chlorhexidine Gluconate Cloth 2 % PADS 6 each  6 each Topical Daily Tyrone Nine, MD   6 each at 09/02/23 1027   docusate sodium (COLACE) capsule 100 mg  100 mg Oral BID Cecil Cobbs,  PA-C   100 mg at 09/02/23 1610   hydrALAZINE (APRESOLINE) injection 10 mg  10 mg Intravenous Q4H PRN Kathie Dike M, PA-C       HYDROmorphone (DILAUDID) injection 0.5-1 mg  0.5-1 mg Intravenous Q2H PRN Kathie Dike M, PA-C   1 mg at 09/01/23 2347   methocarbamol (ROBAXIN) tablet 500 mg  500 mg Oral Q6H PRN Joen Laura, MD   500 mg at 08/31/23 2236   metoprolol succinate (TOPROL-XL) 24 hr tablet 12.5 mg  12.5 mg Oral QHS Kathie Dike M, PA-C   12.5 mg at 09/01/23 2245   ondansetron (ZOFRAN) tablet 4 mg  4  mg Oral Q6H PRN Kathie Dike M, PA-C       Or   ondansetron [facility]) injection 4 mg  4 mg Intravenous Q6H PRN Kathie Dike M, PA-C   4 mg at 08/29/23 2009   oxyCODONE (Oxy IR/ROXICODONE) immediate release tablet 5-7.5 mg  5-7.5 mg Oral Q4H PRN Cecil Cobbs, PA-C   5 mg at 09/02/23 0931   polyethylene glycol (MIRALAX / GLYCOLAX) packet 17 g  17 g Oral Daily PRN Kathie Dike M, PA-C       sodium chloride flush (NS) 0.9 % injection 3 mL  3 mL Intravenous Q12H Kathie Dike M, PA-C   3 mL at 09/02/23 9604   sodium chloride flush (NS) 0.9 % injection 3 mL  3 mL Intravenous PRN Cecil Cobbs, PA-C   3 mL at 08/31/23 2236     Discharge Medications: Please see discharge summary for a list of discharge medications.  Relevant Imaging Results:  Relevant Lab Results:   Additional Information SSN: [id number]  Otelia Santee, LCSW

## 2023-09-02 NOTE — Progress Notes (Signed)
[facility] PROGRESS NOTE  Sheri Brown (DOB: 04/20/1936) [id number] PCP: [name], PA-C  Brief Narrative: Sheri Brown is an 87 y.o. female with a history of right hip arthroplasty ~2010, HFpEF, orthostatic hypotension who presented to the ED on 08/27/2023 with right hip pain, inability to ambulate, admitted by orthopedic surgery for failure of prosthesis, requiring revision on 10/9.   Subjective: No complaints this morning, waiting on breakfast.   Objective: BP 139/72 (BP Location: Right Arm)   Pulse 62   Temp 97.7 F (36.5 C) (Oral)   Resp 17   Ht 5\' 5"  (1.651 m)   Wt 61.2 kg   SpO2 98%   BMI 22.47 kg/m   Pleasant, alert, interactive elderly female in no distress. Clear lungs, no edema.   Assessment & Plan: Right hip arthroplasty prosthesis failure:  - Orthopedics planning revision/explant today. Inflammatory markers not elevated, so not suggestive of PJI. Monitoring cultures while on first generation cephalosporin. - PT/OT > anticipate SNF requirement post discharge.  Dysphagia: May be more related to delirium, suspect hypoactive delirium and this has improved. - Continue more advanced diet with her improved mentation.  - SLP evaluation appreciated  Chronic HFpEF: Discharge summary from [facility] admission 9/19-9/23 reviewed.  - Continue metoprolol. Remains euvolemic and likely eating/drinking less than usual, so will again hold lasix 20mg .  Orthostatic hypotension:  - Midodrine formerly given PTA, BP currently elevated, so not ordered here at this time.    Stage IIIb CKD: At baseline.  - Avoid nephrotoxins, stable. Recheck in AM if still here.  Postoperative blood loss anemia: No gross bleeding.  - Can recheck this next week at SNF. Hgb 8.2 and stable.   Leukocytosis: Afebrile, Tmax 99.63F , note she received decadron, no nidus for infection declaring itself. She's on ancef regardless.  HTN:  - Continue metoprolol succinate  Thrombocytopenia:   - Down postoperatively, still >50k. Seems to have reached nadir.Suggest recheck within the next week at SNF. Ok for ASA 81mg  BID for VTE ppx per primary.  Essential tremor: No Tx  Tyrone Nine, MD [facility] [email or web address] 09/02/2023, 1:45 PM

## 2023-09-02 NOTE — TOC Progression Note (Signed)
Transition of Care [facility]) - Progression Note    Patient Details  Name: Sheri Brown MRN: [id number] Date of Birth: 06/13/1936  [facility]) CM/SW Contact  Otelia Santee, LCSW Phone Number: 09/02/2023, 11:02 AM  Clinical Narrative:    Met with pt who was more alert than on past visits. Pt oriented x 4. CSW discussed recommendation for SNF placement. Pt agreeable to SNF placement and states her preference for facility is [facility]. SNF referrals have been faxed out and currently awaiting bed offers.      Expected Discharge Plan: Home w Home Health Services Barriers to Discharge: Continued Medical Work up  Expected Discharge Plan and Services In-house Referral: Clinical Social Work Discharge Planning Services: NA Post Acute Care Choice: Home Health, Resumption of Svcs/PTA Provider Living arrangements for the past 2 months: Independent Living Facility                 DME Arranged: N/A DME Agency: NA                   Social Determinants of Health (SDOH) Interventions SDOH Screenings   Food Insecurity: No Food Insecurity (08/27/2023)  Housing: Low Risk  (08/27/2023)  Transportation Needs: No Transportation Needs (08/27/2023)  Recent Concern: Transportation Needs - Unmet Transportation Needs (08/09/2023)   Received from [facility]  Utilities: Not At Risk (08/27/2023)  Tobacco Use: Low Risk  (08/29/2023)    Readmission Risk Interventions    08/28/2023   12:31 PM  Readmission Risk Prevention Plan  Post Dischage Appt Complete  Medication Screening Complete  Transportation Screening Complete

## 2023-09-02 NOTE — Plan of Care (Signed)

## 2023-09-03 DIAGNOSIS — Z96641 Presence of right artificial hip joint: Secondary | ICD-10-CM | POA: Diagnosis not present

## 2023-09-03 DIAGNOSIS — G8929 Other chronic pain: Secondary | ICD-10-CM | POA: Diagnosis not present

## 2023-09-03 DIAGNOSIS — M25551 Pain in right hip: Secondary | ICD-10-CM | POA: Diagnosis not present

## 2023-09-03 LAB — AEROBIC/ANAEROBIC CULTURE W GRAM STAIN (SURGICAL/DEEP WOUND)
Culture: NO GROWTH
Culture: NO GROWTH
Culture: NO GROWTH
Gram Stain: NONE SEEN
Gram Stain: NONE SEEN
Gram Stain: NONE SEEN

## 2023-09-03 NOTE — Plan of Care (Signed)
  Problem: Education: Goal: Knowledge of General Education information will improve Description: Including pain rating scale, medication(s)/side effects and non-pharmacologic comfort measures Outcome: Progressing   Problem: Education: Goal: Knowledge of General Education information will improve Description: Including pain rating scale, medication(s)/side effects and non-pharmacologic comfort measures Outcome: Progressing   Problem: Clinical Measurements: Goal: Ability to maintain clinical measurements within normal limits will improve Outcome: Progressing Goal: Will remain free from infection Outcome: Progressing Goal: Diagnostic test results will improve Outcome: Progressing Goal: Respiratory complications will improve Outcome: Progressing Goal: Cardiovascular complication will be avoided Outcome: Progressing   Problem: Elimination: Goal: Will not experience complications related to bowel motility Outcome: Progressing Goal: Will not experience complications related to urinary retention Outcome: Progressing   Problem: Pain Managment: Goal: General experience of comfort will improve Outcome: Progressing   Problem: Safety: Goal: Ability to remain free from injury will improve Outcome: Progressing   Problem: Skin Integrity: Goal: Risk for impaired skin integrity will decrease Outcome: Progressing

## 2023-09-03 NOTE — Progress Notes (Signed)
Speech Language Pathology Treatment: Dysphagia  Patient Details Name: Sheri Brown MRN: [id number] DOB: 1936-11-09 Today's Date: 09/03/2023 Time: 4098-1191 SLP Time Calculation (min) (ACUTE ONLY): 18 min  Assessment / Plan / Recommendation Clinical Impression  Patient seen by SLP for skilled intervention focused on advanced PO trials. Patient was awake and asking where her lunch was when SLP entered the room. She remained awake, alert, and cooperative throughout the session. Patient was directly observed with thin liquid via straw, puree via spoon, and regular solid food. No overt s/sx of dysphagia were present. No instances of oral holding of either puree or solid food. Oral cavity clearance was achieved with minimal cueing and one straw sip of water. SLP recommending advance patient diet to regular with thin liquids. Patient must be awake and alert enough to maintain attention to POs. Medications may be administered with puree as needed. No further ST indicated at this time.   HPI HPI: Pt is an 87 yo female presenting 10/7  with R hip pain. She was admitted for failure of prosthesis, requiring revision 10/9. Pt was observed to have oral holding and some coughing with POs on 10/10. Pt had a previous clinical swallow eval at outside facility in September 2023 that was [facility]. PMH includes: GERD, memory difficulties, R hip arthroplasty ~ 2010, HFpEF, orthostatic hypotension      SLP Plan  All goals met;Discharge SLP treatment due to (comment) (goals met)      Recommendations for follow up therapy are one component of a multi-disciplinary discharge planning process, led by the attending physician.  Recommendations may be updated based on patient status, additional functional criteria and insurance authorization.    Recommendations  Diet recommendations: Regular;Thin liquid Liquids provided via: Cup;Straw Medication Administration: Whole meds with puree (As needed) Supervision: Staff to assist  with self feeding Compensations: Small sips/bites;Slow rate;Minimize environmental distractions Postural Changes and/or Swallow Maneuvers: Seated upright 90 degrees;Upright 30-60 min after meal                  Oral care BID   None Dysphagia, oral phase (R13.11)     All goals met;Discharge SLP treatment due to (comment) (goals met)     Marline Backbone, B.S., Speech Therapy Student   09/03/2023, 2:56 PM

## 2023-09-03 NOTE — Progress Notes (Signed)
[facility] PROGRESS NOTE  Sheri Brown (DOB: February 16, 1936) WGN:[id number] PCP: Sheri Kill, PA-C  Brief Narrative: Sheri Brown is an 87 y.o. female with a history of right hip arthroplasty ~2010, HFpEF, orthostatic hypotension who presented to the ED on 08/27/2023 with right hip pain, inability to ambulate, admitted by orthopedic surgery for failure of prosthesis, requiring revision on 10/9.   Subjective: No complaints, working with therapy, wants to eat breakfast, requesting assistance.   Objective: BP 129/73 (BP Location: Left Arm)   Pulse 66   Temp 99.2 F (37.3 C) (Oral)   Resp 14   Ht 5\' 5"  (1.651 m)   Wt 61.2 kg   SpO2 96%   BMI 22.47 kg/m   Pleasant, alert, oriented, no distress, clear lungs.   Assessment & Plan: Right hip arthroplasty prosthesis failure: s/p revision/explant. Inflammatory markers not elevated, so not suggestive of PJI. Monitoring cultures while on first generation cephalosporin. - Postoperative care per primary, planning SNF  Dysphagia: May be more related to delirium, suspect hypoactive delirium and this has improved. - SLP evaluation appreciated  Chronic HFpEF: Discharge summary from [facility] 9/19-9/23 reviewed.  - Continue metoprolol. Remains euvolemic and likely eating/drinking less than usual, so will again hold lasix 20mg .  Orthostatic hypotension:  - Midodrine formerly given PTA, BP currently elevated, so not ordered here at this time.    Stage IIIb CKD: At baseline.  - Avoid nephrotoxins. SCr stable.   Postoperative blood loss anemia: No gross bleeding.  - Can recheck this next week at SNF. Hgb 8.2 and stable.   Leukocytosis: Afebrile, Tmax 99.18F , note she received decadron, no nidus for infection declaring itself. She's on ancef regardless.  HTN:  - Continue metoprolol succinate  Thrombocytopenia: Seems to have reached nadir, no bleeding. Suggest recheck within the next week at SNF. Ok for ASA 81mg  BID for VTE  ppx per primary.  Essential tremor: No Tx  Sheri Nine, MD [facility] [email or web address] 09/03/2023, 10:31 AM

## 2023-09-03 NOTE — Progress Notes (Signed)
Physical Therapy Treatment Patient Details Name: Sheri Brown MRN: [id number] DOB: [birth year] Today's Date: 09/03/2023   History of Present Illness Sheri Brown is an 87 y.o. female with a history of right hip arthroplasty ~2010, HFpEF, orthostatic hypotension who presented to the ED on 08/27/2023 with right hip pain, inability to ambulate, admitted by orthopedic surgery for failure of right  prosthesis, requiring revision on 08/29/23 of right with explant and revision of the acetabular component as well as the ball and liner, retention of the femoral stem.    PT Comments  Pt lethargic, does awaken to cues but needing constant cues with mobility to maintain attention and alertness. Pt performs exercises with multimodal cues, therapist assisting as needed. Pt needing significant assistance with bed mobility, minimally activating both LE. Pt constantly denies pain, but with facial wincing with mobility. Pt appears to prefer R internally rotated LE posture, assisted to position into more neutral hip posture once supine in bed. Pt needing +2 to assist with bed mobility and reposition in bed. Once seated EOB, pt becomes dizzy and with headache, BP noted 96/51 so assisted back to supine and BP 125/72 and all symptoms resolved- RN notified. Patient will benefit from continued inpatient follow up therapy, <3 hours/day.   If plan is discharge home, recommend the following: Two people to help with walking and/or transfers;Two people to help with bathing/dressing/bathroom;Assistance with feeding;Help with stairs or ramp for entrance;Assist for transportation   Can travel by private vehicle     No  Equipment Recommendations  None recommended by PT    Recommendations for Other Services       Precautions / Restrictions Precautions Precautions: Posterior Hip Precaution Comments: reviewed posterior hip precautions, pt recalls 0/3 Restrictions Weight Bearing Restrictions: No RLE Weight Bearing: Weight  bearing as tolerated     Mobility  Bed Mobility Overal bed mobility: Needs Assistance Bed Mobility: Supine to Sit, Sit to Supine     Supine to sit: Max assist, +2 for physical assistance, +2 for safety/equipment Sit to supine: Total assist, +2 for physical assistance, +2 for safety/equipment   General bed mobility comments: pt inches LLE towards EOB, ultimately max A+2 to come to sitting EOB and then return to supine, verbal cues for posterior hip precautions with movement and sequencing and UE placement to assist as able; pt needing total A with use of bedpad and bed assist to scoot up in bed and reposition to comfort; pt prefers RLE internal rotation posture needing cues and assist to come to more neutral hip posture    Transfers                   General transfer comment: unable, pt becomes dizzy and with headache while seated EOB, BP 96/51 so assisted back to supine; all symptoms resolved and BP 125/72    Ambulation/Gait                   Stairs             Wheelchair Mobility     Tilt Bed    Modified Rankin (Stroke Patients Only)       Balance Overall balance assessment: Needs assistance Sitting-balance support: Feet supported, Bilateral upper extremity supported Sitting balance-Leahy Scale: Poor Sitting balance - Comments: SBA to min A for sitting EOB  Cognition Arousal: Lethargic Behavior During Therapy: Flat affect, WFL for tasks assessed/performed Overall Cognitive Status: No family/caregiver present to determine baseline cognitive functioning                                 General Comments: pt needing constant verbal cues for attention to task and constant stimulation to maintain arousal, follows 1 step commands with increasead time and cues, intermittently needing redirection to task at hand        Exercises Total Joint Exercises Ankle Circles/Pumps: Supine, AROM,  Right, 10 reps Quad Sets: Supine, AROM, Strengthening, Right, 10 reps (tactile cue with hand located at posterior knee encouraging pt to push down and hold) Hip ABduction/ADduction: Supine, AAROM, Strengthening, Right, 10 reps Straight Leg Raises: Supine, AAROM, Strengthening, Right, 10 reps    General Comments        Pertinent Vitals/Pain Pain Assessment Pain Assessment: Faces Faces Pain Scale: Hurts a little bit Pain Location: pt denies pain but with facial wincing/guarding wtih movement Pain Descriptors / Indicators: Operative site guarding Pain Intervention(s): Limited activity within patient's tolerance, Monitored during session, Repositioned    Home Living                          Prior Function            PT Goals (current goals can now be found in the care plan section) Acute Rehab PT Goals Patient Stated Goal: to walk PT Goal Formulation: With family Time For Goal Achievement: 09/13/23 Potential to Achieve Goals: Fair Progress towards PT goals: Progressing toward goals    Frequency    Min 1X/week      PT Plan      Co-evaluation              AM-PAC PT "6 Clicks" Mobility   Outcome Measure  Help needed turning from your back to your side while in a flat bed without using bedrails?: Total Help needed moving from lying on your back to sitting on the side of a flat bed without using bedrails?: Total Help needed moving to and from a bed to a chair (including a wheelchair)?: Total Help needed standing up from a chair using your arms (e.g., wheelchair or bedside chair)?: Total Help needed to walk in hospital room?: Total Help needed climbing 3-5 steps with a railing? : Total 6 Click Score: 6    End of Session Equipment Utilized During Treatment: Gait belt Activity Tolerance: Treatment limited secondary to medical complications (Comment) (dizziness, headache, decrease BP) Patient left: with call bell/phone within reach;in chair;with chair  alarm set;with family/visitor present Nurse Communication: Mobility status PT Visit Diagnosis: Unsteadiness on feet (R26.81);History of falling (Z91.81);Difficulty in walking, not elsewhere classified (R26.2);Pain Pain - Right/Left: Right Pain - part of body: Hip     Time: 1110-1154 PT Time Calculation (min) (ACUTE ONLY): 44 min  Charges:    $Therapeutic Exercise: 8-22 mins $Therapeutic Activity: 23-37 mins PT General Charges $$ ACUTE PT VISIT: 1 Visit                     Tori Kezia Benevides PT, DPT 09/03/23, 12:57 PM

## 2023-09-03 NOTE — Progress Notes (Signed)
5 Days Post-Op Procedure(s) (LRB): TOTAL HIP REVISION (Right)  Subjective: Patient interactive this AM.  Reports pain as controlled.  Slept okay.  Lying comfortably in bed.  Already had breakfast.  Daughter not at bedside.  Laughing and smiling throughout encounter.    Did not meet with PT yesterday.  Possible recommendation of SNF upon DC.  Appears motivated for PT today.  No other complaints.    Objective:   VITALS:   Vitals:   09/02/23 0526 09/02/23 1417 09/02/23 1958 09/03/23 0503  BP: 139/72 132/75 (!) 145/78 129/73  Pulse: 62 67 65 66  Resp: 17 14    Temp: 97.7 F (36.5 C) 98.5 F (36.9 C) 98.7 F (37.1 C) 99.2 F (37.3 C)  TempSrc: Oral  Oral Oral  SpO2: 98% 98%  96%  Weight:      Height:        AAOx4, resting comfortably, interacts appropriately MSK: RLE Sensation intact distally Intact pulses distally Dorsiflexion/Plantar flexion intact Incision: dressing C/D/I Compartment soft Wiggles toes appropriately Severe valgus deformity bilateral knees, minimal internal rotation of right hip at rest, no significant difference in leg lengths when aligned together, no bony deformity appreciated Tolerates gentle ROM of right hip without significant elicited pain Wound vac maintaining suction at setting, 0cc in cannister   Lab Results  Component Value Date   WBC 11.1 (H) 09/02/2023   HGB 8.2 (L) 09/02/2023   HCT 24.7 (L) 09/02/2023   MCV 85.5 09/02/2023   PLT 84 (L) 09/02/2023   BMET    Component Value Date/Time   NA 134 (L) 09/02/2023 0545   K 3.7 09/02/2023 0545   CL 99 09/02/2023 0545   CO2 26 09/02/2023 0545   GLUCOSE 113 (H) 09/02/2023 0545   BUN 28 (H) 09/02/2023 0545   CREATININE 1.24 (H) 09/02/2023 0545   CALCIUM 8.3 (L) 09/02/2023 0545   GFRNONAA 43 (L) 09/02/2023 0545     Xray: R hip and pelvis  08/29/23: THA revision components in good position, no adverse features 08/31/23: Stable post-operative imaging, no evidence of  dislocation or new adverse features   Assessment/Plan: 5 Days Post-Op   Principal Problem:   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  More interactive today.  Plan to mobilize with PT if able.  PT note 08/30/23 mentioned severe pain with RLE movement and internal rotation of RLE, no obvious rotation on exam today and tolerated gentle ROM without significant pain.  Discussed with Dr. Blanchie Dessert.  If difficulty mobilizing with PT, plan for repeat X-ray for further evaluation.  Anticipate likely SNF placement upon DC.  09/01/23: Labs pending.  Pt resting in bed, more interactive again today.  Increased appetite, smiling and laughing.  X-rays reassuring from yesterday.  No evidence of dislocation/complication.  Continue with PT/OT -- DC plan of SNF vs back to ILF with HHPT based PT/OT eval today.    09/02/23: Hgb stable around 8.2, likely post-operative anemia, though mildly anemic prior to surgery.  WBC 11.1.  Cr 1.24 and GFR 43, generally stable with her CKD.  Still difficulty with PT though pt hopeful.  Continue with PT/OT.  09/03/23: Unable to meet with PT/OT yesterday, hopeful for today.  Likely recommendation of SNF  upon DC.   Post op recs: WB: WBAT RLE, posterior precautions x 6 weeks Abx: ancef while in-house, discharge on cefadroxil 500 mg twice daily x 7 days, follow up introap cxs: NGTD x3. Imaging: PACU pelvis Xray Dressing: Prevena wound VAC keep intact until follow-up in 1 wk DVT prophylaxis: Aspirin 81BID starting POD1 Follow up: 1 week after surgery for a wound check with Dr. Blanchie Dessert at [facility].  Address: [address], [place], [place] [address]  Office Phone: [phone]   Cecil Cobbs 09/03/2023, 7:24 AM   Contact information:   Weekdays 7am-5pm epic message Dr. Blanchie Dessert, or call office for patient follow up: [phone] After hours and holidays please check [email or web address] for group call information for [facility]

## 2023-09-03 NOTE — TOC Progression Note (Addendum)
Transition of Care [facility]) - Progression Note    Patient Details  Name: Sheri Brown MRN: [id number] Date of Birth: 02-01-1936  [facility] CM/SW Contact  Otelia Santee, LCSW Phone Number: 09/03/2023, 12:49 PM  Clinical Narrative:    Met with pt who was oriented x 4 however, very drowsy and often dozed off throughout conversation.  Spoke with pt's daughter Lauris Poag over the phone to review bed offers for SNF. Pt's daughter accepted offer for [facility].  Unable to request insurance auth on [facility] as it is no longer managed by [facility]. Contacted Nikki at [facility] who will start insurance auth for this pt.    Expected Discharge Plan: Home w Home Health Services Barriers to Discharge: Continued Medical Work up  Expected Discharge Plan and Services In-house Referral: Clinical Social Work Discharge Planning Services: NA Post Acute Care Choice: Home Health, Resumption of Svcs/PTA Provider Living arrangements for the past 2 months: Independent Living Facility                 DME Arranged: N/A DME Agency: NA                   Social Determinants of Health (SDOH) Interventions SDOH Screenings   Food Insecurity: No Food Insecurity (08/27/2023)  Housing: Low Risk  (08/27/2023)  Transportation Needs: No Transportation Needs (08/27/2023)  Recent Concern: Transportation Needs - Unmet Transportation Needs (08/09/2023)   Received from [facility]  Utilities: Not At Risk (08/27/2023)  Tobacco Use: Low Risk  (08/29/2023)    Readmission Risk Interventions    08/28/2023   12:31 PM  Readmission Risk Prevention Plan  Post Dischage Appt Complete  Medication Screening Complete  Transportation Screening Complete

## 2023-09-04 DIAGNOSIS — M25551 Pain in right hip: Secondary | ICD-10-CM | POA: Diagnosis not present

## 2023-09-04 DIAGNOSIS — Z96641 Presence of right artificial hip joint: Secondary | ICD-10-CM | POA: Diagnosis not present

## 2023-09-04 DIAGNOSIS — G8929 Other chronic pain: Secondary | ICD-10-CM | POA: Diagnosis not present

## 2023-09-04 MED ORDER — MIDODRINE HCL 2.5 MG PO TABS: 2.5000 mg | ORAL_TABLET | Freq: Two times a day (BID) | ORAL | Status: AC | PRN

## 2023-09-04 NOTE — Plan of Care (Signed)

## 2023-09-04 NOTE — Plan of Care (Signed)
  Problem: Clinical Measurements: Goal: Will remain free from infection Outcome: Progressing Goal: Diagnostic test results will improve Outcome: Progressing   Problem: Activity: Goal: Risk for activity intolerance will decrease Outcome: Progressing   Problem: Nutrition: Goal: Adequate nutrition will be maintained Outcome: Progressing   Problem: Elimination: Goal: Will not experience complications related to bowel motility Outcome: Progressing Goal: Will not experience complications related to urinary retention Outcome: Progressing   Problem: Pain Managment: Goal: General experience of comfort will improve Outcome: Progressing   Problem: Safety: Goal: Ability to remain free from injury will improve Outcome: Progressing

## 2023-09-04 NOTE — Progress Notes (Signed)
[facility] PROGRESS NOTE  Sheri Brown (DOB: [birth year]) ZOX:[id number] PCP: [name], PA-C  Brief Narrative: Sheri Brown is an 87 y.o. female with a history of right hip arthroplasty ~2010, HFpEF, orthostatic hypotension who presented to the ED on 08/27/2023 with right hip pain, inability to ambulate, admitted by orthopedic surgery for failure of prosthesis, requiring revision on 10/9.   Note that she is remaining stable, plan for discharge to SNF per orthopedics, just waiting on insurance authorization. No new recommendations. Hospitalist service will sign off. Please call back if any questions arise.   Subjective: No complaints, ready to go to [facility].  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 Inflammatory markers not elevated, so not suggestive of PJI. Monitoring cultures while on first generation cephalosporin. - Postoperative care per primary, planning SNF  Dysphagia: May be more related to delirium, suspect hypoactive delirium and this has improved. - SLP evaluation appreciated, continue dysphagia diet  Chronic HFpEF: Discharge summary from [facility] admission 9/19-9/23 reviewed.  - Would recommend continuing medications as they were PTA at discharge.   Orthostatic hypotension:  - Midodrine formerly given PTA, BP currently elevated, so I've changed DC order to BID prn.  Stage IIIb CKD: At baseline.  - Avoid nephrotoxins. SCr stable.   Postoperative blood loss anemia: No gross bleeding.  - Can recheck this next week at SNF. Hgb 8.2 and stable.   Leukocytosis: Afebrile, Tmax 99.85F , note she received decadron, no nidus for infection declaring itself. She's on ancef regardless.  HTN:  -  Continue metoprolol succinate  Thrombocytopenia: Seems to have reached nadir, no bleeding. Suggest recheck within the next week at SNF. Ok for ASA 81mg  BID for VTE ppx per primary.  Essential tremor: No Tx  Tyrone Nine, MD [facility] [email or web address] 09/04/2023, 9:51 AM

## 2023-09-04 NOTE — Progress Notes (Signed)
Occupational Therapy Treatment Patient Details Name: Sheri Brown MRN: [id number] DOB: 1936-10-01 Today's Date: 09/04/2023   History of present illness Sheri Brown is an 87 yr old female with a history of right hip arthroplasty ~2010, HFpEF, orthostatic hypotension who presented to the ED on 08/27/2023 with right hip pain, inability to ambulate, admitted by orthopedic surgery for failure of right  prosthesis, requiring revision on 08/29/23 of right with explant and revision of the acetabular component as well as the ball and liner, retention of the femoral stem.   OT comments  Pt was seen for functional strengthening, progression of functional activity, and ADL participation. She required 2 person assist for bed mobility. She initially presented with poor sitting balance EOB, however progressed to SBA to CGA after a couple minutes. She further performed upper body grooming seated EOB, then required 2 person assist to stand using a RW. She stood two times, demonstrating a standing tolerance of ~45 seconds to 1 minute during each stand. She was unable to lift her BLE in order to take lateral steps along the EOB. Continue OT plan of care. Patient will benefit from continued inpatient follow up therapy, <3 hours/day       If plan is discharge home, recommend the following:  A lot of help with walking and/or transfers;A lot of help with bathing/dressing/bathroom;Direct supervision/assist for medications management   Equipment Recommendations  Other (comment) (defer to next level of care)    Recommendations for Other Services      Precautions / Restrictions Precautions Precautions: Posterior Hip Precaution Booklet Issued: Yes (comment) Precaution Comments: reviewed posterior hip precautions Restrictions Weight Bearing Restrictions: No RLE Weight Bearing: Weight bearing as tolerated       Mobility Bed Mobility Overal bed mobility: Needs Assistance Bed Mobility: Supine to Sit, Sit to  Supine     Supine to sit: Max assist, +2 for physical assistance, HOB elevated, Used rails Sit to supine: Max assist   General bed mobility comments: pt required instruction on advancing BLE, trunk shifting, and using bed rail as needed    Transfers Overall transfer level: Needs assistance   Transfers: Sit to/from Stand Sit to Stand: Mod assist, Max assist, +2 physical assistance, From elevated surface           General transfer comment: Pt performed 2 sit to stand transfers from EOB using a RW, requiring mod-max assist x2 with instruction on BLE positioning on the floor, pushing up from the bed with at least 1 upper extremity, and trunk extension in standing. She maintained static standing for ~45 seconds-1 minute during each stand, however she was unable to lift her BLE in order to take lateral steps along the EOB         ADL either performed or assessed with clinical judgement   ADL Overall ADL's : Needs assistance/impaired Eating/Feeding: Set up;Sitting   Grooming: Minimal assistance;Sitting Grooming Details (indicate cue type and reason): She performed face washing and teeth brushing seated EOB.         Upper Body Dressing : Moderate assistance;Sitting Upper Body Dressing Details (indicate cue type and reason): simulated Lower Body Dressing: Total assistance Lower Body Dressing Details (indicate cue type and reason): for sock management seated EOB     Toileting- Clothing Manipulation and Hygiene: Maximal assistance;Total assistance               Cognition Arousal: Alert Behavior During Therapy: WFL for tasks assessed/performed Overall Cognitive Status: No family/caregiver present to determine baseline cognitive functioning  Pertinent Vitals/ Pain       Pain Assessment Pain Assessment: Faces Pain Score: 5  Pain Location: R hip with activity Pain Intervention(s): Limited activity within patient's tolerance, Monitored during  session, Repositioned         Frequency  Min 1X/week        Progress Toward Goals  OT Goals(current goals can now be found in the care plan section)     Acute Rehab OT Goals OT Goal Formulation: With patient Time For Goal Achievement: 09/14/23 Potential to Achieve Goals: Good  Plan         AM-PAC OT "6 Clicks" Daily Activity     Outcome Measure   Help from another person eating meals?: A Little Help from another person taking care of personal grooming?: A Little Help from another person toileting, which includes using toliet, bedpan, or urinal?: A Lot Help from another person bathing (including washing, rinsing, drying)?: A Lot Help from another person to put on and taking off regular upper body clothing?: A Lot Help from another person to put on and taking off regular lower body clothing?: Total 6 Click Score: 13    End of Session Equipment Utilized During Treatment: Rolling walker (2 wheels);Gait belt  OT Visit Diagnosis: Muscle weakness (generalized) (M62.81);Pain;Other abnormalities of gait and mobility (R26.89) Pain - Right/Left: Right Pain - part of body: Leg   Activity Tolerance Patient limited by pain   Patient Left in bed;with call bell/phone within reach;with bed alarm set   Nurse Communication Mobility status        Time: 3875-6433 OT Time Calculation (min): 23 min  Charges: OT General Charges $OT Visit: 1 Visit OT Treatments $Self Care/Home Management : 8-22 mins $Therapeutic Activity: 8-22 mins    Reuben Likes, OTR/L 09/04/2023, 3:16 PM

## 2023-09-04 NOTE — TOC Progression Note (Addendum)
Transition of Care [facility]) - Progression Note    Patient Details  Name: Sheri Brown MRN: [id number] Date of Birth: 23-Dec-1935  [facility] CM/SW Contact  Otelia Santee, LCSW Phone Number: 09/04/2023, 10:11 AM  Clinical Narrative:    [facility] having difficulty starting insurance authorization as MCR unable to find pt's plan.  [facility] system currently down. CSW will attempt to start insurance auth once [facility] is back up.   ADDENDUM: Unable to submit for insurance auth on [employer]. Per [employer] pt's plan is no longer managed by [facility] as of 9/1. Pt's plan was found to be managed by [place] P[employer] vs [employer] side. Spoke with [employer] w/ [employer] who shared that pt's authorizations are managed by [employer]. CSW unable to submit insurance auth in [place] as there is no selection for [facility].  Spoke with Lowella Bandy at [employer] and provided her with number and fax provided to CSW by [employer] (P: [phone]; F:[phone]). Per Lowella Bandy she had called this number earlier and was told that pt's plan was managed by [facility]. Nikki to call again to attempt to submit for auth.    Expected Discharge Plan: Home w Home Health Services Barriers to Discharge: Continued Medical Work up  Expected Discharge Plan and Services In-house Referral: Clinical Social Work Discharge Planning Services: NA Post Acute Care Choice: Home Health, Resumption of Svcs/PTA Provider Living arrangements for the past 2 months: Independent Living Facility                 DME Arranged: N/A DME Agency: NA                   Social Determinants of Health (SDOH) Interventions SDOH Screenings   Food Insecurity: No Food Insecurity (08/27/2023)  Housing: Low Risk  (08/27/2023)  Transportation Needs: No Transportation Needs (08/27/2023)  Recent Concern: Transportation Needs - Unmet Transportation Needs (08/09/2023)   Received from [facility]  Utilities: Not At Risk (08/27/2023)  Tobacco Use: Low Risk   (08/29/2023)    Readmission Risk Interventions    08/28/2023   12:31 PM  Readmission Risk Prevention Plan  Post Dischage Appt Complete  Medication Screening Complete  Transportation Screening Complete

## 2023-09-04 NOTE — Progress Notes (Signed)
    6 Days Post-Op Procedure(s) (LRB): TOTAL HIP REVISION (Right)  Subjective: Patient interactive this AM.  Reports pain as controlled.  Slept okay.  Lying comfortably in bed.  Answering questions appropriately. Endorses pain in the R hip where she had surgery and Left knee where she has known advanced arthritis.  Objective:   VITALS:   Vitals:   09/03/23 1252 09/03/23 2037 09/04/23 0457 09/04/23 0554  BP: 138/73 123/67 (!) 165/74 (!) 119/53  Pulse: 74 74 65   Resp: 20 20 20    Temp: 98.4 F (36.9 C) 98.5 F (36.9 C) 97.9 F (36.6 C)   TempSrc: Oral Oral Oral   SpO2: 97% 100% 100%   Weight:      Height:        AAOx4, resting comfortably, interacts appropriately MSK: RLE Sensation intact distally Intact pulses distally Dorsiflexion/Plantar flexion intact Incision: dressing C/D/I Compartment soft Wiggles toes appropriately Severe valgus deformity bilateral knees, minimal internal rotation of right hip at rest, no significant difference in leg lengths when aligned together, no bony deformity appreciated Tolerates gentle ROM of right hip without significant elicited pain Wound vac maintaining suction at setting, 0cc in cannister   Lab Results  Component Value Date   WBC 11.1 (H) 09/02/2023   HGB 8.2 (L) 09/02/2023   HCT 24.7 (L) 09/02/2023   MCV 85.5 09/02/2023   PLT 84 (L) 09/02/2023   BMET    Component Value Date/Time   NA 134 (L) 09/02/2023 0545   K 3.7 09/02/2023 0545   CL 99 09/02/2023 0545   CO2 26 09/02/2023 0545   GLUCOSE 113 (H) 09/02/2023 0545   BUN 28 (H) 09/02/2023 0545   CREATININE 1.24 (H) 09/02/2023 0545   CALCIUM 8.3 (L) 09/02/2023 0545   GFRNONAA 43 (L) 09/02/2023 0545     Xray: R hip and pelvis  08/29/23: THA revision components in good position, no adverse features 08/31/23: Stable post-operative imaging, no evidence of dislocation or new adverse features   Assessment/Plan: 6 Days Post-Op   Principal Problem:   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   Post op recs: WB: WBAT RLE, posterior precautions x 6 weeks Abx: ancef while in-house, discharge on cefadroxil 500 mg twice daily x 7 days, follow up introap cxs: Final cxs all negative Imaging: PACU pelvis Xray Dressing: Prevena wound VAC keep intact until follow-up in 1 wk DVT prophylaxis: Aspirin 81BID starting POD1 Follow up: 1 week after surgery for a wound check with Dr. Blanchie Dessert at [facility].  Address: [address], [place], [place] [address]  Office Phone: [phone]   Joen Laura 09/04/2023, 6:35 AM   Contact information:   Weekdays 7am-5pm epic message Dr. Blanchie Dessert, or call office for patient follow up: [phone] After hours and holidays please check [email or web address] for group call information for [facility]

## 2023-09-05 LAB — BASIC METABOLIC PANEL
Anion gap: 10 (ref 5–15)
BUN: 26 mg/dL — ABNORMAL HIGH (ref 8–23)
CO2: 24 mmol/L (ref 22–32)
Calcium: 8.5 mg/dL — ABNORMAL LOW (ref 8.9–10.3)
Chloride: 102 mmol/L (ref 98–111)
Creatinine, Ser: 1.05 mg/dL — ABNORMAL HIGH (ref 0.44–1.00)
GFR, Estimated: 52 mL/min — ABNORMAL LOW (ref >60)
Glucose, Bld: 152 mg/dL — ABNORMAL HIGH (ref 70–99)
Potassium: 3.7 mmol/L (ref 3.5–5.1)
Sodium: 136 mmol/L (ref 135–145)

## 2023-09-05 LAB — CBC
HCT: 26.1 % — ABNORMAL LOW (ref 36.0–46.0)
Hemoglobin: 8.9 g/dL — ABNORMAL LOW (ref 12.0–15.0)
MCH: 28.6 pg (ref 26.0–34.0)
MCHC: 34.1 g/dL (ref 30.0–36.0)
MCV: 83.9 fL (ref 80.0–100.0)
Platelets: 150 10*3/uL (ref 150–400)
RBC: 3.11 MIL/uL — ABNORMAL LOW (ref 3.87–5.11)
RDW: 15.4 % (ref 11.5–15.5)
WBC: 9.3 10*3/uL (ref 4.0–10.5)
nRBC: 0 % (ref 0.0–0.2)

## 2023-09-05 NOTE — Plan of Care (Signed)
?  Problem: Nutrition: ?Goal: Adequate nutrition will be maintained ?Outcome: Progressing ?  ?Problem: Elimination: ?Goal: Will not experience complications related to bowel motility ?Outcome: Progressing ?Goal: Will not experience complications related to urinary retention ?Outcome: Progressing ?  ?Problem: Pain Managment: ?Goal: General experience of comfort will improve ?Outcome: Progressing ?  ?  ?

## 2023-09-05 NOTE — Progress Notes (Signed)
7 Days Post-Op Procedure(s) (LRB): TOTAL HIP REVISION (Right)  Subjective: Patient interactive this AM.  Reports pain as controlled.  Slept okay.  Lying comfortably in bed.  Answering questions appropriately. Endorses pain in the R hip where she had surgery.  Participated with OT yesterday.  Awaiting SNF placement.  No other complaints.  Objective:   VITALS:   Vitals:   09/04/23 0554 09/04/23 1254 09/04/23 1937 09/05/23 0546  BP: (!) 119/53 (!) 152/75 131/75 (!) 188/92  Pulse:  69 78 66  Resp:  20 17 16   Temp:  97.8 F (36.6 C) 98 F (36.7 C) 98.2 F (36.8 C)  TempSrc:   Oral Oral  SpO2:  100% 99% 99%  Weight:      Height:        AAOx4, resting comfortably, interacts appropriately MSK: RLE Sensation intact distally Intact pulses distally Dorsiflexion/Plantar flexion intact Incision: dressing C/D/I Compartment soft Wiggles toes appropriately Severe valgus deformity bilateral knees, minimal internal rotation of right hip at rest, no significant difference in leg lengths when aligned together, no bony deformity appreciated Tolerates gentle ROM of right hip without significant elicited pain Wound vac maintaining suction at setting, 0cc in cannister -- removed and Aquacel placed   Lab Results  Component Value Date   WBC 11.1 (H) 09/02/2023   HGB 8.2 (L) 09/02/2023   HCT 24.7 (L) 09/02/2023   MCV 85.5 09/02/2023   PLT 84 (L) 09/02/2023   BMET    Component Value Date/Time   NA 134 (L) 09/02/2023 0545   K 3.7 09/02/2023 0545   CL 99 09/02/2023 0545   CO2 26 09/02/2023 0545   GLUCOSE 113 (H) 09/02/2023 0545   BUN 28 (H) 09/02/2023 0545   CREATININE 1.24 (H) 09/02/2023 0545   CALCIUM 8.3 (L) 09/02/2023 0545   GFRNONAA 43 (L) 09/02/2023 0545     Xray: R hip and pelvis  08/29/23: THA revision components in good position, no adverse features 08/31/23: Stable post-operative imaging, no evidence of dislocation or new adverse  features   Assessment/Plan: 7 Days Post-Op   Principal Problem:   Chronic hip pain after total replacement of right hip joint Active Problems:   Benign essential tremor   History of hyperlipidemia   Hypertension   Thrombocytopenia (HCC)   Stage 3b chronic kidney disease (HCC)   S/p R THA revision 08/29/23  Narrative: 08/31/23: Hgb 9.7, generally stable.  More interactive today.  Plan to mobilize with PT if able.  PT note 08/30/23 mentioned severe pain with RLE movement and internal rotation of RLE, no obvious rotation on exam today and tolerated gentle ROM without significant pain.  Discussed with Dr. Blanchie Dessert.  If difficulty mobilizing with PT, plan for repeat X-ray for further evaluation.  Anticipate likely SNF placement upon DC.   09/01/23: Labs pending.  Pt resting in bed, more interactive again today.  Increased appetite, smiling and laughing.  X-rays reassuring from yesterday.  No evidence of dislocation/complication.  Continue with PT/OT -- DC plan of SNF vs back to ILF with HHPT based PT/OT eval today.     09/02/23: Hgb stable around 8.2, likely post-operative anemia, though mildly anemic prior to surgery.  WBC 11.1.  Cr 1.24 and GFR 43, generally stable with her CKD.  Still difficulty with PT though pt hopeful.  Continue with PT/OT.   09/03/23: Unable to meet with PT/OT yesterday, hopeful for today.  Likely recommendation of SNF upon DC.  10/15: Slow progress with physical therapy, plan  to discharge to SNF when bed available.  10/16: POD7, woundvac removed, mepilex applied.  Worked with OT yesterday.  Plan to discharge to SNF when bed available.   Post op recs: WB: WBAT RLE, posterior precautions x 6 weeks Abx: ancef while in-house, discharge on cefadroxil 500 mg twice daily x 7 days, follow up introap cxs: Final cxs all negative Imaging: PACU pelvis Xray Dressing: Aquacel until follow-up in 1 wk DVT prophylaxis: Aspirin 81BID starting POD1 Follow up: 2 weeks after  surgery for a wound check with Dr. Bl[facility]essert at [facility].  Address: [address], [place], [place] [address]  Office Phone: [phone]   Cecil Cobbs 09/05/2023, 8:10 AM   Contact information:   Weekdays 7am-5pm epic message Dr. Blanchie Dessert, or call office for patient follow up: [phone] After hours and holidays please check [email or web address] for group call information for [facility]

## 2023-09-05 NOTE — Progress Notes (Signed)
Physical Therapy Treatment Patient Details Name: Sheri Brown MRN: [id number] DOB: [birth year] Today's Date: 09/05/2023   History of Present Illness Sheri Brown is an 87 y.o. female with a history of right hip arthroplasty ~2010, HFpEF, orthostatic hypotension who presented to the ED on 08/27/2023 with right hip pain, inability to ambulate, admitted by orthopedic surgery for failure of right  prosthesis, requiring revision on 08/29/23 of right with explant and revision of the acetabular component as well as the ball and liner, retention of the femoral stem.    PT Comments  Pt with significant improvement in cognition this session, alert and engaging in activity, able to attend to tasks. Pt needing mod A+2 to mobilize to EOB, significantly increased time and effort to mobilize to EOB. Pt needing mod A+2 to power up to stand from elevated EOB, verbal cues for LE activation and hand placement. Pt able to take short steps from bed over to recliner, short slow steps with minimal foot clearance, inching each foot forward 1-2 inches at a time. Once in recliner, pt engages in exercises, good muscle activation noted, assist for hip abd/add and SLR. Pt needing initial education of posterior hip precautions and constant verbal cues throughout mobility to respect posterior hip precautions. Pt's daughter present throughout session, discussed mobility and physical assistance needed; pt will benefit from continued inpatient follow up therapy, <3 hours/day.     If plan is discharge home, recommend the following: Two people to help with walking and/or transfers;Two people to help with bathing/dressing/bathroom;Assistance with feeding;Help with stairs or ramp for entrance;Assist for transportation   Can travel by private vehicle     No  Equipment Recommendations  None recommended by PT    Recommendations for Other Services       Precautions / Restrictions Precautions Precautions: Posterior Hip Precaution  Booklet Issued: Yes (comment) Precaution Comments: reviewed posterior hip precautions, recalls 0/3 Restrictions Weight Bearing Restrictions: No RLE Weight Bearing: Weight bearing as tolerated     Mobility  Bed Mobility Overal bed mobility: Needs Assistance Bed Mobility: Supine to Sit     Supine to sit: Mod assist, +2 for physical assistance     General bed mobility comments: mod A+2 to inch BLE over to EOB and upright trunk into sitting, significantly increased time and effort, verbal cues for posterior hip precautions wtih mobility    Transfers Overall transfer level: Needs assistance Equipment used: Rolling walker (2 wheels) Transfers: Sit to/from Stand Sit to Stand: Mod assist, +2 safety/equipment, From elevated surface           General transfer comment: mod A+2 for safety to power up from elevated bed, verbal cues for posterior hip precautions, LE activation to power up    Ambulation/Gait Ambulation/Gait assistance: Mod assist, +2 safety/equipment Gait Distance (Feet): 6 Feet Assistive device: Rolling walker (2 wheels) Gait Pattern/deviations: Step-to pattern, Decreased weight shift to right Gait velocity: decreased     General Gait Details: mod A+2 for safety, pt taking very slow, steps minimally clearing each foot and progressing 1-2 inches at a time forward, heavy reliance on RW for UE support to deweight RLE   Stairs             Wheelchair Mobility     Tilt Bed    Modified Rankin (Stroke Patients Only)       Balance Overall balance assessment: Needs assistance Sitting-balance support: Feet supported Sitting balance-Leahy Scale: Fair     Standing balance support: Reliant on assistive device for balance, During  functional activity, Bilateral upper extremity supported Standing balance-Leahy Scale: Poor                              Cognition Arousal: Alert Behavior During Therapy: WFL for tasks assessed/performed Overall  Cognitive Status: Within Functional Limits for tasks assessed                                 General Comments: pt more alert, following commands consistently, able to attend to tasks well        Exercises Total Joint Exercises Ankle Circles/Pumps: Seated, AROM, Both, 20 reps Quad Sets: Seated, AROM, Strengthening, Right, 20 reps (tactile cues behind R knee for muscle activation) Hip ABduction/ADduction: Seated, AAROM, Right, 10 reps, Strengthening Straight Leg Raises: Seated, AAROM, Strengthening, Right, 10 reps    General Comments        Pertinent Vitals/Pain Pain Assessment Pain Assessment: 0-10 Pain Score: 5  Pain Location: R hip Pain Descriptors / Indicators: Grimacing, Guarding Pain Intervention(s): Limited activity within patient's tolerance, Monitored during session, Repositioned    Home Living                          Prior Function            PT Goals (current goals can now be found in the care plan section) Acute Rehab PT Goals Patient Stated Goal: to walk PT Goal Formulation: With family Time For Goal Achievement: 09/13/23 Potential to Achieve Goals: Fair Progress towards PT goals: Progressing toward goals    Frequency    Min 1X/week      PT Plan      Co-evaluation              AM-PAC PT "6 Clicks" Mobility   Outcome Measure  Help needed turning from your back to your side while in a flat bed without using bedrails?: A Lot Help needed moving from lying on your back to sitting on the side of a flat bed without using bedrails?: A Lot Help needed moving to and from a bed to a chair (including a wheelchair)?: Total Help needed standing up from a chair using your arms (e.g., wheelchair or bedside chair)?: Total Help needed to walk in hospital room?: Total Help needed climbing 3-5 steps with a railing? : Total 6 Click Score: 8    End of Session Equipment Utilized During Treatment: Gait belt Activity Tolerance:  Patient tolerated treatment well Patient left: in chair;with call bell/phone within reach;with chair alarm set;with family/visitor present Nurse Communication: Mobility status;Other (comment) (BP 129/67) PT Visit Diagnosis: Unsteadiness on feet (R26.81);History of falling (Z91.81);Difficulty in walking, not elsewhere classified (R26.2);Pain Pain - Right/Left: Right Pain - part of body: Hip     Time: 6045-4098 PT Time Calculation (min) (ACUTE ONLY): 54 min  Charges:    $Gait Training: 8-22 mins $Therapeutic Exercise: 8-22 mins $Therapeutic Activity: 23-37 mins PT General Charges $$ ACUTE PT VISIT: 1 Visit                     Tori Keya Wynes PT, DPT 09/05/23, 1:04 PM

## 2023-09-05 NOTE — Progress Notes (Signed)
Physical Therapy Treatment Patient Details Name: Sheri Brown MRN: [id number] DOB: 1936/03/15 Today's Date: 09/05/2023   History of Present Illness Sheri Brown is an 87 y.o. female with a history of right hip arthroplasty ~2010, HFpEF, orthostatic hypotension who presented to the ED on 08/27/2023 with right hip pain, inability to ambulate, admitted by orthopedic surgery for failure of right  prosthesis, requiring revision on 08/29/23 of right with explant and revision of the acetabular component as well as the ball and liner, retention of the femoral stem.    PT Comments  Assisted patient to stand from recliner and made small step to turn to bed, used bilateral arm hold for pivot. Patient tolerated well. Continue PT. Patient will benefit from continued inpatient follow up therapy, <3 hours/day.    If plan is discharge home, recommend the following: Two people to help with walking and/or transfers;Two people to help with bathing/dressing/bathroom;Assistance with feeding;Help with stairs or ramp for entrance;Assist for transportation   Can travel by private vehicle     No  Equipment Recommendations  None recommended by PT    Recommendations for Other Services       Precautions / Restrictions Precautions Precautions: Posterior Hip Precaution Booklet Issued: Yes (comment) Precaution Comments: reviewed posterior hip precautions, recalls 0/3 Restrictions Weight Bearing Restrictions: No RLE Weight Bearing: Weight bearing as tolerated     Mobility  Bed Mobility   Bed Mobility: Sit to Supine Rolling: +2 for safety/equipment, +2 for physical assistance, Total assist         General bed mobility comments: assit both legs and trunk back to supine.    Transfers Overall transfer level: Needs assistance Equipment used: 2 person hand held assist Transfers: Sit to/from Stand, Bed to chair/wheelchair/BSC Sit to Stand: Max assist, +2 physical assistance, +2 safety/equipment   Step pivot  transfers: Max assist       General transfer comment: max to stand from recliner with +2 arm hold, patient able to step with left foot to turn to bed and each back with LUE to bed    Ambulation/Gait                   Stairs             Wheelchair Mobility     Tilt Bed    Modified Rankin (Stroke Patients Only)       Balance                                            Cognition Arousal: Alert Behavior During Therapy: WFL for tasks assessed/performed Overall Cognitive Status: Within Functional Limits for tasks assessed                                          Exercises      General Comments        Pertinent Vitals/Pain Pain Assessment Faces Pain Scale: Hurts little more Pain Location: R hip Pain Descriptors / Indicators: Grimacing, Guarding Pain Intervention(s): Monitored during session, Repositioned    Home Living                          Prior Function            PT Goals (current  goals can now be found in the care plan section) Acute Rehab PT Goals Patient Stated Goal: to walk PT Goal Formulation: With family Time For Goal Achievement: 09/13/23 Potential to Achieve Goals: Fair Progress towards PT goals: Progressing toward goals    Frequency    Min 1X/week      PT Plan      Co-evaluation              AM-PAC PT "6 Clicks" Mobility   Outcome Measure  Help needed turning from your back to your side while in a flat bed without using bedrails?: A Lot Help needed moving from lying on your back to sitting on the side of a flat bed without using bedrails?: A Lot Help needed moving to and from a bed to a chair (including a wheelchair)?: Total Help needed standing up from a chair using your arms (e.g., wheelchair or bedside chair)?: Total Help needed to walk in hospital room?: Total Help needed climbing 3-5 steps with a railing? : Total 6 Click Score: 8    End of Session  Equipment Utilized During Treatment: Gait belt Activity Tolerance: Patient tolerated treatment well Patient left: in bed;with bed alarm set;with call bell/phone within reach;with nursing/sitter in room Nurse Communication: Mobility status PT Visit Diagnosis: Unsteadiness on feet (R26.81);History of falling (Z91.81);Difficulty in walking, not elsewhere classified (R26.2);Pain Pain - Right/Left: Right Pain - part of body: Hip     Time: 0347-4259 PT Time Calculation (min) (ACUTE ONLY): 11 min  Charges:    $ $Therapeutic Activity: 8-22 mins PT General Charges $$ ACUTE PT VISIT: 1 Visit                     Blanchard Kelch PT Acute Rehabilitation Services Office [phone] Weekend [phone]    Rada Hay 09/05/2023, 4:27 PM

## 2023-09-05 NOTE — TOC Progression Note (Addendum)
Transition of Care [facility]) - Progression Note    Patient Details  Name: Sheri Brown MRN: [id number] Date of Birth: 12/22/1935  Transition of [facility]) CM/SW Contact  Otelia Santee, LCSW Phone Number: 09/05/2023, 2:30 PM  Clinical Narrative:    Information faxed in for review for insurance authorization. Awaiting approval.  ADDENDUM: Pt's insurance has been approved fro SNF placement at [facility] able to accept pt tomorrow.    Expected Discharge Plan: Home w Home Health Services Barriers to Discharge: Continued Medical Work up  Expected Discharge Plan and Services In-house Referral: Clinical Social Work Discharge Planning Services: NA Post Acute Care Choice: Home Health, Resumption of Svcs/PTA Provider Living arrangements for the past 2 months: Independent Living Facility                 DME Arranged: N/A DME Agency: NA                   Social Determinants of Health (SDOH) Interventions SDOH Screenings   Food Insecurity: No Food Insecurity (08/27/2023)  Housing: Low Risk  (08/27/2023)  Transportation Needs: No Transportation Needs (08/27/2023)  Recent Concern: Transportation Needs - Unmet Transportation Needs (08/09/2023)   Received from [facility]  Utilities: Not At Risk (08/27/2023)  Tobacco Use: Low Risk  (08/29/2023)    Readmission Risk Interventions    08/28/2023   12:31 PM  Readmission Risk Prevention Plan  Post Dischage Appt Complete  Medication Screening Complete  Transportation Screening Complete

## 2023-09-06 NOTE — Plan of Care (Signed)

## 2023-09-06 NOTE — Progress Notes (Signed)
8 Days Post-Op Procedure(s) (LRB): TOTAL HIP REVISION (Right)  Subjective: Patient interactive this AM.  Reports pain as controlled.  Slept okay.  Lying comfortably in bed.  Answering questions appropriately. Endorses some pain in the R hip where she had surgery.  Participated with PT yesterday.  Likely DC to SNF today, patient eager  No other complaints.  Objective:   VITALS:   Vitals:   09/05/23 1700 09/05/23 1832 09/05/23 1924 09/06/23 0541  BP: (!) 164/102 131/64 134/78 (!) 152/74  Pulse:   84 66  Resp:   19 19  Temp:   98.1 F (36.7 C) 98.6 F (37 C)  TempSrc:   Oral Oral  SpO2:   100% 99%  Weight:      Height:        AAOx4, resting comfortably, interacts appropriately MSK: RLE Sensation intact distally Intact pulses distally Dorsiflexion/Plantar flexion intact Incision: dressing C/D/I (Aquacel) Compartment soft Wiggles toes appropriately Severe valgus deformity bilateral knees, minimal internal rotation of right hip at rest, no significant difference in leg lengths when aligned together, no bony deformity appreciated Tolerates gentle ROM of right hip without significant elicited pain   Lab Results  Component Value Date   WBC 9.3 09/05/2023   HGB 8.9 (L) 09/05/2023   HCT 26.1 (L) 09/05/2023   MCV 83.9 09/05/2023   PLT 150 09/05/2023   BMET    Component Value Date/Time   NA 136 09/05/2023 0945   K 3.7 09/05/2023 0945   CL 102 09/05/2023 0945   CO2 24 09/05/2023 0945   GLUCOSE 152 (H) 09/05/2023 0945   BUN 26 (H) 09/05/2023 0945   CREATININE 1.05 (H) 09/05/2023 0945   CALCIUM 8.5 (L) 09/05/2023 0945   GFRNONAA 52 (L) 09/05/2023 0945     Xray: R hip and pelvis  08/29/23: THA revision components in good position, no adverse features 08/31/23: Stable post-operative imaging, no evidence of dislocation or new adverse features   Assessment/Plan: 8 Days Post-Op   Principal Problem:   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  More interactive today.  Plan to mobilize with PT if able.  PT note 08/30/23 mentioned severe pain with RLE movement and internal rotation of RLE, no obvious rotation on exam today and tolerated gentle ROM without significant pain.  Discussed with Dr. Blanchie Dessert.  If difficulty mobilizing with PT, plan for repeat X-ray for further evaluation.  Anticipate likely SNF placement upon DC.   09/01/23: Labs pending.  Pt resting in bed, more interactive again today.  Increased appetite, smiling and laughing.  X-rays reassuring from yesterday.  No evidence of dislocation/complication.  Continue with PT/OT -- DC plan of SNF vs back to ILF with HHPT based PT/OT eval today.     09/02/23: Hgb stable around 8.2, likely post-operative anemia, though mildly anemic prior to surgery.  WBC 11.1.  Cr 1.24 and GFR 43, generally stable with her CKD.  Still difficulty with PT though pt hopeful.  Continue with PT/OT.   09/03/23: Unable to meet with PT/OT yesterday, hopeful for today.  Likely recommendation of SNF upon DC.  10/15: Slow progress with physical therapy, plan to discharge to SNF when bed available.  10/16: POD7, woundvac removed, mepilex applied.  Worked  with OT yesterday.  Plan to discharge to SNF when bed available.  10/17: Hgb yesterday 8.9, generally stable.  Mild hyperglycemia waning and waxing between 89-152, no official history of diabetes per patient but noted blood sugar levels of similar range since 08/10/23 per record review.  Recommend f/u with PCP for further evaluation/management.  PT went okay yesterday.  Anticipate DC to SNF today.     Post op recs: WB: WBAT RLE, posterior precautions x 6 weeks Abx: ancef while in-house, discharge on cefadroxil 500 mg  twice daily x 7 days, follow up introap cxs: Final cxs all negative Imaging: PACU pelvis Xray Dressing: Aquacel until follow-up in 1 wk DVT prophylaxis: Aspirin 81BID starting POD1 Follow up: 2 weeks after surgery for a wound check with Dr. Blanchie Dessert at [facility].  Address: [address], [place], [place] [address]  Office Phone: [phone]   Cecil Cobbs 09/06/2023, 7:15 AM   Contact information:   Weekdays 7am-5pm epic message Dr. Blanchie Dessert, or call office for patient follow up: [phone] After hours and holidays please check [email or web address] for group call information for [facility]

## 2023-09-06 NOTE — TOC Transition Note (Signed)
Transition of Care [facility]) - CM/SW Discharge Note   Patient Details  Name: Sheri Brown MRN: [id number] Date of Birth: Mar 11, 1936  Transition of Care [facility]) CM/SW Contact:  Otelia Santee, LCSW Phone Number: 09/06/2023, 3:46 PM   Clinical Narrative:    Pt to discharge to [facility] for SNF. Pt will be going to room 114. RN to call report to [phone]. Spoke with pt's daughter who is agreeable to this plan. DC packet placed at RN station. PTAR called for transportation.    Final next level of care: Skilled Nursing Facility Barriers to Discharge: Barriers Resolved   Patient Goals and CMS Choice CMS Medicare.gov Compare Post Acute Care list provided to:: Patient Choice offered to / list presented to : Patient  Discharge Placement     Existing PASRR number confirmed : 09/02/23          Patient chooses bed at: [facility] Patient to be transferred to facility by: PTAR Name of family member notified: Lauris Poag Patient and family notified of of transfer: 09/06/23  Discharge Plan and Services Additional resources added to the After Visit Summary for   In-house Referral: Clinical Social Work Discharge Planning Services: NA Post Acute Care Choice: Home Health, Resumption of Svcs/PTA Provider          DME Arranged: N/A DME Agency: NA                  Social Determinants of Health (SDOH) Interventions SDOH Screenings   Food Insecurity: No Food Insecurity (08/27/2023)  Housing: Low Risk  (08/27/2023)  Transportation Needs: No Transportation Needs (08/27/2023)  Recent Concern: Transportation Needs - Unmet Transportation Needs (08/09/2023)   Received from [facility]  Utilities: Not At Risk (08/27/2023)  Tobacco Use: Low Risk  (08/29/2023)     Readmission Risk Interventions    09/06/2023    3:42 PM 08/28/2023   12:31 PM  Readmission Risk Prevention Plan  Post Dischage Appt  Complete  Medication Screening  Complete  Transportation Screening Complete  Complete  PCP or Specialist Appt within 5-7 Days Complete   Home Care Screening Complete   Medication Review (RN CM) Complete

## 2023-09-06 NOTE — Plan of Care (Signed)

## 2023-09-06 NOTE — Discharge Summary (Signed)
Physician Discharge Summary  Patient ID: Sheri Brown MRN: [id number] DOB/AGE: Dec 18, 1935 87 y.o.  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  Discharge Diagnoses:  Principal Problem:   Chronic hip pain after total replacement of right hip joint Active Problems:   Benign essential tremor   History of hyperlipidemia   Hypertension   Thrombocytopenia (HCC)   Stage 3b chronic kidney disease (HCC)   Past Medical History:  Diagnosis Date   Anemia    Arthritis    OA hands   Benign essential tremor 08/24/2017   Complication of anesthesia    "doesnt need alot of anesthesia"   GERD (gastroesophageal reflux disease)    History of hyperlipidemia    Hypertension 03/06/2016   Infected sebaceous cyst    Memory difficulties 08/24/2017   Orthostatic hypotension    PONV (postoperative nausea and vomiting)    Right inguinal hernia 09/07/2017   Sickle cell trait (HCC)    Thrombocytopathia (HCC)    Vitamin D deficiency    Weight loss     Surgeries: Procedure(s): TOTAL HIP REVISION on 08/29/2023   Consultants (if any): Treatment Team:  Tyrone Nine, MD  Discharged Condition: Improved  Hospital Course: Sheri Brown is an 87 y.o. female who was admitted 08/27/2023 with a diagnosis of Chronic hip pain after total replacement of right hip joint after failure of prosthesis and went to the operating room on 08/29/2023 and underwent the above named procedures.  Required revision of right hip prosthesis with explant and revision of the acetabular component as well as the ball and liner, with retention of the femoral stem.  Patient had some difficulty with mobilization and ambulation following surgery and has been working with PT and OT.  Initially placed wound vac, and after one week was transitioned to Aquacel bandage.   Repeat x-rays after surgery demonstrated hardware in good position and maintained alignment.     She was given perioperative antibiotics.  She has also been given post-operative antibiotics of Cefadroxil to be taken for 1 week post-operatively.     Anti-infectives (From admission, onward)    Start     Dose/Rate Route Frequency Ordered Stop   09/01/23 2200  cefadroxil (DURICEF) capsule 500 mg        500 mg Oral 2 times daily 08/29/23 1700 09/08/23 2159   08/31/23 0000  cefadroxil (DURICEF) 500 MG capsule        500 mg Oral 2 times daily 08/31/23 0706 09/07/23 2359   08/29/23 2200  ceFAZolin (ANCEF) IVPB 2g/100 mL premix        2 g 200 mL/hr over 30 Minutes Intravenous Every 8 hours 08/29/23 1700 09/01/23 1456   08/29/23 0600  ceFAZolin (ANCEF) IVPB 2g/100 mL premix        2 g 200 mL/hr over 30 Minutes Intravenous On call to O.R. 08/28/23 1941 08/29/23 1207     .  She was given sequential compression devices, early ambulation, and Aspirin for DVT prophylaxis.  She benefited maximally from the hospital stay and there were no complications.  Some mild hyperglycemia was appreciated in patient's labs, waxing and waning pre-surgery and post-surgery.  Patient has remained asymptomatic.  Recommend follow up with primary care for further evaluation and management.  Patient also to follow up with Dr. Blanchie Dessert in the office 2 weeks from surgery for repeat evaluation, wound check, and possible repeat x-rays.    Recent vital signs:  Vitals:   09/06/23 0541 09/06/23 1323  BP: (!) 152/74 125/71  Pulse: 66 72  Resp: 19 17  Temp: 98.6 F (37 C) (!) 97.4 F (36.3 C)  SpO2: 99% 98%    Recent laboratory studies:  Lab Results  Component Value Date   HGB 8.9 (L) 09/05/2023   HGB 8.2 (L) 09/02/2023   HGB 8.1 (L) 09/01/2023   Lab Results  Component Value Date   WBC 9.3 09/05/2023   PLT 150 09/05/2023   No results found for: "INR" Lab Results  Component Value Date   NA 136 09/05/2023   K 3.7 09/05/2023   CL 102 09/05/2023   CO2 24 09/05/2023   BUN 26 (H) 09/05/2023    CREATININE 1.05 (H) 09/05/2023   GLUCOSE 152 (H) 09/05/2023    Discharge Medications:   Allergies as of 09/06/2023       Reactions   Aricept [donepezil Hcl] Nausea Only   Dizziness   Diazepam Other (See Comments)   Mental disturbance.        Medication List     TAKE these medications    acetaminophen 500 MG tablet Commonly known as: TYLENOL Take 500 mg by mouth in the morning and at bedtime.   aspirin EC 81 MG tablet Take 1 tablet (81 mg total) by mouth 2 (two) times daily for 28 days. Swallow whole.   cefadroxil 500 MG capsule Commonly known as: DURICEF Take 1 capsule (500 mg total) by mouth 2 (two) times daily for 7 days.   furosemide 20 MG tablet Commonly known as: LASIX Take 20 mg by mouth daily.   lidocaine 5 % Commonly known as: LIDODERM Place 1 patch onto the skin daily as needed (Pain). Remove & Discard patch within 12 hours or as directed by MD   metoprolol succinate 25 MG 24 hr tablet Commonly known as: Toprol XL Take 1 tablet (25 mg total) by mouth daily. What changed:  how much to take when to take this   midodrine 2.5 MG tablet Commonly known as: PROAMATINE Take 1 tablet (2.5 mg total) by mouth 2 (two) times daily as needed (orthostatic hypotension). What changed:  when to take this reasons to take this   multivitamin with minerals tablet Take 1 tablet by mouth daily. Centrum Senior   oxyCODONE 5 MG immediate release tablet Commonly known as: Roxicodone Take 0.5-1 tablets (2.5-5 mg total) by mouth every 4 (four) hours as needed for up to 7 days for severe pain or moderate pain.   Vitamin D3 50 MCG (2000 UT) Tabs Take 2,000 Units by mouth daily.        Diagnostic Studies: DG HIP UNILAT WITH PELVIS 2-3 VIEWS RIGHT  Result Date: 08/31/2023 CLINICAL DATA:  Post right hip surgery.  Difficulty with ambulation. EXAM: DG HIP (WITH OR WITHOUT PELVIS) 2-3V RIGHT COMPARISON:  08/29/2023 FINDINGS: Stable appearance and alignment of right hip  arthroplasty. The femoral component is well seated in the acetabular component. No periprosthetic fracture or lucency. No evidence of acute fracture. Pubic rami are intact. The bones are subjectively under mineralized. IMPRESSION: Right hip arthroplasty without complication. Stable alignment from prior. Electronically Signed   By: Narda Rutherford M.D.   On: 08/31/2023 16:26   DG HIP UNILAT W OR W/O PELVIS 2-3 VIEWS RIGHT  Result Date: 08/29/2023 CLINICAL DATA:  Postoperative state. EXAM: DG HIP (WITH OR WITHOUT PELVIS) 2-3V RIGHT COMPARISON:  CT right hip 08/28/2023, pelvis and left hip radiographs 08/28/2023 FINDINGS: There is revision of  the prior acetabular cup, now with 3 screws fixating into the right acetabulum and compared to a single screw previously. The right femoral head prosthesis is now appropriately located within the center of the acetabular cup. Mild lucency along the lesser trochanter border with the proximal femoral stem is similar to prior CT. Moderate left femoroacetabular joint space narrowing. IMPRESSION: Revision of the prior right acetabular cup and liner, with right femoral head prosthesis now appropriately located within the center of the acetabular cup. Electronically Signed   By: Neita Garnet M.D.   On: 08/29/2023 17:51   DG Pelvis Portable  Result Date: 08/29/2023 CLINICAL DATA:  Intraoperative right hip replacement EXAM: PORTABLE PELVIS 1-2 VIEWS COMPARISON:  08/28/2023 FINDINGS: Limited intraoperative view the pelvis obtained during revision of right hip replacement. No fracture is seen. IMPRESSION: Limited intraoperative view during revision of right hip replacement. Electronically Signed   By: Jasmine Pang M.D.   On: 08/29/2023 17:29   DG Knee 1-2 Views Left  Result Date: 08/28/2023 CLINICAL DATA:  Left knee pain. EXAM: LEFT KNEE - 1-2 VIEW COMPARISON:  None Available. FINDINGS: No acute fracture or dislocation. Small to moderate joint effusion. Tricompartmental  degenerative changes with joint space narrowing and bulky marginal osteophytes, severe in the lateral compartment. Mild genu valgus deformity. Soft tissues are unremarkable. IMPRESSION: 1. Advanced tricompartmental osteoarthritis, worst in the lateral compartment. Electronically Signed   By: Obie Dredge M.D.   On: 08/28/2023 12:05   DG HIP UNILAT WITH PELVIS 2-3 VIEWS LEFT  Result Date: 08/28/2023 CLINICAL DATA:  Left hip pain. EXAM: DG HIP (WITH OR WITHOUT PELVIS) 2-3V LEFT COMPARISON:  Right hip x-rays dated October 15, 2019. FINDINGS: No acute fracture or dislocation. Similar mild left hip joint space narrowing with small marginal acetabular osteophytes. Right total hip arthroplasty again noted with new asymmetric positioning of the femoral head component within the acetabular cup. There is also faintly increased mineralization in the right hip joint inferior to the femoral neck. IMPRESSION: 1. Similar mild left hip osteoarthritis. 2. Right total hip arthroplasty with evidence of liner wear/loosening and likely pseudotumor. Electronically Signed   By: Obie Dredge M.D.   On: 08/28/2023 12:02   CT HIP RIGHT WO CONTRAST  Result Date: 08/28/2023 CLINICAL DATA:  Right hip pain.  History of prior arthroplasty. EXAM: CT OF THE RIGHT HIP WITHOUT CONTRAST TECHNIQUE: Multidetector CT imaging of the right hip was performed according to the standard protocol. Multiplanar CT image reconstructions were also generated. RADIATION DOSE REDUCTION: This exam was performed according to the departmental dose-optimization program which includes automated exposure control, adjustment of the mA and/or kV according to patient size and/or use of iterative reconstruction technique. COMPARISON:  Right hip x-rays dated October 15, 2019. FINDINGS: Bones/Joint/Cartilage Prior right hip total arthroplasty. New asymmetric positioning of the femoral head prosthesis within the acetabular cup. New mild lucency along the medial  aspect of the proximal femoral stem (series 6, image 46). No fracture or dislocation. Prominent nutrient foramen along the anterior proximal subtrochanteric femoral cortex. Moderate joint effusion with increased mineralization. Ligaments Ligaments are suboptimally evaluated by CT. Muscles and Tendons Prominent distention of the right iliopsoas bursa with increased mineralization. Soft tissue No fluid collection or hematoma.  No soft tissue mass. IMPRESSION: 1. Prior right hip total arthroplasty with new asymmetric positioning of the femoral head prosthesis within the acetabular cup and new mild lucency along the medial aspect of the proximal femoral stem, concerning for polyethylene liner wear and loosening, respectively. 2.  Moderate right hip joint effusion and prominent right iliopsoas bursitis with increased mineralization, consistent with pseudotumor. Electronically Signed   By: Obie Dredge M.D.   On: 08/28/2023 11:59    Disposition: Discharge disposition: 03-Skilled Nursing Facility       Discharge Instructions     Call MD / Call 911   Complete by: As directed    If you experience chest pain or shortness of breath, CALL 911 and be transported to the hospital emergency room.  If you develope a fever above 101 F, pus (white drainage) or increased drainage or redness at the wound, or calf pain, call your surgeon's office.   Constipation Prevention   Complete by: As directed    Drink plenty of fluids.  Prune juice may be helpful.  You may use a stool softener, such as Colace (over the counter) 100 mg twice a day.  Use MiraLax (over the counter) for constipation as needed.   Diet - low sodium heart healthy   Complete by: As directed    Follow the hip precautions as taught in Physical Therapy   Complete by: As directed    Increase activity slowly as tolerated   Complete by: As directed    Post-operative opioid taper instructions:   Complete by: As directed    POST-OPERATIVE OPIOID TAPER  INSTRUCTIONS: It is important to wean off of your opioid medication as soon as possible. If you do not need pain medication after your surgery it is ok to stop day one. Opioids include: Codeine, Hydrocodone(Norco, Vicodin), Oxycodone(Percocet, oxycontin) and hydromorphone amongst others.  Long term and even short term use of opiods can cause: Increased pain response Dependence Constipation Depression Respiratory depression And more.  Withdrawal symptoms can include Flu like symptoms Nausea, vomiting And more Techniques to manage these symptoms Hydrate well Eat regular healthy meals Stay active Use relaxation techniques(deep breathing, meditating, yoga) Do Not substitute Alcohol to help with tapering If you have been on opioids for less than two weeks and do not have pain than it is ok to stop all together.  Plan to wean off of opioids This plan should start within one week post op of your joint replacement. Maintain the same interval or time between taking each dose and first decrease the dose.  Cut the total daily intake of opioids by one tablet each day Next start to increase the time between doses. The last dose that should be eliminated is the evening dose.      TED hose   Complete by: As directed    Use stockings (TED hose) for 2 weeks on both leg(s).  Then for 2 more weeks on the surgical leg.  You may remove them at night for sleeping.        Contact information for follow-up providers     Joen Laura, MD Follow up in 1 week(s).   Specialty: Orthopedic Surgery Contact information: [address] [place] [address] [phone]              Contact information for after-discharge care     Destination     [facility] Preferred SNF .   Service: Skilled Nursing Contact information: [address] [place] [address] [phone]                        Discharge Instructions       INSTRUCTIONS AFTER JOINT REPLACEMENT   Remove items at home  which could result in a fall. This includes throw rugs or furniture in walking pathways ICE to the affected joint every three hours while awake for 30 minutes at a time, for at least the first 3-5 days, and then as needed for pain and swelling.  Continue to use ice for pain and swelling. You may notice swelling that will progress down to the foot and ankle.  This is normal after surgery.  Elevate your leg when you are not up walking on it.   Continue to use the breathing machine you got in the hospital (incentive spirometer) which will help keep your temperature down.  It is common for your temperature to cycle up and down following surgery, especially at night when you are not up moving around and exerting yourself.  The breathing machine keeps your lungs expanded and your temperature down.  DIET:  As you were doing prior to hospitalization, we recommend a well-balanced diet.  DRESSING / WOUND CARE / SHOWERING:  Keep the surgical dressing until follow up.  The dressing is water-resistant, please keep this dry when showering.  Do not submerge the dressing.  IF THE DRESSING FALLS OFF or the wound gets wet inside, change the dressing with sterile gauze.  Please use good hand washing techniques before changing the dressing.  Do not use any lotions or creams on the incision until instructed by your surgeon.    ACTIVITY  Increase activity slowly as tolerated, but follow the weight bearing instructions below.   No driving for 6 weeks or until further direction given by your physician.  You cannot drive while taking narcotics.  No lifting or carrying greater than 10 lbs. until further directed by your surgeon. Avoid periods of inactivity such as sitting longer than an hour when not asleep. This helps prevent blood clots.  You may return to work once you are authorized by your doctor.   WEIGHT BEARING: Weight bearing as tolerated with assist  device (walker, cane, etc) as directed, use it as long as suggested by your surgeon or therapist, typically at least 4-6 weeks.  EXERCISES  Results after joint replacement surgery are often greatly improved when you follow the exercise, range of motion and muscle strengthening exercises prescribed by your doctor. Safety measures are also important to protect the joint from further injury. Any time any of these exercises cause you to have increased pain or swelling, decrease what you are doing until you are comfortable again and then slowly increase them. If you have problems or questions, call your caregiver or physical therapist for advice.   Rehabilitation is important following a joint replacement. After just a few days of immobilization, the muscles of the leg can become weakened and shrink (atrophy).  These exercises are designed to build up the tone and strength of the thigh and leg muscles and to improve motion. Often times heat used for twenty to thirty minutes before working out will loosen up your tissues and help with improving the range of motion but do not use heat for the first two weeks following surgery (sometimes heat can increase post-operative swelling).   These exercises can be done on a training (exercise) mat, on the floor, on a table or on a bed. Use whatever works the best and is most comfortable for you.    Use music or television while you are exercising so that the exercises are a pleasant break in your day. This will make your life better with the exercises acting as a  break in your routine that you can look forward to.   Perform all exercises about fifteen times, three times per day or as directed.  You should exercise both the operative leg and the other leg as well.  Exercises include:   Quad Sets - Tighten up the muscle on the front of the thigh (Quad) and hold for 5-10 seconds.   Straight Leg Raises - With your knee straight (if you were given a brace, keep it on), lift  the leg to 60 degrees, hold for 3 seconds, and slowly lower the leg.  Perform this exercise against resistance later as your leg gets stronger.  Leg Slides: Lying on your back, slowly slide your foot toward your buttocks, bending your knee up off the floor (only go as far as is comfortable). Then slowly slide your foot back down until your leg is flat on the floor again.  Angel Wings: Lying on your back spread your legs to the side as far apart as you can without causing discomfort.  Hamstring Strength:  Lying on your back, push your heel against the floor with your leg straight by tightening up the muscles of your buttocks.  Repeat, but this time bend your knee to a comfortable angle, and push your heel against the floor.  You may put a pillow under the heel to make it more comfortable if necessary.   A rehabilitation program following joint replacement surgery can speed recovery and prevent re-injury in the future due to weakened muscles. Contact your doctor or a physical therapist for more information on knee rehabilitation.   CONSTIPATION:  Constipation is defined medically as fewer than three stools per week and severe constipation as less than one stool per week.  Even if you have a regular bowel pattern at home, your normal regimen is likely to be disrupted due to multiple reasons following surgery.  Combination of anesthesia, postoperative narcotics, change in appetite and fluid intake all can affect your bowels.   YOU MUST use at least one of the following options; they are listed in order of increasing strength to get the job done.  They are all available over the counter, and you may need to use some, POSSIBLY even all of these options:    Drink plenty of fluids (prune juice may be helpful) and high fiber foods Colace 100 mg by mouth twice a day  Senokot for constipation as directed and as needed Dulcolax (bisacodyl), take with full glass of water  Miralax (polyethylene glycol) once or twice  a day as needed.  If you have tried all these things and are unable to have a bowel movement in the first 3-4 days after surgery call either your surgeon or your primary doctor.    If you experience loose stools or diarrhea, hold the medications until you stool forms back up.  If your symptoms do not get better within 1 week or if they get worse, check with your doctor.  If you experience "the worst abdominal pain ever" or develop nausea or vomiting, please contact the office immediately for further recommendations for treatment.  ITCHING:  If you experience itching with your medications, try taking only a single pain pill, or even half a pain pill at a time.  You can also use Benadryl over the counter for itching or also to help with sleep.   TED HOSE STOCKINGS:  Use stockings on both legs until for at least 2 weeks or as directed by physician office. They may  be removed at night for sleeping.  MEDICATIONS:  See your medication summary on the "After Visit Summary" that nursing will review with you.  You may have some home medications which will be placed on hold until you complete the course of blood thinner medication.  It is important for you to complete the blood thinner medication as prescribed.  Blood clot prevention (DVT Prophylaxis): After surgery you are at an increased risk for a blood clot. you were prescribed a blood thinner, Aspirin 81mg  XXX, to be taken twice daily for a total of 4 weeks from surgery to help reduce your risk of getting a blood clot.  Signs of a pulmonary embolus (blood clot in the lungs) include sudden short of breath, feeling lightheaded or dizzy, chest pain with a deep breath, rapid pulse rapid breathing.  Signs of a blood clot in your arms or legs include new unexplained swelling and cramping, warm, red or darkened skin around the painful area.  Please call the office or 911 right away if these signs or symptoms develop.  PRECAUTIONS:   If you experience chest pain or  shortness of breath - call 911 immediately for transfer to the hospital emergency department.   If you develop a fever greater that 101 F, purulent drainage from wound, increased redness or drainage from wound, foul odor from the wound/dressing, or calf pain - CONTACT YOUR SURGEON.                                                   FOLLOW-UP APPOINTMENTS:  If you do not already have a post-op appointment, please call the office for an appointment to be seen by your surgeon.  Guidelines for how soon to be seen are listed in your "After Visit Summary", but are typically between 2-3 weeks after surgery.  If you have a specialized bandage, you may be told to follow up 1 week after surgery.  It is also important to follow up with your primary care with regards to your mildly elevated blood sugar over the last month for evaluation and further treatment.  POST-OPERATIVE OPIOID TAPER INSTRUCTIONS: It is important to wean off of your opioid medication as soon as possible. If you do not need pain medication after your surgery it is ok to stop day one. Opioids include: Codeine, Hydrocodone(Norco, Vicodin), Oxycodone(Percocet, oxycontin) and hydromorphone amongst others.  Long term and even short term use of opiods can cause: Increased pain response Dependence Constipation Depression Respiratory depression And more.  Withdrawal symptoms can include Flu like symptoms Nausea, vomiting And more Techniques to manage these symptoms Hydrate well Eat regular healthy meals Stay active Use relaxation techniques(deep breathing, meditating, yoga) Do Not substitute Alcohol to help with tapering If you have been on opioids for less than two weeks and do not have pain than it is ok to stop all together.  Plan to wean off of opioids This plan should start within one week post op of your joint replacement. Maintain the same interval or time between taking each dose and first decrease the dose.  Cut the total  daily intake of opioids by one tablet each day Next start to increase the time between doses. The last dose that should be eliminated is the evening dose.   MAKE SURE YOU:  Understand these instructions.  Get help right away if you  are not doing well or get worse.    Thank you for letting us be a part of your medical care team.  It is a privilege we respect greatly.  We hope these instructions will help you stay on track for a fast and full recovery!           Signed: Cecil Cobbs 09/06/2023, 3:16 PM

## 2023-09-06 NOTE — Progress Notes (Signed)
Occupational Therapy Treatment Patient Details Name: Sheri Brown MRN: [id number] DOB: [birth year] Today's Date: 09/06/2023   History of present illness Sheri Brown is an 87 y.o. female with a history of right hip arthroplasty ~2010, HFpEF, orthostatic hypotension who presented to the ED on 08/27/2023 with right hip pain, inability to ambulate, admitted by orthopedic surgery for failure of right  prosthesis, requiring revision on 08/29/23 of right with explant and revision of the acetabular component as well as the ball and liner, retention of the femoral stem.   OT comments  Pt in bed upon therapy arrival and agreeable to participate in OT treatment session with focus on pt education, bed mobility, sit<>stand transitions, and functional transfers in order to increase functional performance during self care tasks and increase overall independence. Pt requires increased time to complete all mobility tasks while VC are provided for technique/sequencing. Initially demonstrated difficulty with proper standing posture presenting with severe posterior lean. Was able to correct posterior lean for 2nd trail and transfer to recliner with 2 person assist.       If plan is discharge home, recommend the following:  Two people to help with walking and/or transfers;A lot of help with bathing/dressing/bathroom;Assistance with cooking/housework;Help with stairs or ramp for entrance;Assist for transportation   Equipment Recommendations  Other (comment) (defer to next venue of care)       Precautions / Restrictions Precautions Precautions: Posterior Hip Precaution Booklet Issued: Yes (comment) Precaution Comments: Verbal education provided regarding posterior hip precautions. Handout provided for reference. Pt verbalized understanding. Restrictions Weight Bearing Restrictions: No RLE Weight Bearing: Weight bearing as tolerated       Mobility Bed Mobility Overal bed mobility: Needs Assistance Bed Mobility:  Supine to Sit     Supine to sit: Mod assist, HOB elevated, Used rails     General bed mobility comments: VC for technique and sequencing. Increased time to complete task with periodic tactile cues along with VC to complete transition. Provided some assist with scooting hips towards EOB using bed pad.    Transfers Overall transfer level: Needs assistance Equipment used: Rolling walker (2 wheels) Transfers: Sit to/from Stand, Bed to chair/wheelchair/BSC Sit to Stand: Max assist, +2 physical assistance, +2 safety/equipment, From elevated surface     Step pivot transfers: Mod assist, +2 physical assistance, +2 safety/equipment     General transfer comment: Completed 2 sit to stand transitions. Initially demonstrated strong posterior lean and was unable to self correct when VC were provided. VC were provided for hand placement with RW management prior to each sit to stand. Prior to second attempt, OT provided visual demonstration of sit to stand transition and pt was able to complete and achieve an upright standing posterior. Intermittent VC were provided to adjust if needed. With increased time, VC, and min physical assist to manage RW, pt was able to demonstrate lateral side step pivot transfer from bed to recliner moving towards the right. Difficulty with weightshifting in order to move LEs during transfer d/t decreased strength.     Balance Overall balance assessment: Needs assistance Sitting-balance support: Feet supported Sitting balance-Leahy Scale: Fair Sitting balance - Comments: sitting EOB   Standing balance support: Reliant on assistive device for balance, During functional activity, Bilateral upper extremity supported Standing balance-Leahy Scale: Poor Standing balance comment: Requires external support            ADL either performed or assessed with clinical judgement   ADL Overall ADL's : Needs assistance/impaired      Lower Body  Dressing: Total assistance;Bed  level         Cognition Arousal: Alert Behavior During Therapy: WFL for tasks assessed/performed Overall Cognitive Status: Within Functional Limits for tasks assessed      Following Commands: Follows one step commands consistently, Follows one step commands with increased time           Exercises General Exercises - Lower Extremity Ankle Circles/Pumps: AAROM, AROM, Both, 5 reps, Supine Heel Slides: AAROM, Both, AROM, 5 reps, Supine Hip Flexion/Marching: PROM, Both, 5 reps, Supine       General Comments Surgical dressing intact during session.    Pertinent Vitals/ Pain       Pain Assessment Pain Assessment: Faces Faces Pain Scale: No hurt         Frequency  Min 1X/week        Progress Toward Goals  OT Goals(current goals can now be found in the care plan section)  Progress towards OT goals: Progressing toward goals            AM-PAC OT "6 Clicks" Daily Activity     Outcome Measure   Help from another person eating meals?: A Little Help from another person taking care of personal grooming?: A Little Help from another person toileting, which includes using toliet, bedpan, or urinal?: Total Help from another person bathing (including washing, rinsing, drying)?: A Lot Help from another person to put on and taking off regular upper body clothing?: A Lot Help from another person to put on and taking off regular lower body clothing?: Total 6 Click Score: 12    End of Session Equipment Utilized During Treatment: Rolling walker (2 wheels);Gait belt  OT Visit Diagnosis: Muscle weakness (generalized) (M62.81);Pain;Other abnormalities of gait and mobility (R26.89) Pain - Right/Left: Right Pain - part of body: Leg   Activity Tolerance Patient tolerated treatment well   Patient Left in chair;with call bell/phone within reach;with chair alarm set   Nurse Communication Mobility status        Time: 2725-3664 OT Time Calculation (min): 35 min  Charges: OT  General Charges $OT Visit: 1 Visit OT Treatments $Self Care/Home Management : 8-22 mins $Therapeutic Activity: 8-22 mins  Limmie Patricia, OTR/L,CBIS  Supplemental OT - [facility] and [facility] Secure Chat Preferred    Ritter Helsley, Charisse March 09/06/2023, 2:08 PM

## 2023-09-12 NOTE — Plan of Care (Signed)
[facility] Tonsillectomy/Adenoidectomy, Postoperative PEDS care plan entered in error.

## 2023-09-25 ENCOUNTER — Ambulatory Visit: Payer: Medicare Other | Admitting: Podiatry

## 2023-11-27 ENCOUNTER — Ambulatory Visit: Payer: Medicare Other | Admitting: Podiatry

## 2024-03-18 ENCOUNTER — Encounter: Payer: Self-pay | Admitting: Podiatry

## 2024-03-18 ENCOUNTER — Ambulatory Visit: Payer: Medicare Other | Admitting: Podiatry

## 2024-03-18 DIAGNOSIS — M79676 Pain in unspecified toe(s): Secondary | ICD-10-CM | POA: Diagnosis not present

## 2024-03-18 DIAGNOSIS — I739 Peripheral vascular disease, unspecified: Secondary | ICD-10-CM | POA: Diagnosis not present

## 2024-03-18 DIAGNOSIS — B351 Tinea unguium: Secondary | ICD-10-CM

## 2024-03-18 NOTE — Progress Notes (Signed)
  Subjective:  Patient ID: Sheri Brown, female    DOB: 03-04-1936,  MRN: [id number]  Sheri Brown presents to clinic today for at risk foot care. Patient has h/o PAD and painful elongated mycotic toenails 1-5 bilaterally which are tender when wearing enclosed shoe gear. Pain is relieved with periodic professional debridement.  Chief Complaint  Patient presents with   Routine Foot Care    RFC/not diabetic/no pain   New problem(s): None.   PCP is Jenell Mirza, PA-C.  Allergies  Allergen Reactions   Aricept  [Donepezil  Hcl] Nausea Only    Dizziness   Diazepam Other (See Comments)    Mental disturbance.    Review of Systems: Negative except as noted in the HPI.  Objective: No changes noted in today's physical examination. There were no vitals filed for this visit. Jared Sasek is a pleasant 88 y.o. female in NAD. AAO x 3.  Vascular Examination: CFT <3 seconds b/l. DP pulses faintly palpable b/l. PT pulses nonpalpable b/l. Digital hair absent. Skin temperature gradient warm to warm b/l. No pain with calf compression. No ischemia or gangrene. No cyanosis or clubbing noted b/l. Dependent edema noted b/l LE.   Neurological Examination: Sensation grossly intact b/l with 10 gram monofilament. Vibratory sensation intact b/l.   Dermatological Examination: Pedal skin warm and supple b/l. No open wounds b/l. No interdigital macerations. Toenails 1-5 b/l thick, discolored, elongated with subungual debris and pain on dorsal palpation.  No hyperkeratotic nor porokeratotic lesions present on today's visit.  Musculoskeletal Examination: Muscle strength 5/5 to all lower extremity muscle groups bilaterally. HAV with bunion deformity noted b/l LE. Hammertoe deformity noted 2-5 b/l.  Radiographs: None  Assessment/Plan: 1. Pain due to onychomycosis of toenail   2. PAD (peripheral artery disease) (HCC)   Patient was evaluated and treated. All patient's and/or POA's questions/concerns  addressed on today's visit. Toenails 1-5 debrided in length and girth without incident. Continue foot and shoe inspections daily. Monitor blood glucose per PCP/Endocrinologist's recommendations. Continue soft, supportive shoe gear daily. Report any pedal injuries to medical professional. Call office if there are any questions/concerns. -Patient/POA to call should there be question/concern in the interim.   Return in about 3 months (around 06/17/2024).  Sheri Brown, DPM      [place] LOCATION: [address], [place] [address]                   Office [phone]   [facility] LOCATION: [address] [place], [place] [address] Office [phone]

## 2024-06-24 ENCOUNTER — Ambulatory Visit (INDEPENDENT_AMBULATORY_CARE_PROVIDER_SITE_OTHER): Admitting: Podiatry

## 2024-06-24 ENCOUNTER — Encounter: Payer: Self-pay | Admitting: Podiatry

## 2024-06-24 DIAGNOSIS — Z91198 Patient's noncompliance with other medical treatment and regimen for other reason: Secondary | ICD-10-CM

## 2024-06-25 NOTE — Progress Notes (Signed)
 1. Failure to attend appointment with reason given    Appt canceled by clinic. Power outage in office.

## 2024-10-08 ENCOUNTER — Ambulatory Visit (INDEPENDENT_AMBULATORY_CARE_PROVIDER_SITE_OTHER): Admitting: Podiatry

## 2024-10-08 ENCOUNTER — Encounter: Payer: Self-pay | Admitting: Podiatry

## 2024-10-08 DIAGNOSIS — B351 Tinea unguium: Secondary | ICD-10-CM

## 2024-10-08 DIAGNOSIS — Q828 Other specified congenital malformations of skin: Secondary | ICD-10-CM

## 2024-10-08 DIAGNOSIS — M79676 Pain in unspecified toe(s): Secondary | ICD-10-CM

## 2024-10-08 DIAGNOSIS — I739 Peripheral vascular disease, unspecified: Secondary | ICD-10-CM | POA: Diagnosis not present

## 2024-10-17 ENCOUNTER — Encounter: Payer: Self-pay | Admitting: Podiatry

## 2024-10-17 NOTE — Progress Notes (Signed)
  Subjective:  Patient ID: Sheri Brown, female    DOB: July 07, 1936,  MRN: [id number]  Sheri Brown presents to clinic today for at risk foot care. Patient has h/o PAD and painful thick toenails that are difficult to trim. Pain interferes with ambulation. Aggravating factors include wearing enclosed shoe gear. Pain is relieved with periodic professional debridement.  Chief Complaint  Patient presents with   RFC     RFC Non diabetic toenail trim and callus care. LOV with PCP ?   New problem(s): None.   PCP is Trudy Elodia PARAS, PA-C.  Allergies  Allergen Reactions   Aricept  [Donepezil  Hcl] Nausea Only    Dizziness   Diazepam Other (See Comments)    Mental disturbance.    Review of Systems: Negative except as noted in the HPI.  Objective: No changes noted in today's physical examination. There were no vitals filed for this visit. Sheri Brown is a pleasant 88 y.o. female WD, WN in NAD. AAO x 3.  Vascular Examination: CFT <3 seconds b/l. DP pulses faintly palpable b/l. PT pulses nonpalpable b/l. Digital hair absent. Skin temperature gradient warm to warm b/l. No pain with calf compression. No ischemia or gangrene. No cyanosis or clubbing noted b/l. Dependent edema noted b/l LE.   Neurological Examination: Sensation grossly intact b/l with 10 gram monofilament. Vibratory sensation intact b/l.   Dermatological Examination: Pedal skin warm and supple b/l. No open wounds b/l. No interdigital macerations. Toenails 1-5 b/l thick, discolored, elongated with subungual debris and pain on dorsal palpation.  Porokeratotic lesion(s) distal tip of right 2nd toe. No erythema, no edema, no drainage, no fluctuance.  Musculoskeletal Examination: Muscle strength 5/5 to all lower extremity muscle groups bilaterally. HAV with bunion deformity noted b/l LE. Hammertoe deformity noted 2-5 b/l.  Radiographs: None  Assessment/Plan: 1. Pain due to onychomycosis of toenail   2. Porokeratosis   3. PAD  (peripheral artery disease)   Consent given for treatment. Patient examined. All patient's and/or POA's questions/concerns addressed on today's visit. Mycotic toenails 1-5 b/l debrided in length and girth without incident. Porokeratotic lesion(s) distal tip of right 2nd toe pared and enucleated with sharp debridement without incident.Continue daily foot inspections and monitor blood glucose per PCP/Endocrinologist's recommendations. Continue soft, supportive shoe gear daily. Report any pedal injuries to medical professional. Call office if there are any quesitons/concerns. Return in about 3 months (around 01/08/2025).  Delon LITTIE Merlin, DPM       LOCATION: [address], [place] [address]                   Office [phone]   [facility] LOCATION: [address] [place], [place] [address] Office [phone]

## 2025-01-28 ENCOUNTER — Ambulatory Visit: Admitting: Podiatry
